# Patient Record
Sex: Male | Born: 1945 | Race: White | Hispanic: No | Marital: Single | Smoking: Never smoker
Health system: Southern US, Community
[De-identification: ages and names within clinical notes are randomized; demographics above are authoritative.]

## PROBLEM LIST (undated history)

## (undated) DIAGNOSIS — D649 Anemia, unspecified: Secondary | ICD-10-CM

## (undated) DIAGNOSIS — I428 Other cardiomyopathies: Secondary | ICD-10-CM

## (undated) DIAGNOSIS — I5022 Chronic systolic (congestive) heart failure: Secondary | ICD-10-CM

## (undated) DIAGNOSIS — K5730 Diverticulosis of large intestine without perforation or abscess without bleeding: Secondary | ICD-10-CM

## (undated) DIAGNOSIS — K529 Noninfective gastroenteritis and colitis, unspecified: Secondary | ICD-10-CM

## (undated) DIAGNOSIS — I5042 Chronic combined systolic (congestive) and diastolic (congestive) heart failure: Secondary | ICD-10-CM

## (undated) DIAGNOSIS — I2510 Atherosclerotic heart disease of native coronary artery without angina pectoris: Secondary | ICD-10-CM

## (undated) DIAGNOSIS — F8081 Childhood onset fluency disorder: Secondary | ICD-10-CM

## (undated) DIAGNOSIS — K631 Perforation of intestine (nontraumatic): Secondary | ICD-10-CM

## (undated) HISTORY — PX: APPENDECTOMY: SHX54

## (undated) HISTORY — DX: Anemia, unspecified: D64.9

## (undated) HISTORY — DX: Chronic systolic (congestive) heart failure: I50.22

## (undated) HISTORY — DX: Atherosclerotic heart disease of native coronary artery without angina pectoris: I25.10

## (undated) HISTORY — DX: Other cardiomyopathies: I42.8

## (undated) HISTORY — DX: Perforation of intestine (nontraumatic): K63.1

## (undated) HISTORY — DX: Noninfective gastroenteritis and colitis, unspecified: K52.9

## (undated) HISTORY — PX: EYE SURGERY: SHX253

## (undated) HISTORY — DX: Diverticulosis of large intestine without perforation or abscess without bleeding: K57.30

## (undated) HISTORY — DX: Chronic combined systolic (congestive) and diastolic (congestive) heart failure: I50.42

---

## 2008-06-15 ENCOUNTER — Emergency Department: Payer: Self-pay | Admitting: *Deleted

## 2008-09-08 ENCOUNTER — Ambulatory Visit: Payer: Self-pay | Admitting: Ophthalmology

## 2009-09-28 ENCOUNTER — Ambulatory Visit: Payer: Self-pay | Admitting: Ophthalmology

## 2016-03-09 ENCOUNTER — Inpatient Hospital Stay
Admission: EM | Admit: 2016-03-09 | Discharge: 2016-03-11 | DRG: 392 | Disposition: A | Discharge status: Home / Self Care | Payer: Medicare Other | Attending: Internal Medicine | Admitting: Internal Medicine

## 2016-03-09 DIAGNOSIS — R109 Unspecified abdominal pain: Secondary | ICD-10-CM | POA: Diagnosis present

## 2016-03-09 DIAGNOSIS — K5720 Diverticulitis of large intestine with perforation and abscess without bleeding: Secondary | ICD-10-CM

## 2016-03-09 DIAGNOSIS — R319 Hematuria, unspecified: Secondary | ICD-10-CM | POA: Diagnosis present

## 2016-03-09 DIAGNOSIS — K529 Noninfective gastroenteritis and colitis, unspecified: Secondary | ICD-10-CM | POA: Diagnosis not present

## 2016-03-09 DIAGNOSIS — E860 Dehydration: Secondary | ICD-10-CM | POA: Diagnosis present

## 2016-03-09 DIAGNOSIS — Z23 Encounter for immunization: Secondary | ICD-10-CM

## 2016-03-09 DIAGNOSIS — N390 Urinary tract infection, site not specified: Secondary | ICD-10-CM | POA: Diagnosis present

## 2016-03-09 DIAGNOSIS — Z809 Family history of malignant neoplasm, unspecified: Secondary | ICD-10-CM

## 2016-03-09 LAB — CBC
HCT: 46.8 % (ref 40.0–52.0)
Hemoglobin: 15.5 g/dL (ref 13.0–18.0)
MCH: 28.3 pg (ref 26.0–34.0)
MCHC: 33 g/dL (ref 32.0–36.0)
MCV: 85.9 fL (ref 80.0–100.0)
PLATELETS: 317 10*3/uL (ref 150–440)
RBC: 5.45 MIL/uL (ref 4.40–5.90)
RDW: 13.4 % (ref 11.5–14.5)
WBC: 14.1 10*3/uL — AB (ref 3.8–10.6)

## 2016-03-09 LAB — COMPREHENSIVE METABOLIC PANEL
ALK PHOS: 70 U/L (ref 38–126)
ALT: 54 U/L (ref 17–63)
AST: 48 U/L — AB (ref 15–41)
Albumin: 3.9 g/dL (ref 3.5–5.0)
Anion gap: 10 (ref 5–15)
BILIRUBIN TOTAL: 1.4 mg/dL — AB (ref 0.3–1.2)
BUN: 44 mg/dL — AB (ref 6–20)
CHLORIDE: 105 mmol/L (ref 101–111)
CO2: 24 mmol/L (ref 22–32)
CREATININE: 1.46 mg/dL — AB (ref 0.61–1.24)
Calcium: 9 mg/dL (ref 8.9–10.3)
GFR calc Af Amer: 55 mL/min — ABNORMAL LOW (ref >60)
GFR, EST NON AFRICAN AMERICAN: 47 mL/min — AB (ref >60)
Glucose, Bld: 148 mg/dL — ABNORMAL HIGH (ref 65–99)
Potassium: 4.4 mmol/L (ref 3.5–5.1)
Sodium: 139 mmol/L (ref 135–145)
TOTAL PROTEIN: 7.8 g/dL (ref 6.5–8.1)

## 2016-03-09 LAB — LIPASE, BLOOD: Lipase: 17 U/L (ref 11–51)

## 2016-03-09 NOTE — ED Notes (Signed)
Reports several episodes over the past several weeks with abdominal pain, vomiting and "belching".  Reports this episode has been going on for several days.  Patient has been exposed to the flu.

## 2016-03-09 NOTE — ED Notes (Signed)
Patient called for triage by this RN. No answer. Will reattempt.

## 2016-03-09 NOTE — ED Notes (Signed)
Patient called the second time for triage. No response. RN will reattempt to call patient one final time prior to eloping from the status board.

## 2016-03-10 ENCOUNTER — Emergency Department: Payer: Medicare Other

## 2016-03-10 DIAGNOSIS — E860 Dehydration: Secondary | ICD-10-CM | POA: Diagnosis present

## 2016-03-10 DIAGNOSIS — Z23 Encounter for immunization: Secondary | ICD-10-CM | POA: Diagnosis not present

## 2016-03-10 DIAGNOSIS — R319 Hematuria, unspecified: Secondary | ICD-10-CM | POA: Diagnosis present

## 2016-03-10 DIAGNOSIS — K529 Noninfective gastroenteritis and colitis, unspecified: Secondary | ICD-10-CM | POA: Diagnosis present

## 2016-03-10 DIAGNOSIS — R109 Unspecified abdominal pain: Secondary | ICD-10-CM | POA: Diagnosis present

## 2016-03-10 DIAGNOSIS — Z809 Family history of malignant neoplasm, unspecified: Secondary | ICD-10-CM | POA: Diagnosis not present

## 2016-03-10 DIAGNOSIS — N390 Urinary tract infection, site not specified: Secondary | ICD-10-CM | POA: Diagnosis present

## 2016-03-10 LAB — URINALYSIS COMPLETE WITH MICROSCOPIC (ARMC ONLY)
BILIRUBIN URINE: NEGATIVE
GLUCOSE, UA: NEGATIVE mg/dL
Ketones, ur: NEGATIVE mg/dL
Nitrite: POSITIVE — AB
PH: 6 (ref 5.0–8.0)
Protein, ur: 30 mg/dL — AB
Specific Gravity, Urine: 1.051 — ABNORMAL HIGH (ref 1.005–1.030)

## 2016-03-10 LAB — RAPID INFLUENZA A&B ANTIGENS (ARMC ONLY)
INFLUENZA A (ARMC): NEGATIVE
INFLUENZA B (ARMC): NEGATIVE

## 2016-03-10 MED ORDER — PIPERACILLIN-TAZOBACTAM 3.375 G IVPB
3.3750 g | Freq: Once | INTRAVENOUS | Status: AC
  Administered 2016-03-10: 3.375 g via INTRAVENOUS
  Filled 2016-03-10: qty 50

## 2016-03-10 MED ORDER — METOCLOPRAMIDE HCL 5 MG/ML IJ SOLN
10.0000 mg | Freq: Once | INTRAMUSCULAR | Status: AC
  Administered 2016-03-10: 10 mg via INTRAVENOUS
  Filled 2016-03-10: qty 2

## 2016-03-10 MED ORDER — SODIUM CHLORIDE 0.9% FLUSH
3.0000 mL | Freq: Two times a day (BID) | INTRAVENOUS | Status: DC
  Administered 2016-03-11: 3 mL via INTRAVENOUS

## 2016-03-10 MED ORDER — CIPROFLOXACIN IN D5W 400 MG/200ML IV SOLN
400.0000 mg | Freq: Two times a day (BID) | INTRAVENOUS | Status: DC
  Administered 2016-03-10 – 2016-03-11 (×3): 400 mg via INTRAVENOUS
  Filled 2016-03-10 (×4): qty 200

## 2016-03-10 MED ORDER — MORPHINE SULFATE (PF) 4 MG/ML IV SOLN
4.0000 mg | Freq: Once | INTRAVENOUS | Status: AC
  Administered 2016-03-10: 4 mg via INTRAVENOUS
  Filled 2016-03-10: qty 1

## 2016-03-10 MED ORDER — PNEUMOCOCCAL VAC POLYVALENT 25 MCG/0.5ML IJ INJ
0.5000 mL | INJECTION | INTRAMUSCULAR | Status: AC
  Administered 2016-03-11: 0.5 mL via INTRAMUSCULAR
  Filled 2016-03-10: qty 0.5

## 2016-03-10 MED ORDER — ACETAMINOPHEN 650 MG RE SUPP: 650.0000 mg | Freq: Four times a day (QID) | RECTAL | Status: DC | PRN

## 2016-03-10 MED ORDER — ACETAMINOPHEN 325 MG PO TABS: 650.0000 mg | ORAL_TABLET | Freq: Four times a day (QID) | ORAL | Status: DC | PRN

## 2016-03-10 MED ORDER — ONDANSETRON HCL 4 MG/2ML IJ SOLN
4.0000 mg | Freq: Four times a day (QID) | INTRAMUSCULAR | Status: DC | PRN
  Administered 2016-03-10: 4 mg via INTRAVENOUS
  Filled 2016-03-10: qty 2

## 2016-03-10 MED ORDER — SODIUM CHLORIDE 0.9 % IV SOLN
Freq: Once | INTRAVENOUS | Status: AC
  Administered 2016-03-10: via INTRAVENOUS

## 2016-03-10 MED ORDER — ONDANSETRON HCL 4 MG PO TABS: 4.0000 mg | ORAL_TABLET | Freq: Four times a day (QID) | ORAL | Status: DC | PRN

## 2016-03-10 MED ORDER — METRONIDAZOLE IN NACL 5-0.79 MG/ML-% IV SOLN
500.0000 mg | Freq: Three times a day (TID) | INTRAVENOUS | Status: DC
  Administered 2016-03-10 – 2016-03-11 (×4): 500 mg via INTRAVENOUS
  Filled 2016-03-10 (×7): qty 100

## 2016-03-10 MED ORDER — INFLUENZA VAC SPLIT QUAD 0.5 ML IM SUSY
0.5000 mL | PREFILLED_SYRINGE | INTRAMUSCULAR | Status: AC
  Administered 2016-03-11: 0.5 mL via INTRAMUSCULAR
  Filled 2016-03-10: qty 0.5

## 2016-03-10 MED ORDER — IOHEXOL 240 MG/ML SOLN
25.0000 mL | Freq: Once | INTRAMUSCULAR | Status: AC | PRN
  Administered 2016-03-10: 25 mL via ORAL

## 2016-03-10 MED ORDER — SODIUM CHLORIDE 0.9 % IV SOLN
INTRAVENOUS | Status: DC
  Administered 2016-03-10 – 2016-03-11 (×3): via INTRAVENOUS

## 2016-03-10 MED ORDER — IOHEXOL 300 MG/ML  SOLN
80.0000 mL | Freq: Once | INTRAMUSCULAR | Status: AC | PRN
  Administered 2016-03-10: 80 mL via INTRAVENOUS

## 2016-03-10 MED ORDER — ONDANSETRON HCL 4 MG/2ML IJ SOLN
4.0000 mg | Freq: Once | INTRAMUSCULAR | Status: AC
  Administered 2016-03-10: 4 mg via INTRAVENOUS
  Filled 2016-03-10: qty 2

## 2016-03-10 MED ORDER — MORPHINE SULFATE (PF) 2 MG/ML IV SOLN: 2.0000 mg | INTRAVENOUS | Status: DC | PRN

## 2016-03-10 NOTE — ED Notes (Signed)
Patient transported to CT 

## 2016-03-10 NOTE — ED Notes (Signed)
CT techs notified that pt finished drinking contrast.

## 2016-03-10 NOTE — ED Provider Notes (Signed)
[facility] Emergency Department Provider Note  ____________________________________________  Time seen: Approximately 12:11 AM  I have reviewed the triage vital signs and the nursing notes.   HISTORY  Chief Complaint Abdominal Pain and Emesis    HPI William Holland is a 70 y.o. male who complains of abdominal pain off and on for about a week vomiting occasional cough and diarrhea chills some blood in the urine as well. Patient has been unable to keep anything down for at least 24 hours. Brother provides most of the history since says the patient last Food down on Monday. His last stool was yesterday. Pain is supposed to be 10 out of 10 left-sided abdominal pain and middle abdominal pain   History reviewed. No pertinent past medical history. Patient with no past medical history no hypertension diabetes asthma etc. Patient Active Problem List   Diagnosis Date Noted  . Abdominal pain 03/10/2016  . Colitis 03/10/2016    Past Surgical History  Procedure Laterality Date  . Appendectomy    . Eye surgery      Current Outpatient Rx  Name  Route  Sig  Dispense  Refill  . acetaminophen (TYLENOL) 325 MG tablet   Oral   Take 650 mg by mouth every 6 (six) hours as needed.         . ciprofloxacin (CIPRO) 500 MG tablet   Oral   Take 1 tablet (500 mg total) by mouth 2 (two) times daily.   16 tablet   0   . metroNIDAZOLE (FLAGYL) 500 MG tablet   Oral   Take 1 tablet (500 mg total) by mouth every 8 (eight) hours.   24 tablet   0     Allergies Review of patient's allergies indicates no known allergies.  Family History  Problem Relation Age of Onset  . Cancer Brother   . Cancer Father    Family history is significant for renal stones. Social History Social History  Substance Use Topics  . Smoking status: Never Smoker   . Smokeless tobacco: None  . Alcohol Use: No   patient is currently living with the brother's son. He occasionally does live with  the brother. Brother is here with him now.  Review of Systems Constitutional:fever/chills Eyes: No visual changes. ENT: No sore throat. Cardiovascular: Denies chest pain. Respiratory: Denies shortness of breath. Gastrointestinal: See history of present illness Genitourinary: See history of present illness Musculoskeletal: Negative for back pain. Skin: Negative for rash. Neurological: Negative for headaches, focal weakness or numbness.  10-point ROS otherwise negative.  ____________________________________________   PHYSICAL EXAM:  VITAL SIGNS: ED Triage Vitals  Enc Vitals Group     BP 03/09/16 1915 118/81 mmHg     Pulse Rate 03/09/16 1915 117     Resp 03/09/16 1915 24     Temp 03/09/16 1915 97.6 F (36.4 C)     Temp Source 03/09/16 1915 Oral     SpO2 03/09/16 1920 94 %     Weight 03/09/16 1915 210 lb (95.255 kg)     Height 03/09/16 1915 5' 6"  (1.676 m)     Head Cir --      Peak Flow --      Pain Score 03/09/16 1917 10     Pain Loc --      Pain Edu? --      Excl. in [place]? --     Constitutional: Alert and oriented. Well appearing and in no acute distress. Eyes: Conjunctivae are normal. PERRL. EOMI.  Head: Atraumatic. Nose: No congestion/rhinnorhea. Mouth/Throat: Mucous membranes are dry.  Oropharynx non-erythematous. Neck: No stridor.  Cardiovascular: Normal rate, regular rhythm. Grossly normal heart sounds.  Good peripheral circulation. Respiratory: Normal respiratory effort.  No retractions. Lungs CTAB. Gastrointestinal: Soft bowel sounds somewhat decreased is tenderness to palpation percussion in the middle and left abdomen. No distention. No abdominal bruits. No CVA tenderness. Musculoskeletal: No lower extremity tenderness nor edema.  No joint effusions. Neurologic:  Normal speech and language. No gross focal neurologic deficits are appreciated. No gait instability. Skin:  Skin is warm, dry and intact. No rash  noted.   ____________________________________________   LABS (all labs ordered are listed, but only abnormal results are displayed)  Labs Reviewed  COMPREHENSIVE METABOLIC PANEL - Abnormal; Notable for the following:    Glucose, Bld 148 (*)    BUN 44 (*)    Creatinine, Ser 1.46 (*)    AST 48 (*)    Total Bilirubin 1.4 (*)    GFR calc non Af Amer 47 (*)    GFR calc Af Amer 55 (*)    All other components within normal limits  CBC - Abnormal; Notable for the following:    WBC 14.1 (*)    All other components within normal limits  URINALYSIS COMPLETEWITH MICROSCOPIC ([facility] ONLY) - Abnormal; Notable for the following:    Color, Urine AMBER (*)    APPearance TURBID (*)    Specific Gravity, Urine 1.051 (*)    Hgb urine dipstick 1+ (*)    Protein, ur 30 (*)    Nitrite POSITIVE (*)    Leukocytes, UA 3+ (*)    Bacteria, UA MANY (*)    Squamous Epithelial / LPF 0-5 (*)    All other components within normal limits  BASIC METABOLIC PANEL - Abnormal; Notable for the following:    Glucose, Bld 113 (*)    BUN 37 (*)    Calcium 7.8 (*)    GFR calc non Af Amer 58 (*)    Anion gap 2 (*)    All other components within normal limits  CBC - Abnormal; Notable for the following:    RBC 4.20 (*)    Hemoglobin 12.4 (*)    HCT 36.4 (*)    All other components within normal limits  RAPID INFLUENZA A&B ANTIGENS ([facility] ONLY)  URINE CULTURE  LIPASE, BLOOD   ____________________________________________  EKG   ____________________________________________  RADIOLOGY  IMPRESSION: 1. Apparent mild more focal wall thickening along the proximal to mid sigmoid colon, with vague adjacent nodular density in the overlying fat, measuring approximately 3.2 cm. An underlying mass cannot be excluded. Colonoscopy would be helpful for further evaluation. 2. Suggestion of wall thickening along the descending and proximal sigmoid colon, which may reflect an infectious or inflammatory colitis. Scattered  diverticulosis along the descending and sigmoid colon. 3. Small bilateral renal cysts noted. 4. Mildly enlarged prostate. 5. Small amount of air within the bladder may reflect prior Foley catheter placement. Several small stones noted within the bladder. ____________________________________________   PROCEDURES    ____________________________________________   INITIAL IMPRESSION / ASSESSMENT AND PLAN / ED COURSE  Pertinent labs & imaging results that were available during my care of the patient were reviewed by me and considered in my medical decision making (see chart for details).   ____________________________________________   FINAL CLINICAL IMPRESSION(S) / ED DIAGNOSES  Final diagnoses:  Urinary tract infection with hematuria, site unspecified  Diverticulitis of large intestine with abscess without bleeding  Dehydration  Nena Polio, MD 03/13/16 [phone]

## 2016-03-10 NOTE — Plan of Care (Signed)
Problem: Safety: Goal: Ability to remain free from injury will improve Outcome: Progressing Bed alarm on. Pt developmentally delayed. repetitive education on using call bell  Problem: Pain Managment: Goal: General experience of comfort will improve Outcome: Progressing Pt states he is feeling better today  Problem: Fluid Volume: Goal: Ability to maintain a balanced intake and output will improve Outcome: Progressing IVF infusing  Problem: Nutrition: Goal: Adequate nutrition will be maintained Outcome: Not Progressing PT NPO

## 2016-03-10 NOTE — H&P (Signed)
[facility] [place] NAME: William Holland    MR#:  [id number]  DATE OF BIRTH:  1946-07-10  DATE OF ADMISSION:  03/09/2016  PRIMARY CARE PHYSICIAN: No primary care provider on file.   REQUESTING/REFERRING PHYSICIAN:   CHIEF COMPLAINT:   Chief Complaint  Patient presents with  . Abdominal Pain  . Emesis    HISTORY OF PRESENT ILLNESS: Kennet Mccort  is a 70 y.o. male with no significant past medical history presented to the emergency room with abdominal pain and vomiting since last 4 days. Patient has lower abdominal pain which is aching in nature 7 out of 10 on a scale of 1-10. Patient also has nausea and vomiting and diarrhea for the last couple of days. Vomitus contains food particles and water. Diarrhea is watery stool no blood in the stool. Patient also complains of some blood in the urine. No history of any kidney stones. No history of fever or chills. No history of any chest pain or shortness of breath. Patient was evaluated in the emergency room with CT abdomen which showed sigmoid colitis. No history of recent travel or sick contacts at home.  PAST MEDICAL HISTORY:  History reviewed. No pertinent past medical history.  PAST SURGICAL HISTORY: Past Surgical History  Procedure Laterality Date  . Appendectomy    . Eye surgery      SOCIAL HISTORY:  Social History  Substance Use Topics  . Smoking status: Never Smoker   . Smokeless tobacco: Not on file  . Alcohol Use: No    FAMILY HISTORY:  Family History  Problem Relation Age of Onset  . Cancer Brother   . Cancer Father     DRUG ALLERGIES: No Known Allergies  REVIEW OF SYSTEMS:   CONSTITUTIONAL: No fever, has weakness.  EYES: No blurred or double vision.  EARS, NOSE, AND THROAT: No tinnitus or ear pain.  RESPIRATORY: No cough, shortness of breath, wheezing or hemoptysis.  CARDIOVASCULAR: No chest pain, orthopnea, edema.  GASTROINTESTINAL: Has nausea, vomiting, diarrhea and  abdominal pain.  GENITOURINARY: No dysuria, has hematuria.  ENDOCRINE: No polyuria, nocturia,  HEMATOLOGY: No anemia, easy bruising or bleeding SKIN: No rash or lesion. MUSCULOSKELETAL: No joint pain or arthritis.   NEUROLOGIC: No tingling, numbness, weakness.  PSYCHIATRY: No anxiety or depression.   MEDICATIONS AT HOME:  Prior to Admission medications   Medication Sig Start Date End Date Taking? Authorizing Provider  acetaminophen (TYLENOL) 325 MG tablet Take 650 mg by mouth every 6 (six) hours as needed.   Yes Historical Provider, MD      PHYSICAL EXAMINATION:   VITAL SIGNS: Blood pressure 143/63, pulse 100, temperature 98.5 F (36.9 C), temperature source Oral, resp. rate 13, height 5' 6"  (1.676 m), weight 95.255 kg (210 lb), SpO2 94 %.  GENERAL:  70 y.o.-year-old patient lying in the bed with no acute distress.  EYES: Pupils equal, round, reactive to light and accommodation. No scleral icterus. Extraocular muscles intact.  HEENT: Head atraumatic, normocephalic. Oropharynx dry and nasopharynx clear.  NECK:  Supple, no jugular venous distention. No thyroid enlargement, no tenderness.  LUNGS: Normal breath sounds bilaterally, no wheezing, rales,rhonchi or crepitation. No use of accessory muscles of respiration.  CARDIOVASCULAR: S1, S2 normal. No murmurs, rubs, or gallops.  ABDOMEN: Soft, tenderness in the lower abdomen, nondistended. Bowel sounds present. No organomegaly or mass.  EXTREMITIES: No pedal edema, cyanosis, or clubbing.  NEUROLOGIC: Cranial nerves II through XII are intact. Muscle strength 5/5  in all extremities. Sensation intact. Gait not checked. No cerebellar signs noted. PSYCHIATRIC: The patient is alert and oriented x 3.  SKIN: No obvious rash, lesion, or ulcer.   LABORATORY PANEL:   CBC  Recent Labs Lab 03/09/16 1920  WBC 14.1*  HGB 15.5  HCT 46.8  PLT 317  MCV 85.9  MCH 28.3  MCHC 33.0  RDW 13.4    ------------------------------------------------------------------------------------------------------------------  Chemistries   Recent Labs Lab 03/09/16 1920  NA 139  K 4.4  CL 105  CO2 24  GLUCOSE 148*  BUN 44*  CREATININE 1.46*  CALCIUM 9.0  AST 48*  ALT 54  ALKPHOS 70  BILITOT 1.4*   ------------------------------------------------------------------------------------------------------------------ estimated creatinine clearance is 51.6 mL/min (by C-G formula based on Cr of 1.46). ------------------------------------------------------------------------------------------------------------------ No results for input(s): TSH, T4TOTAL, T3FREE, THYROIDAB in the last 72 hours.  Invalid input(s): FREET3   Coagulation profile No results for input(s): INR, PROTIME in the last 168 hours. ------------------------------------------------------------------------------------------------------------------- No results for input(s): DDIMER in the last 72 hours. -------------------------------------------------------------------------------------------------------------------  Cardiac Enzymes No results for input(s): CKMB, TROPONINI, MYOGLOBIN in the last 168 hours.  Invalid input(s): CK ------------------------------------------------------------------------------------------------------------------ Invalid input(s): POCBNP  ---------------------------------------------------------------------------------------------------------------  Urinalysis    Component Value Date/Time   COLORURINE AMBER* 03/10/2016 0236   APPEARANCEUR TURBID* 03/10/2016 0236   LABSPEC 1.051* 03/10/2016 0236   PHURINE 6.0 03/10/2016 0236   GLUCOSEU NEGATIVE 03/10/2016 0236   HGBUR 1+* 03/10/2016 0236   BILIRUBINUR NEGATIVE 03/10/2016 0236   KETONESUR NEGATIVE 03/10/2016 0236   PROTEINUR 30* 03/10/2016 0236   NITRITE POSITIVE* 03/10/2016 0236   LEUKOCYTESUR 3+* 03/10/2016 0236     RADIOLOGY: Ct  Abdomen Pelvis W Contrast  03/10/2016  CLINICAL DATA:  Acute onset of generalized abdominal pain, vomiting and belching. Leukocytosis. Initial encounter. EXAM: CT ABDOMEN AND PELVIS WITH CONTRAST TECHNIQUE: Multidetector CT imaging of the abdomen and pelvis was performed using the standard protocol following bolus administration of intravenous contrast. CONTRAST:  86m OMNIPAQUE IOHEXOL 300 MG/ML  SOLN COMPARISON:  None. FINDINGS: The visualized lung bases are clear. The liver and spleen are unremarkable in appearance. The gallbladder is within normal limits. The pancreas and adrenal glands are unremarkable. A 1.3 cm cyst is noted at the interpole region of the left kidney, with a few small left renal peripelvic cysts. A few small right renal cysts are also seen. There is no evidence of hydronephrosis. No renal or ureteral stones are seen. No perinephric stranding is appreciated. No free fluid is identified. The small bowel is unremarkable in appearance. The stomach is within normal limits. No acute vascular abnormalities are seen. The appendix is not definitely characterized; there is no evidence of appendicitis. The rectum and colon are partially filled with air and stool. There is suggestion of wall thickening along the descending and proximal sigmoid colon, which may reflect an infectious or inflammatory colitis. Scattered diverticulosis is noted along the descending and sigmoid colon. Vague nodular density is noted adjacent to the proximal to mid sigmoid colon, measuring approximately 3.2 cm, with associated soft tissue inflammation and question of mild more focal wall thickening. An underlying mass cannot be excluded. The bladder is mildly distended and contains a small amount of air, possibly reflecting prior Foley catheter placement. A few stones are seen dependently within the bladder. The prostate is mildly enlarged, measuring 5.2 cm in transverse dimension. No inguinal lymphadenopathy is seen. No acute  osseous abnormalities are identified. IMPRESSION: 1. Apparent mild more focal wall thickening along the proximal to mid sigmoid  colon, with vague adjacent nodular density in the overlying fat, measuring approximately 3.2 cm. An underlying mass cannot be excluded. Colonoscopy would be helpful for further evaluation. 2. Suggestion of wall thickening along the descending and proximal sigmoid colon, which may reflect an infectious or inflammatory colitis. Scattered diverticulosis along the descending and sigmoid colon. 3. Small bilateral renal cysts noted. 4. Mildly enlarged prostate. 5. Small amount of air within the bladder may reflect prior Foley catheter placement. Several small stones noted within the bladder. Electronically Signed   By: Garald Balding M.D.   On: 03/10/2016 02:19    EKG: No orders found for this or any previous visit.  IMPRESSION AND PLAN: 70 year old male patient with no significant past medical history presented to the emergency room with abdominal pain, nausea, vomiting and diarrhea. Patient also has some hematuria. Admitting diagnosis 1. Abdominal pain 2. Acute colitis 3. Intractable nausea vomiting 4. Dehydration 5. Leukocytosis 6. Hematuria Treatment plan Admit patient to medical floor Start patient on IV Flagyl and IV ciprofloxacin antibiotic IV fluid hydration Keep patient nothing by mouth for now DVT prophylaxis sequential compression device to both lower extremities Avoid blood thinner medication Antiemetics Pain management Supportive care.  All the records are reviewed and case discussed with ED provider. Management plans discussed with the patient, family and they are in agreement.  CODE STATUS:FULL Code Status History    This patient does not have a recorded code status. Please follow your organizational policy for patients in this situation.       TOTAL TIME TAKING CARE OF THIS PATIENT: 55mnutes.    PSaundra ShellingM.D on 03/10/2016 at 5:15  AM  Between 7am to 6pm - Pager - [phone]  After 6pm go to [email or web address] - password [email or web address] [facility]ospital[facility]  Office  [phone] CC: Primary care physician; No primary care provider on file.

## 2016-03-10 NOTE — Progress Notes (Addendum)
Patient ID: William Holland, male   DOB: September 13, 1946, 70 y.o.   MRN: [id number] [facility] NAME: William Holland    MR#:  [id number]  DATE OF BIRTH:  [birth year]  SUBJECTIVE:   Came in with vomiting and abdominal pain REVIEW OF SYSTEMS:   Review of Systems  Constitutional: Negative for fever, chills and weight loss.  HENT: Negative for ear discharge, ear pain and nosebleeds.   Eyes: Negative for blurred vision, pain and discharge.  Respiratory: Negative for sputum production, shortness of breath, wheezing and stridor.   Cardiovascular: Negative for chest pain, palpitations, orthopnea and PND.  Gastrointestinal: Positive for nausea and abdominal pain. Negative for vomiting and diarrhea.  Genitourinary: Negative for urgency and frequency.  Musculoskeletal: Negative for back pain and joint pain.  Neurological: Negative for sensory change, speech change, focal weakness and weakness.  Psychiatric/Behavioral: Negative for depression and hallucinations. The patient is not nervous/anxious.    Tolerating Diet:cld Tolerating PT: not needed  DRUG ALLERGIES:  No Known Allergies  VITALS:  Blood pressure 126/61, pulse 99, temperature 97.9 F (36.6 C), temperature source Oral, resp. rate 18, height 5' 6"  (1.676 m), weight 95.255 kg (210 lb), SpO2 97 %.  PHYSICAL EXAMINATION:   Physical Exam  GENERAL:  70 y.o.-year-old patient lying in the bed with no acute distress.  EYES: Pupils equal, round, reactive to light and accommodation. No scleral icterus. Extraocular muscles intact.  HEENT: Head atraumatic, normocephalic. Oropharynx and nasopharynx clear.  NECK:  Supple, no jugular venous distention. No thyroid enlargement, no tenderness.  LUNGS: Normal breath sounds bilaterally, no wheezing, rales, rhonchi. No use of accessory muscles of respiration.  CARDIOVASCULAR: S1, S2 normal. No murmurs, rubs, or gallops.  ABDOMEN: Soft, nontender,  nondistended. Bowel sounds present. No organomegaly or mass.  EXTREMITIES: No cyanosis, clubbing or edema b/l.    NEUROLOGIC: Cranial nerves II through XII are intact. No focal Motor or sensory deficits b/l.   PSYCHIATRIC:  patient is alert and oriented x 3.  SKIN: No obvious rash, lesion, or ulcer.   LABORATORY PANEL:  CBC  Recent Labs Lab 03/09/16 1920  WBC 14.1*  HGB 15.5  HCT 46.8  PLT 317    Chemistries   Recent Labs Lab 03/09/16 1920  NA 139  K 4.4  CL 105  CO2 24  GLUCOSE 148*  BUN 44*  CREATININE 1.46*  CALCIUM 9.0  AST 48*  ALT 54  ALKPHOS 70  BILITOT 1.4*   Cardiac Enzymes No results for input(s): TROPONINI in the last 168 hours. RADIOLOGY:  Ct Abdomen Pelvis W Contrast  03/10/2016  CLINICAL DATA:  Acute onset of generalized abdominal pain, vomiting and belching. Leukocytosis. Initial encounter. EXAM: CT ABDOMEN AND PELVIS WITH CONTRAST TECHNIQUE: Multidetector CT imaging of the abdomen and pelvis was performed using the standard protocol following bolus administration of intravenous contrast. CONTRAST:  83m OMNIPAQUE IOHEXOL 300 MG/ML  SOLN COMPARISON:  None. FINDINGS: The visualized lung bases are clear. The liver and spleen are unremarkable in appearance. The gallbladder is within normal limits. The pancreas and adrenal glands are unremarkable. A 1.3 cm cyst is noted at the interpole region of the left kidney, with a few small left renal peripelvic cysts. A few small right renal cysts are also seen. There is no evidence of hydronephrosis. No renal or ureteral stones are seen. No perinephric stranding is appreciated. No free fluid is identified. The small bowel is unremarkable in appearance. The stomach is  within normal limits. No acute vascular abnormalities are seen. The appendix is not definitely characterized; there is no evidence of appendicitis. The rectum and colon are partially filled with air and stool. There is suggestion of wall thickening along the  descending and proximal sigmoid colon, which may reflect an infectious or inflammatory colitis. Scattered diverticulosis is noted along the descending and sigmoid colon. Vague nodular density is noted adjacent to the proximal to mid sigmoid colon, measuring approximately 3.2 cm, with associated soft tissue inflammation and question of mild more focal wall thickening. An underlying mass cannot be excluded. The bladder is mildly distended and contains a small amount of air, possibly reflecting prior Foley catheter placement. A few stones are seen dependently within the bladder. The prostate is mildly enlarged, measuring 5.2 cm in transverse dimension. No inguinal lymphadenopathy is seen. No acute osseous abnormalities are identified. IMPRESSION: 1. Apparent mild more focal wall thickening along the proximal to mid sigmoid colon, with vague adjacent nodular density in the overlying fat, measuring approximately 3.2 cm. An underlying mass cannot be excluded. Colonoscopy would be helpful for further evaluation. 2. Suggestion of wall thickening along the descending and proximal sigmoid colon, which may reflect an infectious or inflammatory colitis. Scattered diverticulosis along the descending and sigmoid colon. 3. Small bilateral renal cysts noted. 4. Mildly enlarged prostate. 5. Small amount of air within the bladder may reflect prior Foley catheter placement. Several small stones noted within the bladder. Electronically Signed   By: Garald Balding M.D.   On: 03/10/2016 02:19   ASSESSMENT AND PLAN:  70 year old male patient with no significant past medical history presented to the emergency room with abdominal pain, nausea, vomiting and diarrhea. Patient also has some hematuria.  1. Acute colitis of descending and Sigmoid colon -IV cipro and flagyl -liquid diet  2. Intractable nausea vomiting -IVF and prn antiemetics  3. Dehydration improving -cont po fluids  4. Leukocytosis due to UTI and colitis  5.  UTI cipro should suffice  Case discussed with Care Management/Social Worker. Management plans discussed with the patient, family and they are in agreement.  CODE STATUS: full  DVT Prophylaxis: lovenox  TOTAL TIME TAKING CARE OF THIS PATIENT: 30 minutes.  >50% time spent on counselling and coordination of care  POSSIBLE D/C IN 2-3 DAYS, DEPENDING ON CLINICAL CONDITION.  Note: This dictation was prepared with Dragon dictation along with smaller phrase technology. Any transcriptional errors that result from this process are unintentional.  Yenni Carra M.D on 03/10/2016 at 3:07 PM  Between 7am to 6pm - Pager - [phone]  After 6pm go to [email or web address] - password [email or web address] [facility]  [facility]  Office  [phone]  CC: Primary care physician; No primary care provider on file.

## 2016-03-11 DIAGNOSIS — K529 Noninfective gastroenteritis and colitis, unspecified: Secondary | ICD-10-CM | POA: Diagnosis not present

## 2016-03-11 LAB — BASIC METABOLIC PANEL
ANION GAP: 2 — AB (ref 5–15)
BUN: 37 mg/dL — ABNORMAL HIGH (ref 6–20)
CHLORIDE: 111 mmol/L (ref 101–111)
CO2: 25 mmol/L (ref 22–32)
CREATININE: 1.23 mg/dL (ref 0.61–1.24)
Calcium: 7.8 mg/dL — ABNORMAL LOW (ref 8.9–10.3)
GFR calc non Af Amer: 58 mL/min — ABNORMAL LOW (ref >60)
Glucose, Bld: 113 mg/dL — ABNORMAL HIGH (ref 65–99)
POTASSIUM: 4.1 mmol/L (ref 3.5–5.1)
SODIUM: 138 mmol/L (ref 135–145)

## 2016-03-11 LAB — CBC
HCT: 36.4 % — ABNORMAL LOW (ref 40.0–52.0)
HEMOGLOBIN: 12.4 g/dL — AB (ref 13.0–18.0)
MCH: 29.5 pg (ref 26.0–34.0)
MCHC: 34.1 g/dL (ref 32.0–36.0)
MCV: 86.7 fL (ref 80.0–100.0)
PLATELETS: 214 10*3/uL (ref 150–440)
RBC: 4.2 MIL/uL — AB (ref 4.40–5.90)
RDW: 13.5 % (ref 11.5–14.5)
WBC: 6.9 10*3/uL (ref 3.8–10.6)

## 2016-03-11 MED ORDER — CIPROFLOXACIN HCL 500 MG PO TABS
500.0000 mg | ORAL_TABLET | Freq: Two times a day (BID) | ORAL | Status: DC
Start: 2016-03-11 — End: 2018-02-25

## 2016-03-11 MED ORDER — METRONIDAZOLE 500 MG PO TABS: 500.0000 mg | ORAL_TABLET | Freq: Three times a day (TID) | ORAL | Status: DC

## 2016-03-11 MED ORDER — METRONIDAZOLE 500 MG PO TABS
500.0000 mg | ORAL_TABLET | Freq: Three times a day (TID) | ORAL | Status: DC
  Administered 2016-03-11: 500 mg via ORAL
  Filled 2016-03-11: qty 1

## 2016-03-11 MED ORDER — CIPROFLOXACIN HCL 500 MG PO TABS: 500.0000 mg | ORAL_TABLET | Freq: Two times a day (BID) | ORAL | Status: DC

## 2016-03-11 NOTE — Discharge Summary (Signed)
[facility] [place] NAME: William Holland    MR#:  [id number]  DATE OF BIRTH:  12/29/1945  DATE OF ADMISSION:  03/09/2016 ADMITTING PHYSICIAN: Saundra Shelling, MD  DATE OF DISCHARGE: 03/11/16  PRIMARY CARE PHYSICIAN: No primary care provider on file.    ADMISSION DIAGNOSIS:  Dehydration [E86.0] Diverticulitis of large intestine with abscess without bleeding [K57.20] Urinary tract infection with hematuria, site unspecified [N39.0, R31.9]  DISCHARGE DIAGNOSIS:  Acute sigmoid and descending colon diverticulitis UTI  SECONDARY DIAGNOSIS:  History reviewed. No pertinent past medical history.  HOSPITAL COURSE:  70 year old male patient with no significant past medical history presented to the emergency room with abdominal pain, nausea, vomiting and diarrhea. Patient also has some hematuria.  1. Acute colitis of descending and Sigmoid colon -IV cipro and flagyl---change to po -liquid diet  2. Intractable nausea vomiting -IVF and prn antiemetics -resolved tolerating po FLD  3. Dehydration improving -cont po fluids  4. Leukocytosis due to UTI and colitis -wbc normal  5. UTI cipro should suffice  If remains stable d/c later today Spoke with nephew Elisha Headland  CONSULTS OBTAINED:     DRUG ALLERGIES:  No Known Allergies  DISCHARGE MEDICATIONS:   Current Discharge Medication List    START taking these medications   Details  ciprofloxacin (CIPRO) 500 MG tablet Take 1 tablet (500 mg total) by mouth 2 (two) times daily. Qty: 16 tablet, Refills: 0    metroNIDAZOLE (FLAGYL) 500 MG tablet Take 1 tablet (500 mg total) by mouth every 8 (eight) hours. Qty: 24 tablet, Refills: 0      CONTINUE these medications which have NOT CHANGED   Details  acetaminophen (TYLENOL) 325 MG tablet Take 650 mg by mouth every 6 (six) hours as needed.        If you experience worsening of your admission symptoms, develop shortness of breath,  life threatening emergency, suicidal or homicidal thoughts you must seek medical attention immediately by calling 911 or calling your MD immediately  if symptoms less severe.  You Must read complete instructions/literature along with all the possible adverse reactions/side effects for all the Medicines you take and that have been prescribed to you. Take any new Medicines after you have completely understood and accept all the possible adverse reactions/side effects.   Please note  You were cared for by a hospitalist during your hospital stay. If you have any questions about your discharge medications or the care you received while you were in the hospital after you are discharged, you can call the unit and asked to speak with the hospitalist on call if the hospitalist that took care of you is not available. Once you are discharged, your primary care physician will handle any further medical issues. Please note that NO REFILLS for any discharge medications will be authorized once you are discharged, as it is imperative that you return to your primary care physician (or establish a relationship with a primary care physician if you do not have one) for your aftercare needs so that they can reassess your need for medications and monitor your lab values. Today   SUBJECTIVE  Had vomiting last pm Doing well this am No abd pain   VITAL SIGNS:  Blood pressure 125/54, pulse 100, temperature 99.1 F (37.3 C), temperature source Oral, resp. rate 18, height 5' 6"  (1.676 m), weight 95.255 kg (210 lb), SpO2 95 %.  I/O:   Intake/Output Summary (Last 24 hours) at 03/11/16 [phone]  Last data filed at 03/11/16 2585  Gross per 24 hour  Intake   2878 ml  Output    975 ml  Net   1903 ml    PHYSICAL EXAMINATION:  GENERAL:  70 y.o.-year-old patient lying in the bed with no acute distress.  EYES: Pupils equal, round, reactive to light and accommodation. No scleral icterus. Extraocular muscles intact.  HEENT: Head  atraumatic, normocephalic. Oropharynx and nasopharynx clear.  NECK:  Supple, no jugular venous distention. No thyroid enlargement, no tenderness.  LUNGS: Normal breath sounds bilaterally, no wheezing, rales,rhonchi or crepitation. No use of accessory muscles of respiration.  CARDIOVASCULAR: S1, S2 normal. No murmurs, rubs, or gallops.  ABDOMEN: Soft, non-tender, non-distended. Bowel sounds present. No organomegaly or mass.  EXTREMITIES: No pedal edema, cyanosis, or clubbing.  NEUROLOGIC: Cranial nerves II through XII are intact. Muscle strength 5/5 in all extremities. Sensation intact. Gait not checked.  PSYCHIATRIC: The patient is alert and oriented x 3.  SKIN: No obvious rash, lesion, or ulcer.   DATA REVIEW:   CBC   Recent Labs Lab 03/11/16 0620  WBC 6.9  HGB 12.4*  HCT 36.4*  PLT 214    Chemistries   Recent Labs Lab 03/09/16 1920 03/11/16 0620  NA 139 138  K 4.4 4.1  CL 105 111  CO2 24 25  GLUCOSE 148* 113*  BUN 44* 37*  CREATININE 1.46* 1.23  CALCIUM 9.0 7.8*  AST 48*  --   ALT 54  --   ALKPHOS 70  --   BILITOT 1.4*  --     Microbiology Results   Recent Results (from the past 240 hour(s))  Rapid Influenza A&B Antigens ([facility] only)     Status: None   Collection Time: 03/10/16 12:17 AM  Result Value Ref Range Status   Influenza A ([facility]) NEGATIVE NEGATIVE Final   Influenza B [facility]) NEGATIVE NEGATIVE Final    RADIOLOGY:  Ct Abdomen Pelvis W Contrast  03/10/2016  CLINICAL DATA:  Acute onset of generalized abdominal pain, vomiting and belching. Leukocytosis. Initial encounter. EXAM: CT ABDOMEN AND PELVIS WITH CONTRAST TECHNIQUE: Multidetector CT imaging of the abdomen and pelvis was performed using the standard protocol following bolus administration of intravenous contrast. CONTRAST:  74m OMNIPAQUE IOHEXOL 300 MG/ML  SOLN COMPARISON:  None. FINDINGS: The visualized lung bases are clear. The liver and spleen are unremarkable in appearance. The gallbladder is  within normal limits. The pancreas and adrenal glands are unremarkable. A 1.3 cm cyst is noted at the interpole region of the left kidney, with a few small left renal peripelvic cysts. A few small right renal cysts are also seen. There is no evidence of hydronephrosis. No renal or ureteral stones are seen. No perinephric stranding is appreciated. No free fluid is identified. The small bowel is unremarkable in appearance. The stomach is within normal limits. No acute vascular abnormalities are seen. The appendix is not definitely characterized; there is no evidence of appendicitis. The rectum and colon are partially filled with air and stool. There is suggestion of wall thickening along the descending and proximal sigmoid colon, which may reflect an infectious or inflammatory colitis. Scattered diverticulosis is noted along the descending and sigmoid colon. Vague nodular density is noted adjacent to the proximal to mid sigmoid colon, measuring approximately 3.2 cm, with associated soft tissue inflammation and question of mild more focal wall thickening. An underlying mass cannot be excluded. The bladder is mildly distended and contains a small amount of air, possibly reflecting prior  Foley catheter placement. A few stones are seen dependently within the bladder. The prostate is mildly enlarged, measuring 5.2 cm in transverse dimension. No inguinal lymphadenopathy is seen. No acute osseous abnormalities are identified. IMPRESSION: 1. Apparent mild more focal wall thickening along the proximal to mid sigmoid colon, with vague adjacent nodular density in the overlying fat, measuring approximately 3.2 cm. An underlying mass cannot be excluded. Colonoscopy would be helpful for further evaluation. 2. Suggestion of wall thickening along the descending and proximal sigmoid colon, which may reflect an infectious or inflammatory colitis. Scattered diverticulosis along the descending and sigmoid colon. 3. Small bilateral renal  cysts noted. 4. Mildly enlarged prostate. 5. Small amount of air within the bladder may reflect prior Foley catheter placement. Several small stones noted within the bladder. Electronically Signed   By: Garald Balding M.D.   On: 03/10/2016 02:19     Management plans discussed with the patient, family and they are in agreement.  CODE STATUS:     Code Status Orders        Start     Ordered   03/10/16 0644  Full code   Continuous     03/10/16 0643    Code Status History    Date Active Date Inactive Code Status Order ID Comments User Context   This patient has a current code status but no historical code status.      TOTAL TIME TAKING CARE OF THIS PATIENT: *40 minutes.    Shahara Hartsfield M.D on 03/11/2016 at 9:34 AM  Between 7am to 6pm - Pager - [phone] After 6pm go to [email or web address] - password [email or web address]  [facility]  Office  [phone]  CC: Primary care physician; No primary care provider on file.

## 2016-03-14 LAB — URINE CULTURE
Culture: >=100000
Special Requests: NORMAL

## 2018-02-25 ENCOUNTER — Encounter: Payer: Self-pay | Admitting: Gastroenterology

## 2018-02-25 ENCOUNTER — Other Ambulatory Visit
Admission: RE | Admit: 2018-02-25 | Discharge: 2018-02-25 | Disposition: A | Discharge status: Home / Self Care | Payer: Medicare Other | Source: Ambulatory Visit | Attending: Gastroenterology | Admitting: Gastroenterology

## 2018-02-25 ENCOUNTER — Encounter (INDEPENDENT_AMBULATORY_CARE_PROVIDER_SITE_OTHER): Payer: Self-pay

## 2018-02-25 ENCOUNTER — Other Ambulatory Visit: Payer: Self-pay

## 2018-02-25 ENCOUNTER — Ambulatory Visit: Payer: Medicare Other | Admitting: Gastroenterology

## 2018-02-25 VITALS — BP 143/76 | HR 94 | Temp 97.8°F | Ht 66.0 in | Wt 165.4 lb

## 2018-02-25 DIAGNOSIS — R195 Other fecal abnormalities: Secondary | ICD-10-CM | POA: Insufficient documentation

## 2018-02-25 DIAGNOSIS — K529 Noninfective gastroenteritis and colitis, unspecified: Secondary | ICD-10-CM | POA: Diagnosis present

## 2018-02-25 LAB — COMPREHENSIVE METABOLIC PANEL
ALK PHOS: 63 U/L (ref 38–126)
ALT: 15 U/L — AB (ref 17–63)
ANION GAP: 10 (ref 5–15)
AST: 23 U/L (ref 15–41)
Albumin: 3.8 g/dL (ref 3.5–5.0)
BILIRUBIN TOTAL: 1.2 mg/dL (ref 0.3–1.2)
BUN: 18 mg/dL (ref 6–20)
CO2: 27 mmol/L (ref 22–32)
CREATININE: 1.09 mg/dL (ref 0.61–1.24)
Calcium: 8.9 mg/dL (ref 8.9–10.3)
Chloride: 107 mmol/L (ref 101–111)
GFR calc non Af Amer: >60 mL/min (ref >60)
Glucose, Bld: 94 mg/dL (ref 65–99)
Potassium: 3.6 mmol/L (ref 3.5–5.1)
Sodium: 144 mmol/L (ref 135–145)
TOTAL PROTEIN: 7.6 g/dL (ref 6.5–8.1)

## 2018-02-25 LAB — CBC
HCT: 33.1 % — ABNORMAL LOW (ref 40.0–52.0)
Hemoglobin: 10.7 g/dL — ABNORMAL LOW (ref 13.0–18.0)
MCH: 26 pg (ref 26.0–34.0)
MCHC: 32.4 g/dL (ref 32.0–36.0)
MCV: 80.2 fL (ref 80.0–100.0)
PLATELETS: 241 10*3/uL (ref 150–440)
RBC: 4.12 MIL/uL — AB (ref 4.40–5.90)
RDW: 15.7 % — ABNORMAL HIGH (ref 11.5–14.5)
WBC: 5.8 10*3/uL (ref 3.8–10.6)

## 2018-02-25 NOTE — Progress Notes (Signed)
William Darby, MD [address]  [place]  [place],  [address]  Main: [phone]  Fax: [phone]    Gastroenterology Consultation  Referring Provider:     [facility] Primary Care Physician:  Novella Rob, FNP Primary Gastroenterologist:  Dr. Cephas Holland Reason for Consultation:     Hemoccult-positive stool, weight loss        HPI:   William Holland is a 72 y.o. male referred by Dr. Novella Rob, FNP  for consultation & management of heme-positive stool. Patient is accompanied by his brother who is his power of attorney. Patient has been experiencing 3-4 month history of nonbloody diarrhea, unintentional weight loss. He has been having several episodes of loose bowel movements during the day as well as, nocturnal diarrhea. Patient was [facility] 2017 secondary to abdominal pain, vomiting and belching, underwent CT which revealed wall thickening along the descending and proximal sigmoid colon, scattered diverticulosis in the descending and sigmoid colon. There was also a focal lesion measuring about 3.2 cm adjacent to proximal to mid sigmoid colon with soft tissue inflammation and focal wall thickening, underlying mass could not be excluded. Patient was empirically treated for diverticulitis with antibiotics and was discharged home.   According to his brother, patient lost about 45 pounds. He weighed 210 pounds in 02/2016, and today he weighs 165 pounds. Patient denies loss of appetite, fever, chills, nausea, vomiting, rectal bleeding, melena, abdominal pain.  NSAIDs: None  Antiplts/Anticoagulants/Anti thrombotics: None  GI Procedures: Never had a colonoscopy before No EGD in the past Mother with colon cancer at age 70 Patient is single, do not have children  History reviewed. No pertinent past medical history.  Past Surgical History:  Procedure Laterality Date  . APPENDECTOMY    . EYE SURGERY      Current  Outpatient Medications:  .  acetaminophen (TYLENOL) 325 MG tablet, Take 650 mg by mouth every 6 (six) hours as needed., Disp: , Rfl:  .  tamsulosin (FLOMAX) 0.4 MG CAPS capsule, , Disp: , Rfl:    Family History  Problem Relation Age of Onset  . Cancer Brother   . Cancer Father      Social History   Tobacco Use  . Smoking status: Never Smoker  . Smokeless tobacco: Never Used  Substance Use Topics  . Alcohol use: No    Alcohol/week: 0.0 oz  . Drug use: No    Allergies as of 02/25/2018  . (No Known Allergies)    Review of Systems:    All systems reviewed and negative except where noted in HPI.   Physical Exam:  BP (!) 143/76   Pulse 94   Temp 97.8 F (36.6 C) (Oral)   Ht 5' 6"  (1.676 m)   Wt 165 lb 6.4 oz (75 kg)   BMI 26.70 kg/m  No LMP for male patient.  General:   Alert,  Well-developed, well-nourished, pleasant and cooperative in NAD Head:  Normocephalic and atraumatic. Eyes:  Sclera clear, no icterus.   Conjunctiva pink. Ears:  Normal auditory acuity. Nose:  No deformity, discharge, or lesions. Mouth:  No deformity or lesions,oropharynx pink & moist. Neck:  Supple; no masses or thyromegaly. Lungs:  Respirations even and unlabored.  Clear throughout to auscultation.   No wheezes, crackles, or rhonchi. No acute distress. Heart:  Regular rate and rhythm; no murmurs, clicks, rubs, or gallops. Abdomen:  Normal bowel sounds. Soft, non-tender and non-distended without masses,  hepatosplenomegaly or hernias noted.  No guarding or rebound tenderness.   Rectal: Not performed Msk:  Symmetrical without gross deformities. Good, equal movement & strength bilaterally. Pulses:  Normal pulses noted. Extremities:  No clubbing or edema.  No cyanosis. Neurologic:  Alert and oriented x3;  grossly normal neurologically. Skin:  Intact without significant lesions or rashes. No jaundice. Lymph Nodes:  No significant cervical adenopathy. Psych:  Alert and cooperative. Normal mood and  affect.  Imaging Studies: Reviewed  Assessment and Plan:   William Holland is a 71 y.o. male with no significant past medical history, seen in consultation for fecal occult blood positive, about 3 months history of nonbloody diarrhea, unintentional weight loss  - Check CBC, stool for C. difficile and other GI pathogens - Check HIV, acute hepatitis panel, CMP - Colonoscopy if infectious workup negative  I have discussed alternative options, risks & benefits,  which include, but are not limited to, bleeding, infection, perforation,respiratory complication & drug reaction.  The patient agrees with this plan & written consent will be obtained.    Follow up in 4 weeks or sooner based on the colonoscopy findings   William Darby, MD

## 2018-02-26 ENCOUNTER — Other Ambulatory Visit
Admission: RE | Admit: 2018-02-26 | Discharge: 2018-02-26 | Disposition: A | Discharge status: Home / Self Care | Payer: Medicare Other | Source: Ambulatory Visit | Attending: Gastroenterology | Admitting: Gastroenterology

## 2018-02-26 ENCOUNTER — Other Ambulatory Visit: Payer: Self-pay

## 2018-02-26 DIAGNOSIS — K529 Noninfective gastroenteritis and colitis, unspecified: Secondary | ICD-10-CM | POA: Diagnosis present

## 2018-02-26 DIAGNOSIS — R195 Other fecal abnormalities: Secondary | ICD-10-CM | POA: Insufficient documentation

## 2018-02-26 DIAGNOSIS — R634 Abnormal weight loss: Secondary | ICD-10-CM

## 2018-02-26 DIAGNOSIS — K921 Melena: Secondary | ICD-10-CM

## 2018-02-26 LAB — GASTROINTESTINAL PANEL BY PCR, STOOL (REPLACES STOOL CULTURE)
Adenovirus F40/41: NOT DETECTED
Astrovirus: NOT DETECTED
CAMPYLOBACTER SPECIES: NOT DETECTED
CRYPTOSPORIDIUM: NOT DETECTED
CYCLOSPORA CAYETANENSIS: NOT DETECTED
Entamoeba histolytica: NOT DETECTED
Enteroaggregative E coli (EAEC): NOT DETECTED
Enteropathogenic E coli (EPEC): NOT DETECTED
Enterotoxigenic E coli (ETEC): NOT DETECTED
Giardia lamblia: NOT DETECTED
Norovirus GI/GII: NOT DETECTED
PLESIMONAS SHIGELLOIDES: NOT DETECTED
ROTAVIRUS A: NOT DETECTED
SALMONELLA SPECIES: NOT DETECTED
SAPOVIRUS (I, II, IV, AND V): NOT DETECTED
SHIGELLA/ENTEROINVASIVE E COLI (EIEC): NOT DETECTED
Shiga like toxin producing E coli (STEC): NOT DETECTED
Vibrio cholerae: NOT DETECTED
Vibrio species: NOT DETECTED
YERSINIA ENTEROCOLITICA: NOT DETECTED

## 2018-02-26 LAB — HEPATITIS PANEL, ACUTE
HEP B S AG: NEGATIVE
Hep A IgM: NEGATIVE
Hep B C IgM: NEGATIVE

## 2018-02-26 LAB — C DIFFICILE QUICK SCREEN W PCR REFLEX
C DIFFICILE (CDIFF) INTERP: NOT DETECTED
C Diff antigen: NEGATIVE
C Diff toxin: NEGATIVE

## 2018-02-26 LAB — HIV ANTIBODY (ROUTINE TESTING W REFLEX): HIV Screen 4th Generation wRfx: NONREACTIVE

## 2018-02-27 ENCOUNTER — Encounter
Admission: RE | Disposition: A | Discharge status: Home / Self Care | Payer: Self-pay | Source: Ambulatory Visit | Attending: Gastroenterology

## 2018-02-27 ENCOUNTER — Ambulatory Visit: Payer: Medicare Other | Admitting: Anesthesiology

## 2018-02-27 ENCOUNTER — Ambulatory Visit
Admission: RE | Admit: 2018-02-27 | Discharge: 2018-02-27 | Disposition: A | Discharge status: Home / Self Care | Payer: Medicare Other | Source: Ambulatory Visit | Attending: Gastroenterology | Admitting: Gastroenterology

## 2018-02-27 DIAGNOSIS — K621 Rectal polyp: Secondary | ICD-10-CM | POA: Insufficient documentation

## 2018-02-27 DIAGNOSIS — Z6825 Body mass index (BMI) 25.0-25.9, adult: Secondary | ICD-10-CM | POA: Insufficient documentation

## 2018-02-27 DIAGNOSIS — K5730 Diverticulosis of large intestine without perforation or abscess without bleeding: Secondary | ICD-10-CM

## 2018-02-27 DIAGNOSIS — K529 Noninfective gastroenteritis and colitis, unspecified: Secondary | ICD-10-CM | POA: Diagnosis not present

## 2018-02-27 DIAGNOSIS — R634 Abnormal weight loss: Secondary | ICD-10-CM | POA: Diagnosis not present

## 2018-02-27 DIAGNOSIS — Z809 Family history of malignant neoplasm, unspecified: Secondary | ICD-10-CM | POA: Insufficient documentation

## 2018-02-27 DIAGNOSIS — R197 Diarrhea, unspecified: Secondary | ICD-10-CM | POA: Diagnosis present

## 2018-02-27 HISTORY — DX: Diverticulosis of large intestine without perforation or abscess without bleeding: K57.30

## 2018-02-27 HISTORY — PX: COLONOSCOPY WITH PROPOFOL: SHX5780

## 2018-02-27 SURGERY — COLONOSCOPY WITH PROPOFOL
Anesthesia: General

## 2018-02-27 MED ORDER — PROPOFOL 500 MG/50ML IV EMUL
INTRAVENOUS | Status: AC
  Filled 2018-02-27: qty 100

## 2018-02-27 MED ORDER — EPHEDRINE SULFATE 50 MG/ML IJ SOLN
INTRAMUSCULAR | Status: DC | PRN
  Administered 2018-02-27 (×2): 10 mg via INTRAVENOUS

## 2018-02-27 MED ORDER — GLYCOPYRROLATE 0.2 MG/ML IJ SOLN
INTRAMUSCULAR | Status: AC
  Filled 2018-02-27: qty 1

## 2018-02-27 MED ORDER — METRONIDAZOLE 500 MG PO TABS: 500.0000 mg | ORAL_TABLET | Freq: Two times a day (BID) | ORAL | 0 refills | Status: DC

## 2018-02-27 MED ORDER — SODIUM CHLORIDE 0.9 % IV SOLN
INTRAVENOUS | Status: DC
  Administered 2018-02-27: 1000 mL via INTRAVENOUS

## 2018-02-27 MED ORDER — CIPROFLOXACIN HCL 500 MG PO TABS: 500.0000 mg | ORAL_TABLET | Freq: Two times a day (BID) | ORAL | 0 refills | Status: DC

## 2018-02-27 MED ORDER — LIDOCAINE HCL (CARDIAC) 20 MG/ML IV SOLN
INTRAVENOUS | Status: DC | PRN
  Administered 2018-02-27: 40 mg via INTRAVENOUS

## 2018-02-27 MED ORDER — PHENYLEPHRINE HCL 10 MG/ML IJ SOLN
INTRAMUSCULAR | Status: DC | PRN
  Administered 2018-02-27: 100 ug via INTRAVENOUS

## 2018-02-27 MED ORDER — PROPOFOL 500 MG/50ML IV EMUL
INTRAVENOUS | Status: DC | PRN
  Administered 2018-02-27: 140 ug/kg/min via INTRAVENOUS

## 2018-02-27 MED ORDER — PROPOFOL 10 MG/ML IV BOLUS
INTRAVENOUS | Status: DC | PRN
  Administered 2018-02-27: 60 mg via INTRAVENOUS

## 2018-02-27 MED ORDER — LIDOCAINE HCL (PF) 2 % IJ SOLN
INTRAMUSCULAR | Status: AC
  Filled 2018-02-27: qty 10

## 2018-02-27 NOTE — Op Note (Signed)
[facility] Gastroenterology Patient Name: William Holland Procedure Date: 02/27/2018 10:27 AM MRN: [id number] Account #: [id number] Date of Birth: 23-Feb-1946 Admit Type: Outpatient Age: 72 Room: [facility] ENDO ROOM 2 Gender: Male Note Status: Finalized Procedure:            Colonoscopy Indications:          Chronic diarrhea, Clinically significant diarrhea of                        unexplained origin, Weight loss Providers:            Lin Landsman MD, MD Referring MD:         No Local Md, MD (Referring MD) Medicines:            Monitored Anesthesia Care Complications:        No immediate complications. Estimated blood loss:                        Minimal. Procedure:            Pre-Anesthesia Assessment:                       - Prior to the procedure, a History and Physical was                        performed, and patient medications and allergies were                        reviewed. The patient is competent. The risks and                        benefits of the procedure and the sedation options and                        risks were discussed with the patient. All questions                        were answered and informed consent was obtained.                        Patient identification and proposed procedure were                        verified by the physician, the nurse, the                        anesthesiologist, the anesthetist and the technician in                        the pre-procedure area in the procedure room in the                        endoscopy suite. Mental Status Examination: alert and                        oriented. Airway Examination: normal oropharyngeal                        airway and neck mobility. Respiratory Examination:  clear to auscultation. CV Examination: normal.                        Prophylactic Antibiotics: The patient does not require                        prophylactic antibiotics. Prior Anticoagulants:  The                        patient has taken no previous anticoagulant or                        antiplatelet agents. ASA Grade Assessment: II - A                        patient with mild systemic disease. After reviewing the                        risks and benefits, the patient was deemed in                        satisfactory condition to undergo the procedure. The                        anesthesia plan was to use monitored anesthesia care                        (MAC). Immediately prior to administration of                        medications, the patient was re-assessed for adequacy                        to receive sedatives. The heart rate, respiratory rate,                        oxygen saturations, blood pressure, adequacy of                        pulmonary ventilation, and response to care were                        monitored throughout the procedure. The physical status                        of the patient was re-assessed after the procedure.                       After obtaining informed consent, the colonoscope was                        passed under direct vision. Throughout the procedure,                        the patient's blood pressure, pulse, and oxygen                        saturations were monitored continuously. The                        Colonoscope  was introduced through the anus and                        advanced to the the terminal ileum. The colonoscopy was                        performed with moderate difficulty due to poor bowel                        prep with stool present and significant looping.                        Successful completion of the procedure was aided by                        applying abdominal pressure, lavage and performing the                        maneuvers documented (below) in this report. The                        patient tolerated the procedure well. The quality of                        the bowel preparation was evaluated  using the BBPS                        [facility] Bowel Preparation Scale) with scores of: Right                        Colon = 2 (minor amount of residual staining, small                        fragments of stool and/or opaque liquid, but mucosa                        seen well), Transverse Colon = 2 (minor amount of                        residual staining, small fragments of stool and/or                        opaque liquid, but mucosa seen well) and Left Colon = 2                        (minor amount of residual staining, small fragments of                        stool and/or opaque liquid, but mucosa seen well). The                        total BBPS score equals 6. The quality of the bowel                        preparation was fair. Findings:      The perianal and digital rectal examinations were normal. Pertinent       negatives include normal sphincter tone and no palpable rectal lesions.  The terminal ileum appeared normal. Biopsies were taken with a cold       forceps for histology.      A large amount of semi-liquid stool was found in the dilated colon       proximal to sigmoid in descending colon, in the transverse colon, in the       ascending colon and in the cecum, interfering with visualization. Lavage       of the area was performed using greater than 500 mL of sterile water,       resulting in clearance with fair visualization.      A 6 mm polypoid lesion was found appendiceal orifice. The lesion was       sessile. No bleeding was present. This was biopsied with a cold forceps       for histology.      A benign-appearing, intrinsic moderate stenosis was found in the sigmoid       colon secondary to severe sigmoid diverticulosis at 20cm with upstream       dilatation of the proximal colon and was traversed with mild resistance.       Biopsies were taken with a cold forceps for histology.      Normal mucosa was found in the rectum, in the descending colon, in the        transverse colon, in the ascending colon and in the cecum. Biopsies for       histology were taken with a cold forceps from the entire colon for       evaluation of microscopic colitis.      Multiple diverticula were found in the sigmoid colon, descending colon,       transverse colon and ascending colon. There was moderate narrowing of       the colon in association with severe sigmoid diverticulosis.       Peri-diverticular erythema was seen. Biopsies were taken with a cold       forceps for histology.      A 6 mm polyp was found in the rectum. The polyp was sessile. The polyp       was removed with a hot snare. Resection and retrieval were complete.      Unable to perform retroflexion in rectum Impression:           - Preparation of the colon was fair. No evidence of                        malignancy                       - The examined portion of the ileum was normal.                        Biopsied.                       - Stool in the descending colon, in the transverse                        colon, in the ascending colon and in the cecum.                       - Likely benign polypoid lesion at the appendiceal  orifice. Biopsied.                       - Stricture in the sigmoid colon. Biopsied.                       - Normal mucosa in the rectum, in the descending colon,                        in the transverse colon, in the ascending colon and in                        the cecum. Biopsied.                       - Severe diverticulosis in the sigmoid colon, in the                        descending colon, in the transverse colon and in the                        ascending colon. There was narrowing of the sigmoid                        colon in association with the diverticulosis.                        Peri-diverticular erythema was seen. Biopsied.                       - One 6 mm polyp in the rectum, removed with a hot                        snare. Resected  and retrieved. Recommendation:       - Await pathology results.                       - Discharge patient to home.                       - Resume previous diet today.                       - Continue present medications.                       - Repeat colonoscopy in 3 - 5 years for surveillance                        based on pathology results.                       - Recommend 2 weeks course of cipro and flagyl                       - Return to my office as previously scheduled. Procedure Code(s):    --- Professional ---                       [phone], Colonoscopy, flexible; with removal of tumor(s),  polyp(s), or other lesion(s) by snare technique                       [id number], 59, Colonoscopy, flexible; with biopsy, single                        or multiple Diagnosis Code(s):    --- Professional ---                       D49.0, Neoplasm of unspecified behavior of digestive                        system                       K56.69, Other intestinal obstruction                       K62.1, Rectal polyp                       K52.9, Noninfective gastroenteritis and colitis,                        unspecified                       R19.7, Diarrhea, unspecified                       R63.4, Abnormal weight loss                       K57.30, Diverticulosis of large intestine without                        perforation or abscess without bleeding CPT copyright 2016 American Medical Association. All rights reserved. The codes documented in this report are preliminary and upon coder review may  be revised to meet current compliance requirements. Dr. Ulyess Mort Lin Landsman MD, MD 02/27/2018 11:35:33 AM This report has been signed electronically. Number of Addenda: 0 Note Initiated On: 02/27/2018 10:27 AM Scope Withdrawal Time: 0 hours 30 minutes 15 seconds  Total Procedure Duration: 0 hours 48 minutes 15 seconds       [facility]

## 2018-02-27 NOTE — Anesthesia Postprocedure Evaluation (Signed)
Anesthesia Post Note  Patient: William Holland  Procedure(s) Performed: COLONOSCOPY WITH PROPOFOL (N/A )  Patient location during evaluation: Endoscopy Anesthesia Type: General Level of consciousness: awake and alert and oriented Pain management: pain level controlled Vital Signs Assessment: post-procedure vital signs reviewed and stable Respiratory status: spontaneous breathing, nonlabored ventilation and respiratory function stable Cardiovascular status: blood pressure returned to baseline and stable Postop Assessment: no signs of nausea or vomiting Anesthetic complications: no     Last Vitals:  Vitals:   02/27/18 1127 02/27/18 1135  BP: (!) 105/56   Pulse: 83 78  Resp: 12 18  Temp: (!) 36.1 C   SpO2: 100% 100%    Last Pain:  Vitals:   02/27/18 1125  TempSrc: Tympanic                 [name]

## 2018-02-27 NOTE — Anesthesia Preprocedure Evaluation (Signed)
Anesthesia Evaluation  Patient identified by MRN, date of birth, ID band Patient awake    Reviewed: Allergy & Precautions, NPO status , Patient's Chart, lab work & pertinent test results  History of Anesthesia Complications Negative for: history of anesthetic complications  Airway Mallampati: III  TM Distance: >3 FB Neck ROM: Full    Dental  (+) Edentulous Upper, Edentulous Lower   Pulmonary neg pulmonary ROS, neg sleep apnea, neg COPD,    breath sounds clear to auscultation- rhonchi (-) wheezing      Cardiovascular (-) hypertension(-) CAD, (-) Past MI, (-) Cardiac Stents and (-) CABG  Rhythm:Regular Rate:Normal - Systolic murmurs and - Diastolic murmurs    Neuro/Psych negative neurological ROS  negative psych ROS   GI/Hepatic Neg liver ROS, Weight loss    Endo/Other  negative endocrine ROSneg diabetes  Renal/GU negative Renal ROS     Musculoskeletal negative musculoskeletal ROS (+)   Abdominal (+) - obese,   Peds  Hematology negative hematology ROS (+)   Anesthesia Other Findings BPH  Reproductive/Obstetrics                             Anesthesia Physical Anesthesia Plan  ASA: II  Anesthesia Plan: General   Post-op Pain Management:    Induction: Intravenous  PONV Risk Score and Plan: 1 and Propofol infusion  Airway Management Planned: Natural Airway  Additional Equipment:   Intra-op Plan:   Post-operative Plan:   Informed Consent: I have reviewed the patients History and Physical, chart, labs and discussed the procedure including the risks, benefits and alternatives for the proposed anesthesia with the patient or authorized representative who has indicated his/her understanding and acceptance.   Dental advisory given  Plan Discussed with: CRNA and Anesthesiologist  Anesthesia Plan Comments:         Anesthesia Quick Evaluation

## 2018-02-27 NOTE — Anesthesia Post-op Follow-up Note (Signed)
Anesthesia QCDR form completed.        

## 2018-02-27 NOTE — H&P (Signed)
  Cephas Darby, MD [address]  [place]  [place], [place] [address]  Main: [phone]  Fax: [phone] Pager: [phone]  Primary Care Physician:  Novella Rob, FNP Primary Gastroenterologist:  Dr. Cephas Darby  Pre-Procedure History & Physical: HPI:  William Holland is a 72 y.o. male is here for an colonoscopy.   No past medical history on file.  Past Surgical History:  Procedure Laterality Date  . APPENDECTOMY    . EYE SURGERY      Prior to Admission medications   Medication Sig Start Date End Date Taking? Authorizing Provider  acetaminophen (TYLENOL) 325 MG tablet Take 650 mg by mouth every 6 (six) hours as needed.   Yes [provider]  tamsulosin (FLOMAX) 0.4 MG CAPS capsule  01/30/18  Yes [provider]    Allergies as of 02/26/2018  . (No Known Allergies)    Family History  Problem Relation Age of Onset  . Cancer Brother   . Cancer Father     Social History   Socioeconomic History  . Marital status: Single    Spouse name: Not on file  . Number of children: Not on file  . Years of education: Not on file  . Highest education level: Not on file  Social Needs  . Financial resource strain: Not on file  . Food insecurity - worry: Not on file  . Food insecurity - inability: Not on file  . Transportation needs - medical: Not on file  . Transportation needs - non-medical: Not on file  Occupational History  . Occupation: retired  Tobacco Use  . Smoking status: Never Smoker  . Smokeless tobacco: Never Used  Substance and Sexual Activity  . Alcohol use: No    Alcohol/week: 0.0 oz  . Drug use: No  . Sexual activity: Not on file    Comment: Not Asked  Other Topics Concern  . Not on file  Social History Narrative  . Not on file    Review of Systems: See HPI, otherwise negative ROS  Physical Exam: BP 133/65   Pulse 88   Temp (!) 97 F (36.1 C) (Tympanic)   Resp 16   Ht 5' 6"  (1.676 m)   Wt 155 lb (70.3 kg)   SpO2  100%   BMI 25.02 kg/m  General:   Alert,  pleasant and cooperative in NAD Head:  Normocephalic and atraumatic. Neck:  Supple; no masses or thyromegaly. Lungs:  Clear throughout to auscultation.    Heart:  Regular rate and rhythm. Abdomen:  Soft, nontender and nondistended. Normal bowel sounds, without guarding, and without rebound.   Neurologic:  Alert and  oriented x4;  grossly normal neurologically.  Impression/Plan: William Holland is here for an colonoscopy to be performed for diarrhea and weight loss  Risks, benefits, limitations, and alternatives regarding  colonoscopy have been reviewed with the patient.  Questions have been answered.  All parties agreeable.   Sherri Sear, MD  02/27/2018, 10:15 AM

## 2018-02-27 NOTE — Transfer of Care (Signed)
Immediate Anesthesia Transfer of Care Note  Patient: William Holland  Procedure(s) Performed: COLONOSCOPY WITH PROPOFOL (N/A )  Patient Location: PACU  Anesthesia Type:General  Level of Consciousness: sedated  Airway & Oxygen Therapy: Patient Spontanous Breathing and Patient connected to nasal cannula oxygen  Post-op Assessment: Report given to RN and Post -op Vital signs reviewed and stable  Post vital signs: Reviewed and stable  Last Vitals:  Vitals:   02/27/18 1125 02/27/18 1127  BP: (!) 105/56 (!) 105/56  Pulse: 83 83  Resp: 17 12  Temp: (!) 36.1 C (!) 36.1 C  SpO2: 100% 100%    Last Pain:  Vitals:   02/27/18 1125  TempSrc: Tympanic         Complications: No apparent anesthesia complications

## 2018-02-28 ENCOUNTER — Encounter: Payer: Self-pay | Admitting: Gastroenterology

## 2018-02-28 LAB — SURGICAL PATHOLOGY

## 2018-03-03 ENCOUNTER — Telehealth: Payer: Self-pay

## 2018-03-03 NOTE — Telephone Encounter (Signed)
Patients brother has been informed colonoscopy did not reveal any signs of cancer, however it shows his brother has diverticulosis and to make sure his brother remains on his antibiotics for 2 weeks as prescribed.  Reminded of follow up appt.

## 2018-03-03 NOTE — Telephone Encounter (Signed)
-----   Message from Lin Landsman, MD sent at 03/03/2018 11:03 AM EDT ----- Please inform patient that his biopsy results from most recent colonoscopy did not show any cancer. He does have inflammation secondary to diverticulosis in his colon and he should take antibiotics for 2 weeks as prescribed. I will see him on 03/14/18  -RV

## 2018-03-13 ENCOUNTER — Other Ambulatory Visit: Payer: Self-pay

## 2018-03-14 ENCOUNTER — Ambulatory Visit: Payer: Medicare Other | Admitting: Gastroenterology

## 2018-03-14 ENCOUNTER — Other Ambulatory Visit: Payer: Self-pay

## 2018-03-14 ENCOUNTER — Encounter: Payer: Self-pay | Admitting: Gastroenterology

## 2018-03-14 VITALS — BP 142/74 | HR 105 | Temp 97.8°F | Ht 66.0 in | Wt 170.6 lb

## 2018-03-14 DIAGNOSIS — K50112 Crohn's disease of large intestine with intestinal obstruction: Secondary | ICD-10-CM

## 2018-03-14 DIAGNOSIS — K5730 Diverticulosis of large intestine without perforation or abscess without bleeding: Secondary | ICD-10-CM | POA: Diagnosis not present

## 2018-03-14 MED ORDER — METRONIDAZOLE 500 MG PO TABS: 500.0000 mg | ORAL_TABLET | Freq: Two times a day (BID) | ORAL | 0 refills | Status: AC

## 2018-03-14 MED ORDER — CIPROFLOXACIN HCL 500 MG PO TABS: 500.0000 mg | ORAL_TABLET | Freq: Two times a day (BID) | ORAL | 0 refills | Status: AC

## 2018-03-14 NOTE — Progress Notes (Signed)
William Darby, MD [address]  [place]  [place], [place] [address]  Main: [phone]  Fax: [phone]    Gastroenterology Consultation  Referring Provider:     Novella Rob, FNP Primary Care Physician:  Novella Rob, FNP Primary Gastroenterologist:  Dr. Cephas Holland Reason for Consultation:     Hemoccult-positive stool, weight loss        HPI:   Farzad Tibbetts is a 72 y.o. male referred by Dr. Novella Rob, FNP  for consultation & management of heme-positive stool. Patient is accompanied by his brother who is his power of attorney. Patient has been experiencing 3-4 month history of nonbloody diarrhea, unintentional weight loss. He has been having several episodes of loose bowel movements during the day as well as, nocturnal diarrhea. Patient was admitted to [facility] in 2017 secondary to abdominal pain, vomiting and belching, underwent CT which revealed wall thickening along the descending and proximal sigmoid colon, scattered diverticulosis in the descending and sigmoid colon. There was also a focal lesion measuring about 3.2 cm adjacent to proximal to mid sigmoid colon with soft tissue inflammation and focal wall thickening, underlying mass could not be excluded. Patient was empirically treated for diverticulitis with antibiotics and was discharged home.   According to his brother, patient lost about 45 pounds. He weighed 210 pounds in 02/2016, and today he weighs 165 pounds. Patient denies loss of appetite, fever, chills, nausea, vomiting, rectal bleeding, melena, abdominal pain.  Follow-up visit 03/14/2018 Patient underwent stool studies for chronic diarrhea, there is no evidence of infection. He had normocytic anemia. He underwent colonoscopy which revealed severe sigmoid diverticulosis that resulted in moderate narrowing of the sigmoid colon with upstream dilation of the proximal colon This area was traversed with mild resistance. The  biopsies confirmed segmental colitis associated with diverticulosis. There was no evidence of colorectal malignancy. I treated this with 2 weeks course of Cipro and Flagyl. Patient is here for follow-up. He no longer experiences diarrhea, bloating, abdominal pain, nausea, vomiting. He gained 5 pounds since last visit. He reports good appetite. He is accompanied by his brother today who is his caregiver.  NSAIDs: None  Antiplts/Anticoagulants/Anti thrombotics: None  GI Procedures:  Colonoscopy 02/27/2018 - Preparation of the colon was fair. No evidence of malignancy - The examined portion of the ileum was normal. Biopsied. - Stool in the descending colon, in the transverse colon, in the ascending colon and in the cecum. - Likely benign polypoid lesion at the appendiceal orifice. Biopsied. - Stricture in the sigmoid colon. Biopsied. - Normal mucosa in the rectum, in the descending colon, in the transverse colon, in the ascending colon and in the cecum. Biopsied. - Severe diverticulosis in the sigmoid colon, in the descending colon, in the transverse colon and in the ascending colon. There was moderate narrowing of the sigmoid colon in association with the diverticulosis. Peri-diverticular erythema was seen. Biopsied. - One 6 mm polyp in the rectum, removed with a hot snare. Resected and retrieved. DIAGNOSIS:  A. TERMINAL ILEUM; COLD BIOPSY:  - UNREMARKABLE SMALL INTESTINAL MUCOSA.   B. COLON POLYPOID LESION, APPENDICEAL ORIFICE; COLD BIOPSY:  - POLYPOID FRAGMENT OF MILDLY INFLAMED COLONIC MUCOSA WITH PROMINENT  LYMPHOID AGGREGATES.  - NEGATIVE FOR CHRONICITY, DYSPLASIA, AND MALIGNANCY.   C. COLON, RANDOM; COLD BIOPSY:  - COLONIC MUCOSA WITH REACTIVE LYMPHOID AGGREGATES.  - NEGATIVE FOR MICROSCOPIC COLITIS, DYSPLASIA, AND MALIGNANCY.   D. COLON, SIGMOID THICKENING; COLD BIOPSY:  - COLONIC MUCOSA  WITH FOCAL PROMINENT LYMPHOID AGGREGATE.  - NEGATIVE FOR MICROSCOPIC COLITIS, DYSPLASIA,  AND MALIGNANCY.   E. COLON POLYPOID LESION, SIGMOID 20 CM; COLD BIOPSY:  - POLYPOID FRAGMENT OF COLONIC MUCOSA WITH FOCAL MODERATE ACTIVE COLITIS  AND PROMINENT LYMPHOID AGGREGATES, SEE COMMENT.  - NEGATIVE FOR VIRAL CYTOPATHIC EFFECT, DYSPLASIA, AND MALIGNANCY.   F. RECTUM POLYP; HOT SNARE:  - PROMINENT THERMAL ARTIFACT, FAVOR HYPERPLASTIC POLYP.  - DUE TO THERMAL ARTIFACT DEFINITE DYSPLASIA CANNOT BE EXCLUDED.   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   No EGD in the past Mother with colon cancer at age 43 Patient is single, do not have children  History reviewed. No pertinent past medical history.  Past Surgical History:  Procedure Laterality Date  . APPENDECTOMY    . COLONOSCOPY WITH PROPOFOL N/A 02/27/2018   Procedure: COLONOSCOPY WITH PROPOFOL;  Surgeon: Lin Landsman, MD;  Location: [facility] ENDOSCOPY;  Service: Gastroenterology;  Laterality: N/A;  . EYE SURGERY      Current Outpatient Medications:  .  tamsulosin (FLOMAX) 0.4 MG CAPS capsule, , Disp: , Rfl:    Family History  Problem Relation Age of Onset  . Cancer Brother   . Cancer Father      Social History   Tobacco Use  . Smoking status: Never Smoker  . Smokeless tobacco: Never Used  Substance Use Topics  . Alcohol use: No    Alcohol/week: 0.0 oz  . Drug use: No    Allergies as of 03/14/2018  . (No Known Allergies)    Review of Systems:    All systems reviewed and negative except where noted in HPI.   Physical Exam:  BP (!) 142/74   Pulse (!) 105   Temp 97.8 F (36.6 C) (Oral)   Ht 5' 6"  (1.676 m)   Wt 170 lb 9.6 oz (77.4 kg)   BMI 27.54 kg/m  No LMP for male patient.  General:   Alert,  Well-developed, well-nourished, pleasant and cooperative in NAD Head:  Normocephalic and atraumatic. Eyes:  Sclera clear, no icterus.   Conjunctiva pink. Ears:  Normal auditory acuity. Nose:  No  deformity, discharge, or lesions. Mouth:  No deformity or lesions,oropharynx pink & moist. Neck:  Supple; no masses or thyromegaly. Lungs:  Respirations even and unlabored.  Clear throughout to auscultation.   No wheezes, crackles, or rhonchi. No acute distress. Heart:  Regular rate and rhythm; no murmurs, clicks, rubs, or gallops. Abdomen:  Normal bowel sounds. Soft, non-tender and non-distended without masses, hepatosplenomegaly or hernias noted.  No guarding or rebound tenderness.   Rectal: Not performed Msk:  Symmetrical without gross deformities. Good, equal movement & strength bilaterally. Pulses:  Normal pulses noted. Extremities:  No clubbing or edema.  No cyanosis. Neurologic:  Alert and oriented x3;  grossly normal neurologically. Skin:  Intact without significant lesions or rashes. No jaundice. Lymph Nodes:  No significant cervical adenopathy. Psych:  Alert and cooperative. Normal mood and affect.  Imaging Studies: Reviewed  Assessment and Plan:   Florence Antonelli is a 72 y.o. male with no significant past medical history, seen in consultation for fecal occult blood positive, about 3 months history of nonbloody diarrhea, unintentional weight loss. He is here for follow-up. Colonoscopy revealed benign narrowing of the sigmoid colon secondary to severe diverticulosis. 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 His symptoms have currently resolved with 2 weeks course of Cipro and  Flagyl.  - Recommended him to take another course of Cipro and Flagyl if his symptoms recur. Refill provided   Follow up in 3 months   William Darby, MD

## 2018-07-15 ENCOUNTER — Ambulatory Visit: Payer: Medicare Other | Admitting: Gastroenterology

## 2018-07-18 ENCOUNTER — Other Ambulatory Visit
Admission: RE | Admit: 2018-07-18 | Discharge: 2018-07-18 | Disposition: A | Discharge status: Home / Self Care | Payer: Medicare Other | Source: Ambulatory Visit | Attending: Gastroenterology | Admitting: Gastroenterology

## 2018-07-18 ENCOUNTER — Encounter: Payer: Self-pay | Admitting: Gastroenterology

## 2018-07-18 ENCOUNTER — Ambulatory Visit
Admission: RE | Admit: 2018-07-18 | Discharge: 2018-07-18 | Disposition: A | Discharge status: Home / Self Care | Payer: Medicare Other | Source: Ambulatory Visit | Attending: Gastroenterology | Admitting: Gastroenterology

## 2018-07-18 ENCOUNTER — Other Ambulatory Visit: Payer: Self-pay

## 2018-07-18 ENCOUNTER — Ambulatory Visit: Payer: Medicare Other | Admitting: Gastroenterology

## 2018-07-18 VITALS — BP 146/80 | HR 86 | Resp 17 | Ht 66.0 in | Wt 175.6 lb

## 2018-07-18 DIAGNOSIS — D649 Anemia, unspecified: Secondary | ICD-10-CM | POA: Diagnosis not present

## 2018-07-18 DIAGNOSIS — K5730 Diverticulosis of large intestine without perforation or abscess without bleeding: Secondary | ICD-10-CM | POA: Insufficient documentation

## 2018-07-18 DIAGNOSIS — R2243 Localized swelling, mass and lump, lower limb, bilateral: Secondary | ICD-10-CM | POA: Diagnosis not present

## 2018-07-18 DIAGNOSIS — K5010 Crohn's disease of large intestine without complications: Secondary | ICD-10-CM | POA: Insufficient documentation

## 2018-07-18 DIAGNOSIS — R140 Abdominal distension (gaseous): Secondary | ICD-10-CM | POA: Diagnosis present

## 2018-07-18 DIAGNOSIS — N210 Calculus in bladder: Secondary | ICD-10-CM | POA: Diagnosis not present

## 2018-07-18 DIAGNOSIS — R1031 Right lower quadrant pain: Secondary | ICD-10-CM | POA: Insufficient documentation

## 2018-07-18 LAB — IRON AND TIBC
Iron: 39 ug/dL — ABNORMAL LOW (ref 45–182)
Saturation Ratios: 9 % — ABNORMAL LOW (ref 17.9–39.5)
TIBC: 461 ug/dL — AB (ref 250–450)
UIBC: 422 ug/dL

## 2018-07-18 LAB — BASIC METABOLIC PANEL
Anion gap: 10 (ref 5–15)
BUN: 24 mg/dL — ABNORMAL HIGH (ref 8–23)
CHLORIDE: 105 mmol/L (ref 98–111)
CO2: 32 mmol/L (ref 22–32)
Calcium: 8.6 mg/dL — ABNORMAL LOW (ref 8.9–10.3)
Creatinine, Ser: 1.33 mg/dL — ABNORMAL HIGH (ref 0.61–1.24)
GFR calc Af Amer: >60 mL/min (ref >60)
GFR calc non Af Amer: 52 mL/min — ABNORMAL LOW (ref >60)
GLUCOSE: 110 mg/dL — AB (ref 70–99)
POTASSIUM: 2.9 mmol/L — AB (ref 3.5–5.1)
Sodium: 147 mmol/L — ABNORMAL HIGH (ref 135–145)

## 2018-07-18 LAB — CBC
HCT: 36.8 % — ABNORMAL LOW (ref 40.0–52.0)
HEMOGLOBIN: 12.2 g/dL — AB (ref 13.0–18.0)
MCH: 26.2 pg (ref 26.0–34.0)
MCHC: 33.2 g/dL (ref 32.0–36.0)
MCV: 78.8 fL — ABNORMAL LOW (ref 80.0–100.0)
Platelets: 218 10*3/uL (ref 150–440)
RBC: 4.67 MIL/uL (ref 4.40–5.90)
RDW: 19.6 % — ABNORMAL HIGH (ref 11.5–14.5)
WBC: 7.2 10*3/uL (ref 3.8–10.6)

## 2018-07-18 LAB — FOLATE: FOLATE: 21 ng/mL (ref >5.9)

## 2018-07-18 LAB — TSH: TSH: 9.817 u[IU]/mL — ABNORMAL HIGH (ref 0.350–4.500)

## 2018-07-18 LAB — FERRITIN: FERRITIN: 15 ng/mL — AB (ref 24–336)

## 2018-07-18 LAB — VITAMIN B12: Vitamin B-12: 329 pg/mL (ref 180–914)

## 2018-07-18 MED ORDER — CIPROFLOXACIN HCL 500 MG PO TABS
500.0000 mg | ORAL_TABLET | Freq: Two times a day (BID) | ORAL | 0 refills | Status: AC
Start: 2018-07-18 — End: 2018-08-01

## 2018-07-18 MED ORDER — METRONIDAZOLE 500 MG PO TABS: 500.0000 mg | ORAL_TABLET | Freq: Two times a day (BID) | ORAL | 0 refills | Status: AC

## 2018-07-18 MED ORDER — FUROSEMIDE 20 MG PO TABS: 20.0000 mg | ORAL_TABLET | Freq: Every day | ORAL | 0 refills | Status: DC

## 2018-07-18 MED ORDER — MAGNESIUM-POTASSIUM 40-40 MG PO CAPS: 40.0000 meq | ORAL_CAPSULE | Freq: Every day | ORAL | 0 refills | Status: AC

## 2018-07-18 NOTE — Progress Notes (Signed)
146 80 

## 2018-07-18 NOTE — Progress Notes (Signed)
William Darby, MD [address]  [place]  [place], [place] [address]  Main: [phone]  Fax: [phone]    Gastroenterology Consultation  Referring Provider:     Novella Rob, FNP Primary Care Physician:  Novella Rob, FNP Primary Gastroenterologist:  Dr. Cephas Holland Reason for Consultation:     SCAD, sigmoid diverticulosis        HPI:   William Holland is a 72 y.o. male referred by Dr. Novella Rob, FNP  for consultation & management of heme-positive stool. Patient is accompanied by his brother who is his power of attorney. Patient has been experiencing 3-4 month history of nonbloody diarrhea, unintentional weight loss. He has been having several episodes of loose bowel movements during the day as well as, nocturnal diarrhea. Patient was admitted to [facility] in 2017 secondary to abdominal pain, vomiting and belching, underwent CT which revealed wall thickening along the descending and proximal sigmoid colon, scattered diverticulosis in the descending and sigmoid colon. There was also a focal lesion measuring about 3.2 cm adjacent to proximal to mid sigmoid colon with soft tissue inflammation and focal wall thickening, underlying mass could not be excluded. Patient was empirically treated for diverticulitis with antibiotics and was discharged home.   According to his brother, patient lost about 45 pounds. He weighed 210 pounds in 02/2016, and today he weighs 165 pounds. Patient denies loss of appetite, fever, chills, nausea, vomiting, rectal bleeding, melena, abdominal pain.  Follow-up visit 03/14/2018 Patient underwent stool studies for chronic diarrhea, there is no evidence of infection. He had normocytic anemia. He underwent colonoscopy which revealed severe sigmoid diverticulosis that resulted in moderate narrowing of the sigmoid colon with upstream dilation of the proximal colon This area was traversed with mild resistance. The biopsies  confirmed segmental colitis associated with diverticulosis. There was no evidence of colorectal malignancy. I treated this with 2 weeks course of Cipro and Flagyl. Patient is here for follow-up. He no longer experiences diarrhea, bloating, abdominal pain, nausea, vomiting. He gained 5 pounds since last visit. He reports good appetite. He is accompanied by his brother today who is his caregiver.  Follow up visit 07/18/18 He reports recurrence of diarrhea about 2 weeks ago a/w abdominal distension and few episodes of emesis last week. Going several times daily, nonbloody. He continues to gain weight and has bilateral swelling of legs. He denies abdominal pain. According to his brother who is his caregiver tells me that pt likes to eat and has been snacking more. Overall, he has slow physical activity  NSAIDs: None  Antiplts/Anticoagulants/Anti thrombotics: None  GI Procedures:  Colonoscopy 02/27/2018 - Preparation of the colon was fair. No evidence of malignancy - The examined portion of the ileum was normal. Biopsied. - Stool in the descending colon, in the transverse colon, in the ascending colon and in the cecum. - Likely benign polypoid lesion at the appendiceal orifice. Biopsied. - Stricture in the sigmoid colon. Biopsied. - Normal mucosa in the rectum, in the descending colon, in the transverse colon, in the ascending colon and in the cecum. Biopsied. - Severe diverticulosis in the sigmoid colon, in the descending colon, in the transverse colon and in the ascending colon. There was moderate narrowing of the sigmoid colon in association with the diverticulosis. Peri-diverticular erythema was seen. Biopsied. - One 6 mm polyp in the rectum, removed with a hot snare. Resected and retrieved. DIAGNOSIS:  A. TERMINAL ILEUM; COLD BIOPSY:  - UNREMARKABLE SMALL INTESTINAL  MUCOSA.   B. COLON POLYPOID LESION, APPENDICEAL ORIFICE; COLD BIOPSY:  - POLYPOID FRAGMENT OF MILDLY INFLAMED COLONIC MUCOSA  WITH PROMINENT  LYMPHOID AGGREGATES.  - NEGATIVE FOR CHRONICITY, DYSPLASIA, AND MALIGNANCY.   C. COLON, RANDOM; COLD BIOPSY:  - COLONIC MUCOSA WITH REACTIVE LYMPHOID AGGREGATES.  - NEGATIVE FOR MICROSCOPIC COLITIS, DYSPLASIA, AND MALIGNANCY.   D. COLON, SIGMOID THICKENING; COLD BIOPSY:  - COLONIC MUCOSA WITH FOCAL PROMINENT LYMPHOID AGGREGATE.  - NEGATIVE FOR MICROSCOPIC COLITIS, DYSPLASIA, AND MALIGNANCY.   E. COLON POLYPOID LESION, SIGMOID 20 CM; COLD BIOPSY:  - POLYPOID FRAGMENT OF COLONIC MUCOSA WITH FOCAL MODERATE ACTIVE COLITIS  AND PROMINENT LYMPHOID AGGREGATES, SEE COMMENT.  - NEGATIVE FOR VIRAL CYTOPATHIC EFFECT, DYSPLASIA, AND MALIGNANCY.   F. RECTUM POLYP; HOT SNARE:  - PROMINENT THERMAL ARTIFACT, FAVOR HYPERPLASTIC POLYP.  - DUE TO THERMAL ARTIFACT DEFINITE DYSPLASIA CANNOT BE EXCLUDED.   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   No EGD in the past Mother with colon cancer at age 65 Patient is single, do not have children  No past medical history on file.  Past Surgical History:  Procedure Laterality Date  . APPENDECTOMY    . COLONOSCOPY WITH PROPOFOL N/A 02/27/2018   Procedure: COLONOSCOPY WITH PROPOFOL;  Surgeon: Lin Landsman, MD;  Location: [facility] ENDOSCOPY;  Service: Gastroenterology;  Laterality: N/A;  . EYE SURGERY      Current Outpatient Medications:  .  tamsulosin (FLOMAX) 0.4 MG CAPS capsule, , Disp: , Rfl:  .  ciprofloxacin (CIPRO) 500 MG tablet, Take 1 tablet (500 mg total) by mouth 2 (two) times daily for 14 days., Disp: 28 tablet, Rfl: 0 .  furosemide (LASIX) 20 MG tablet, Take 1 tablet (20 mg total) by mouth daily for 14 days., Disp: 14 tablet, Rfl: 0 .  metroNIDAZOLE (FLAGYL) 500 MG tablet, Take 1 tablet (500 mg total) by mouth 2 (two) times daily for 14 days., Disp: 28 tablet, Rfl: 0   Family History  Problem Relation Age of Onset  .  Cancer Brother   . Cancer Father      Social History   Tobacco Use  . Smoking status: Never Smoker  . Smokeless tobacco: Never Used  Substance Use Topics  . Alcohol use: No    Alcohol/week: 0.0 oz  . Drug use: No    Allergies as of 07/18/2018  . (No Known Allergies)    Review of Systems:    All systems reviewed and negative except where noted in HPI.   Physical Exam:  BP (!) 146/80 (BP Location: Left Arm, Patient Position: Sitting, Cuff Size: Normal)   Pulse 86   Resp 17   Ht 5' 6"  (1.676 m)   Wt 175 lb 9.6 oz (79.7 kg)   BMI 28.34 kg/m  No LMP for male patient.  General:   Alert,  Well-developed, well-nourished, pleasant and cooperative in NAD Head:  Normocephalic and atraumatic. Eyes:  Sclera clear, no icterus.   Conjunctiva pink. Ears:  Normal auditory acuity. Nose:  No deformity, discharge, or lesions. Mouth:  No deformity or lesions,oropharynx pink & moist. Neck:  Supple; no masses or thyromegaly. Lungs:  Respirations even and unlabored.  Clear throughout to auscultation.   No wheezes, crackles, or rhonchi. No acute distress. Heart:  Regular rate and rhythm; no murmurs, clicks, rubs, or gallops. Abdomen:  Normal bowel sounds. Soft, non-tender and moderately distended, tympanic without masses, hepatosplenomegaly  or hernias noted.  No guarding or rebound tenderness.   Rectal: Not performed Msk:  Symmetrical without gross deformities. Good, equal movement & strength bilaterally. Pulses:  Normal pulses noted. Extremities:  No clubbing, 2+ edema.  No cyanosis. Neurologic:  Alert and oriented x3;  grossly normal neurologically. Skin:  Intact without significant lesions or rashes. No jaundice. Lymph Nodes:  No significant cervical adenopathy. Psych:  Alert and cooperative. Normal mood and affect.  Imaging Studies: Reviewed  Assessment and Plan:   Nino Amano is a 72 y.o. male with no significant past medical history, initially seen in consultation for fecal occult  blood positive, about 3 months history of nonbloody diarrhea, unintentional weight loss. He is here for follow-up. Colonoscopy revealed benign narrowing of the sigmoid colon secondary to severe diverticulosis. 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 His symptoms have resolved temporarily with 2 weeks course of Cipro and Flagyl. He now presents with recurrence of abdominal distention, nonbloody diarrhea. He has weight gain as well as bilateral swelling of legs  SCAD, severe sigmoid diverticulosis - Recommend him to take another course of Cipro and Flagyl - x-ray abdomen KUB - check electrolytes - discussed with him about surgical resection of sigmoid colon given recurrent episodes and significant narrowing from severe diverticulosis. Patient's brother who is his healthcare power of attorney is agreeable and we will refer him to [facility]  Normocytic anemia Check CBC, iron studies, B12 and folate levels, TSH  Bilateral swelling of legs Start low-dose Lasix 20 mg daily for 2 weeks Follow-up with primary care doctor   Follow up in 4 weeks   William Darby, MD

## 2018-07-18 NOTE — Progress Notes (Signed)
potassiu

## 2018-07-22 ENCOUNTER — Other Ambulatory Visit: Payer: Self-pay

## 2018-07-22 DIAGNOSIS — K5730 Diverticulosis of large intestine without perforation or abscess without bleeding: Secondary | ICD-10-CM

## 2018-07-29 ENCOUNTER — Ambulatory Visit (INDEPENDENT_AMBULATORY_CARE_PROVIDER_SITE_OTHER): Payer: Medicare Other | Admitting: Surgery

## 2018-07-29 ENCOUNTER — Encounter: Payer: Self-pay | Admitting: Surgery

## 2018-07-29 VITALS — BP 111/66 | HR 92 | Temp 98.7°F | Wt 160.0 lb

## 2018-07-29 DIAGNOSIS — K56699 Other intestinal obstruction unspecified as to partial versus complete obstruction: Secondary | ICD-10-CM

## 2018-07-29 NOTE — Progress Notes (Signed)
Surgical Clinic History and Physical  Referring provider:  Novella Rob, [place], [place] [address]  HISTORY OF PRESENT ILLNESS (HPI):  72 y.o. male presents for evaluation of "recurrent episodes and significant narrowing from severe diverticulosis" with only a single episode of diverticulitis in 2017 and no subsequent pain. Patient's primary complaint appears to be alternating constipation and diarrhea, the latter for which patient was recently treated with antibiotics with complete resolution. Patient's older brother (who self-identifies as patient's caretaker) says patient eats only white bread, peels all fruits and vegetables, and eats more of what he's been advised not to eat while he doesn't eat what he's been advised. Patient reports occasional constipation with more frequently non-bloody loose BM's + N/V, which he says resolved following a course of antibiotics. Though GI note states unintentional weight loss, patient and his brother deny unintentional weight loss. He otherwise denies any fever/chills, CP, or SOB.  PAST MEDICAL HISTORY (PMH):  Past Medical History:  Diagnosis Date  . Diverticulosis, sigmoid 02/27/2018   Colonoscopy done on 02/27/2018 by Dr. Marius Ditch     PAST SURGICAL HISTORY [facility]):  Past Surgical History:  Procedure Laterality Date  . APPENDECTOMY    . COLONOSCOPY WITH PROPOFOL N/A 02/27/2018   Procedure: COLONOSCOPY WITH PROPOFOL;  Surgeon: Lin Landsman, MD;  Location: [facility] ENDOSCOPY;  Service: Gastroenterology;  Laterality: N/A;  . EYE SURGERY       MEDICATIONS:  Prior to Admission medications   Medication Sig Start Date End Date Taking? Authorizing Provider  ciprofloxacin (CIPRO) 500 MG tablet Take 1 tablet (500 mg total) by mouth 2 (two) times daily for 14 days. 07/18/18 08/01/18 Yes Vanga, Tally Due, MD  furosemide (LASIX) 20 MG tablet Take 1 tablet (20 mg total) by mouth daily for 14 days. 07/18/18 08/01/18 Yes Vanga, Tally Due,  MD  metroNIDAZOLE (FLAGYL) 500 MG tablet Take 1 tablet (500 mg total) by mouth 2 (two) times daily for 14 days. 07/18/18 08/01/18 Yes Vanga, Tally Due, MD  potassium chloride SA (K-DUR,KLOR-CON) 20 MEQ tablet TK 2 TS PO ONCE DAILY FOR 3 DAYS 07/18/18  Yes [provider]  tamsulosin (FLOMAX) 0.4 MG CAPS capsule  01/30/18  Yes [provider]     ALLERGIES:  No Known Allergies   SOCIAL HISTORY:  Social History   Socioeconomic History  . Marital status: Single    Spouse name: Not on file  . Number of children: Not on file  . Years of education: Not on file  . Highest education level: Not on file  Occupational History  . Occupation: retired  Scientific laboratory technician  . Financial resource strain: Not on file  . Food insecurity:    Worry: Not on file    Inability: Not on file  . Transportation needs:    Medical: Not on file    Non-medical: Not on file  Tobacco Use  . Smoking status: Never Smoker  . Smokeless tobacco: Never Used  Substance and Sexual Activity  . Alcohol use: No    Alcohol/week: 0.0 oz  . Drug use: No  . Sexual activity: Not on file    Comment: Not Asked  Lifestyle  . Physical activity:    Days per week: Not on file    Minutes per session: Not on file  . Stress: Not on file  Relationships  . Social connections:    Talks on phone: Not on file    Gets together: Not on file    Attends  religious service: Not on file    Active member of club or organization: Not on file    Attends meetings of clubs or organizations: Not on file    Relationship status: Not on file  . Intimate partner violence:    Fear of current or ex partner: Not on file    Emotionally abused: Not on file    Physically abused: Not on file    Forced sexual activity: Not on file  Other Topics Concern  . Not on file  Social History Narrative  . Not on file    The patient currently resides (home / rehab facility / nursing home): Home The patient normally is (ambulatory / bedbound):  Ambulatory  FAMILY HISTORY:  Family History  Problem Relation Age of Onset  . Cancer Brother   . Cancer Father     Otherwise negative/non-contributory.  REVIEW OF SYSTEMS:  Constitutional: denies any other weight loss, fever, chills, or sweats  Eyes: denies any other vision changes, history of eye injury  ENT: denies sore throat, hearing problems  Respiratory: denies shortness of breath, wheezing  Cardiovascular: denies chest pain, palpitations  Gastrointestinal: abdominal pain, N/V, and bowel function as per HPI Musculoskeletal: denies any other joint pains or cramps  Skin: Denies any other rashes or skin discolorations Neurological: denies any other headache, dizziness, weakness  Psychiatric: Denies any other depression, anxiety   All other review of systems were otherwise negative   VITAL SIGNS:  BP 111/66   Pulse 92   Temp 98.7 F (37.1 C) (Oral)   Wt 160 lb (72.6 kg)   BMI 25.82 kg/m   PHYSICAL EXAM:  Constitutional:  -- Normal body habitus  -- Awake, alert, and oriented x3  Eyes:  -- Pupils equally round and reactive to light  -- No scleral icterus  Ear, nose, throat:  -- No jugular venous distension -- No nasal drainage, bleeding Pulmonary:  -- No crackles  -- Equal breath sounds bilaterally -- Breathing non-labored at rest Cardiovascular:  -- S1, S2 present  -- No pericardial rubs  Gastrointestinal:  -- Abdomen soft, nontender, non-distended, no guarding/rebound  -- No abdominal masses appreciated, pulsatile or otherwise  Musculoskeletal and Integumentary:  -- Wounds or skin discoloration: None appreciated -- Extremities: B/L UE and LE FROM, hands and feet warm Neurologic:  -- Motor function: Intact and symmetric -- Sensation: Intact and symmetric  Labs:  CBC Latest Ref Rng & Units 07/18/2018 02/25/2018 03/11/2016  WBC 3.8 - 10.6 K/uL 7.2 5.8 6.9  Hemoglobin 13.0 - 18.0 g/dL 12.2(L) 10.7(L) 12.4(L)  Hematocrit 40.0 - 52.0 % 36.8(L) 33.1(L) 36.4(L)   Platelets 150 - 440 K/uL 218 241 214   CMP Latest Ref Rng & Units 07/18/2018 02/25/2018 03/11/2016  Glucose 70 - 99 mg/dL 110(H) 94 113(H)  BUN 8 - 23 mg/dL 24(H) 18 37(H)  Creatinine 0.61 - 1.24 mg/dL 1.33(H) 1.09 1.23  Sodium 135 - 145 mmol/L 147(H) 144 138  Potassium 3.5 - 5.1 mmol/L 2.9(L) 3.6 4.1  Chloride 98 - 111 mmol/L 105 107 111  CO2 22 - 32 mmol/L 32 27 25  Calcium 8.9 - 10.3 mg/dL 8.6(L) 8.9 7.8(L)  Total Protein 6.5 - 8.1 g/dL - 7.6 -  Total Bilirubin 0.3 - 1.2 mg/dL - 1.2 -  Alkaline Phos 38 - 126 U/L - 63 -  AST 15 - 41 U/L - 23 -  ALT 17 - 63 U/L - 15(L) -   Imaging studies:  Abdominal X-ray (07/18/2018) 1. Significant colonic distention.  This may reflect a diffuse colonic adynamic ileus or distal partial obstruction. 2. No visualized retained capsule or other radiopaque foreign body. 3. Multiple bladder stones.  Colonoscopy (02/27/2018) - The examined portion of the ileum was normal. Biopsied. - Stool in the descending colon, in the transverse colon, in the ascending colon and in the cecum. - Likely benign polypoid lesion at the appendiceal orifice. Biopsied. - Stricture in the sigmoid colon. Biopsied. - Normal mucosa in the rectum, in the descending colon, in the transverse colon, in the ascending colon and in the cecum. Biopsied. - Severe diverticulosis in the sigmoid colon, in the descending colon, in the transverse colon and in the ascending colon. There was narrowing of the sigmoid colon in association with the diverticulosis. Peri-diverticular erythema was seen. Biopsied. - One 6 mm polyp in the rectum, removed with a hot snare.  Assessment/Plan:  72 y.o. 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   - maintain hydration  - discussed increased fiber for constipation and diarrhea (whole grain breads/cereals, vegetables and fruits with the skins) and metamucil if unable or unwilling to eat more fiber +/- once daily Colace stool softener if constipation  - discussed role of sigmoid colectomy for symptoms attributable to obstruction secondary to sigmoid colonic stricture or recurrent diverticulitis  - return to clinic as needed, instructed to call if any questions or concerns  All of the above recommendations were discussed with the patient and patient's brother, and all of patient's and family's questions were answered to their expressed satisfaction.  Thank you for the opportunity to participate in this patient's care.  -- Marilynne Drivers Rosana Hoes, MD, [facility]: [facility] - Partnering for exceptional care. Office: [phone]

## 2018-07-29 NOTE — Patient Instructions (Addendum)
Please give [place] a call in case you have any questions or concerns.   Please eat more fiber or try Metamucil and drink plenty of fluids (water).   Diverticulosis Diverticulosis is a condition that develops when small pouches (diverticula) form in the wall of the large intestine (colon). The colon is where water is absorbed and stool is formed. The pouches form when the inside layer of the colon pushes through weak spots in the outer layers of the colon. You may have a few pouches or many of them. What are the causes? The cause of this condition is not known. What increases the risk? The following factors may make you more likely to develop this condition:  Being older than age 10. Your risk for this condition increases with age. Diverticulosis is rare among people younger than age 76. By age 93, many people have it.  Eating a low-fiber diet.  Having frequent constipation.  Being overweight.  Not getting enough exercise.  Smoking.  Taking over-the-counter pain medicines, like aspirin and ibuprofen.  Having a family history of diverticulosis.  What are the signs or symptoms? In most people, there are no symptoms of this condition. If you do have symptoms, they may include:  Bloating.  Cramps in the abdomen.  Constipation or diarrhea.  Pain in the lower left side of the abdomen.  How is this diagnosed? This condition is most often diagnosed during an exam for other colon problems. Because diverticulosis usually has no symptoms, it often cannot be diagnosed independently. This condition may be diagnosed by:  Using a flexible scope to examine the colon (colonoscopy).  Taking an X-ray of the colon after dye has been put into the colon (barium enema).  Doing a CT scan.  How is this treated? You may not need treatment for this condition if you have never developed an infection related to diverticulosis. If you have had an infection before, treatment may include:  Eating a  high-fiber diet. This may include eating more fruits, vegetables, and grains.  Taking a fiber supplement.  Taking a live bacteria supplement (probiotic).  Taking medicine to relax your colon.  Taking antibiotic medicines.  Follow these instructions at home:  Drink 6-8 glasses of water or more each day to prevent constipation.  Try not to strain when you have a bowel movement.  If you have had an infection before: ? Eat more fiber as directed by your health care provider or your diet and nutrition specialist (dietitian). ? Take a fiber supplement or probiotic, if your health care provider approves.  Take over-the-counter and prescription medicines only as told by your health care provider.  If you were prescribed an antibiotic, take it as told by your health care provider. Do not stop taking the antibiotic even if you start to feel better.  Keep all follow-up visits as told by your health care provider. This is important. Contact a health care provider if:  You have pain in your abdomen.  You have bloating.  You have cramps.  You have not had a bowel movement in 3 days. Get help right away if:  Your pain gets worse.  Your bloating becomes very bad.  You have a fever or chills, and your symptoms suddenly get worse.  You vomit.  You have bowel movements that are bloody or black.  You have bleeding from your rectum. Summary  Diverticulosis is a condition that develops when small pouches (diverticula) form in the wall of the large intestine (colon).  You may have a few pouches or many of them.  This condition is most often diagnosed during an exam for other colon problems.  If you have had an infection related to diverticulosis, treatment may include increasing the fiber in your diet, taking supplements, or taking medicines. This information is not intended to replace advice given to you by your health care provider. Make sure you discuss any questions you have with  your health care provider. Document Released: 09/06/2004 Document Revised: 10/29/2016 Document Reviewed: 10/29/2016 Elsevier Interactive Patient Education  2017 [employer].

## 2018-08-27 ENCOUNTER — Encounter: Payer: Self-pay | Admitting: Gastroenterology

## 2018-08-27 ENCOUNTER — Ambulatory Visit: Payer: Medicare Other | Admitting: Gastroenterology

## 2018-08-27 DIAGNOSIS — K5730 Diverticulosis of large intestine without perforation or abscess without bleeding: Secondary | ICD-10-CM

## 2019-01-05 ENCOUNTER — Emergency Department: Payer: Medicare Other

## 2019-01-05 ENCOUNTER — Other Ambulatory Visit: Payer: Self-pay

## 2019-01-05 ENCOUNTER — Inpatient Hospital Stay
Admission: EM | Admit: 2019-01-05 | Discharge: 2019-01-13 | DRG: 391 | Disposition: A | Discharge status: Home Health | Payer: Medicare Other | Attending: Internal Medicine | Admitting: Internal Medicine

## 2019-01-05 DIAGNOSIS — N3020 Other chronic cystitis without hematuria: Secondary | ICD-10-CM | POA: Diagnosis present

## 2019-01-05 DIAGNOSIS — Z23 Encounter for immunization: Secondary | ICD-10-CM | POA: Diagnosis not present

## 2019-01-05 DIAGNOSIS — E039 Hypothyroidism, unspecified: Secondary | ICD-10-CM | POA: Diagnosis present

## 2019-01-05 DIAGNOSIS — M7122 Synovial cyst of popliteal space [Baker], left knee: Secondary | ICD-10-CM | POA: Diagnosis present

## 2019-01-05 DIAGNOSIS — R945 Abnormal results of liver function studies: Secondary | ICD-10-CM | POA: Diagnosis not present

## 2019-01-05 DIAGNOSIS — E162 Hypoglycemia, unspecified: Secondary | ICD-10-CM | POA: Diagnosis present

## 2019-01-05 DIAGNOSIS — E876 Hypokalemia: Secondary | ICD-10-CM

## 2019-01-05 DIAGNOSIS — D638 Anemia in other chronic diseases classified elsewhere: Secondary | ICD-10-CM | POA: Diagnosis present

## 2019-01-05 DIAGNOSIS — Z79899 Other long term (current) drug therapy: Secondary | ICD-10-CM

## 2019-01-05 DIAGNOSIS — K529 Noninfective gastroenteritis and colitis, unspecified: Secondary | ICD-10-CM | POA: Diagnosis present

## 2019-01-05 DIAGNOSIS — M6282 Rhabdomyolysis: Secondary | ICD-10-CM | POA: Diagnosis present

## 2019-01-05 DIAGNOSIS — I361 Nonrheumatic tricuspid (valve) insufficiency: Secondary | ICD-10-CM | POA: Diagnosis not present

## 2019-01-05 DIAGNOSIS — L89151 Pressure ulcer of sacral region, stage 1: Secondary | ICD-10-CM | POA: Diagnosis present

## 2019-01-05 DIAGNOSIS — K631 Perforation of intestine (nontraumatic): Secondary | ICD-10-CM | POA: Diagnosis not present

## 2019-01-05 DIAGNOSIS — N179 Acute kidney failure, unspecified: Secondary | ICD-10-CM | POA: Diagnosis present

## 2019-01-05 DIAGNOSIS — R601 Generalized edema: Secondary | ICD-10-CM | POA: Diagnosis not present

## 2019-01-05 DIAGNOSIS — I5023 Acute on chronic systolic (congestive) heart failure: Secondary | ICD-10-CM | POA: Diagnosis present

## 2019-01-05 DIAGNOSIS — I5021 Acute systolic (congestive) heart failure: Secondary | ICD-10-CM

## 2019-01-05 DIAGNOSIS — I509 Heart failure, unspecified: Secondary | ICD-10-CM

## 2019-01-05 DIAGNOSIS — R778 Other specified abnormalities of plasma proteins: Secondary | ICD-10-CM

## 2019-01-05 DIAGNOSIS — E871 Hypo-osmolality and hyponatremia: Secondary | ICD-10-CM | POA: Diagnosis present

## 2019-01-05 DIAGNOSIS — R197 Diarrhea, unspecified: Secondary | ICD-10-CM | POA: Diagnosis not present

## 2019-01-05 DIAGNOSIS — R740 Nonspecific elevation of levels of transaminase and lactic acid dehydrogenase [LDH]: Secondary | ICD-10-CM | POA: Diagnosis present

## 2019-01-05 DIAGNOSIS — I428 Other cardiomyopathies: Secondary | ICD-10-CM | POA: Diagnosis present

## 2019-01-05 DIAGNOSIS — K5720 Diverticulitis of large intestine with perforation and abscess without bleeding: Secondary | ICD-10-CM | POA: Diagnosis present

## 2019-01-05 DIAGNOSIS — E870 Hyperosmolality and hypernatremia: Secondary | ICD-10-CM | POA: Diagnosis present

## 2019-01-05 DIAGNOSIS — R7989 Other specified abnormal findings of blood chemistry: Secondary | ICD-10-CM | POA: Diagnosis present

## 2019-01-05 DIAGNOSIS — L8990 Pressure ulcer of unspecified site, unspecified stage: Secondary | ICD-10-CM

## 2019-01-05 DIAGNOSIS — F1010 Alcohol abuse, uncomplicated: Secondary | ICD-10-CM | POA: Diagnosis not present

## 2019-01-05 DIAGNOSIS — R296 Repeated falls: Secondary | ICD-10-CM | POA: Diagnosis present

## 2019-01-05 DIAGNOSIS — N210 Calculus in bladder: Secondary | ICD-10-CM | POA: Diagnosis present

## 2019-01-05 DIAGNOSIS — R350 Frequency of micturition: Secondary | ICD-10-CM | POA: Diagnosis not present

## 2019-01-05 DIAGNOSIS — I248 Other forms of acute ischemic heart disease: Secondary | ICD-10-CM | POA: Diagnosis not present

## 2019-01-05 DIAGNOSIS — K5669 Other partial intestinal obstruction: Secondary | ICD-10-CM | POA: Diagnosis present

## 2019-01-05 DIAGNOSIS — I370 Nonrheumatic pulmonary valve stenosis: Secondary | ICD-10-CM | POA: Diagnosis not present

## 2019-01-05 DIAGNOSIS — E8809 Other disorders of plasma-protein metabolism, not elsewhere classified: Secondary | ICD-10-CM | POA: Diagnosis present

## 2019-01-05 HISTORY — DX: Childhood onset fluency disorder: F80.81

## 2019-01-05 LAB — URINALYSIS, COMPLETE (UACMP) WITH MICROSCOPIC
Bacteria, UA: NONE SEEN
Bilirubin Urine: NEGATIVE
Glucose, UA: NEGATIVE mg/dL
Ketones, ur: NEGATIVE mg/dL
Nitrite: NEGATIVE
PH: 7 (ref 5.0–8.0)
Protein, ur: NEGATIVE mg/dL
SPECIFIC GRAVITY, URINE: 1.005 (ref 1.005–1.030)
WBC, UA: >50 WBC/hpf — ABNORMAL HIGH (ref 0–5)

## 2019-01-05 LAB — CBC
HEMATOCRIT: 35.1 % — AB (ref 39.0–52.0)
HEMOGLOBIN: 11 g/dL — AB (ref 13.0–17.0)
MCH: 26.4 pg (ref 26.0–34.0)
MCHC: 31.3 g/dL (ref 30.0–36.0)
MCV: 84.2 fL (ref 80.0–100.0)
NRBC: 0 % (ref 0.0–0.2)
Platelets: 201 10*3/uL (ref 150–400)
RBC: 4.17 MIL/uL — AB (ref 4.22–5.81)
RDW: 17.2 % — ABNORMAL HIGH (ref 11.5–15.5)
WBC: 7.8 10*3/uL (ref 4.0–10.5)

## 2019-01-05 LAB — HEPATIC FUNCTION PANEL
ALT: 120 U/L — AB (ref 0–44)
AST: 333 U/L — AB (ref 15–41)
Albumin: 2.9 g/dL — ABNORMAL LOW (ref 3.5–5.0)
Alkaline Phosphatase: 74 U/L (ref 38–126)
BILIRUBIN INDIRECT: 1 mg/dL — AB (ref 0.3–0.9)
Bilirubin, Direct: 0.4 mg/dL — ABNORMAL HIGH (ref 0.0–0.2)
TOTAL PROTEIN: 5.8 g/dL — AB (ref 6.5–8.1)
Total Bilirubin: 1.4 mg/dL — ABNORMAL HIGH (ref 0.3–1.2)

## 2019-01-05 LAB — BASIC METABOLIC PANEL
Anion gap: 9 (ref 5–15)
BUN: 20 mg/dL (ref 8–23)
CO2: 36 mmol/L — ABNORMAL HIGH (ref 22–32)
Calcium: 7.4 mg/dL — ABNORMAL LOW (ref 8.9–10.3)
Chloride: 102 mmol/L (ref 98–111)
Creatinine, Ser: 1.3 mg/dL — ABNORMAL HIGH (ref 0.61–1.24)
GFR calc non Af Amer: 55 mL/min — ABNORMAL LOW (ref >60)
Glucose, Bld: 93 mg/dL (ref 70–99)
SODIUM: 147 mmol/L — AB (ref 135–145)

## 2019-01-05 LAB — GASTROINTESTINAL PANEL BY PCR, STOOL (REPLACES STOOL CULTURE)
Adenovirus F40/41: NOT DETECTED
Astrovirus: NOT DETECTED
Campylobacter species: NOT DETECTED
Cryptosporidium: NOT DETECTED
Cyclospora cayetanensis: NOT DETECTED
ENTEROAGGREGATIVE E COLI (EAEC): NOT DETECTED
ENTEROPATHOGENIC E COLI (EPEC): NOT DETECTED
ENTEROTOXIGENIC E COLI (ETEC): NOT DETECTED
Entamoeba histolytica: NOT DETECTED
GIARDIA LAMBLIA: NOT DETECTED
NOROVIRUS GI/GII: NOT DETECTED
Plesimonas shigelloides: NOT DETECTED
ROTAVIRUS A: NOT DETECTED
SALMONELLA SPECIES: NOT DETECTED
SHIGA LIKE TOXIN PRODUCING E COLI (STEC): NOT DETECTED
SHIGELLA/ENTEROINVASIVE E COLI (EIEC): NOT DETECTED
Sapovirus (I, II, IV, and V): NOT DETECTED
Vibrio cholerae: NOT DETECTED
Vibrio species: NOT DETECTED
Yersinia enterocolitica: NOT DETECTED

## 2019-01-05 LAB — TSH: TSH: 7.692 u[IU]/mL — ABNORMAL HIGH (ref 0.350–4.500)

## 2019-01-05 LAB — POTASSIUM: Potassium: <2 mmol/L — CL (ref 3.5–5.1)

## 2019-01-05 LAB — TROPONIN I
Troponin I: 0.16 ng/mL (ref <0.03)
Troponin I: 0.16 ng/mL (ref <0.03)
Troponin I: 0.16 ng/mL (ref <0.03)
Troponin I: 0.16 ng/mL (ref <0.03)

## 2019-01-05 LAB — C DIFFICILE QUICK SCREEN W PCR REFLEX
C DIFFICILE (CDIFF) INTERP: NOT DETECTED
C DIFFICILE (CDIFF) TOXIN: NEGATIVE
C Diff antigen: NEGATIVE

## 2019-01-05 LAB — BRAIN NATRIURETIC PEPTIDE: B NATRIURETIC PEPTIDE 5: 2927 pg/mL — AB (ref 0.0–100.0)

## 2019-01-05 LAB — LACTIC ACID, PLASMA
Lactic Acid, Venous: 1 mmol/L (ref 0.5–1.9)
Lactic Acid, Venous: 0.9 mmol/L (ref 0.5–1.9)

## 2019-01-05 LAB — PHOSPHORUS: Phosphorus: 2.3 mg/dL — ABNORMAL LOW (ref 2.5–4.6)

## 2019-01-05 LAB — HEMOGLOBIN A1C
Hgb A1c MFr Bld: 5.2 % (ref 4.8–5.6)
Mean Plasma Glucose: 102.54 mg/dL

## 2019-01-05 LAB — MAGNESIUM: Magnesium: 1.8 mg/dL (ref 1.7–2.4)

## 2019-01-05 LAB — CK: Total CK: 7292 U/L — ABNORMAL HIGH (ref 49–397)

## 2019-01-05 MED ORDER — ENOXAPARIN SODIUM 40 MG/0.4ML ~~LOC~~ SOLN
40.0000 mg | SUBCUTANEOUS | Status: DC
  Administered 2019-01-05 – 2019-01-12 (×8): 40 mg via SUBCUTANEOUS
  Filled 2019-01-05 (×8): qty 0.4

## 2019-01-05 MED ORDER — POTASSIUM CHLORIDE 10 MEQ/100ML IV SOLN
10.0000 meq | INTRAVENOUS | Status: AC
  Administered 2019-01-06 (×4): 10 meq via INTRAVENOUS
  Filled 2019-01-05 (×4): qty 100

## 2019-01-05 MED ORDER — PIPERACILLIN-TAZOBACTAM 3.375 G IVPB 30 MIN
3.3750 g | Freq: Once | INTRAVENOUS | Status: AC
  Administered 2019-01-05: 3.375 g via INTRAVENOUS
  Filled 2019-01-05: qty 50

## 2019-01-05 MED ORDER — INFLUENZA VAC SPLIT HIGH-DOSE 0.5 ML IM SUSY
0.5000 mL | PREFILLED_SYRINGE | INTRAMUSCULAR | Status: DC | PRN
  Filled 2019-01-05: qty 0.5

## 2019-01-05 MED ORDER — ACETAMINOPHEN 650 MG RE SUPP: 650.0000 mg | Freq: Four times a day (QID) | RECTAL | Status: DC | PRN

## 2019-01-05 MED ORDER — FUROSEMIDE 10 MG/ML IJ SOLN
40.0000 mg | Freq: Two times a day (BID) | INTRAMUSCULAR | Status: DC
  Administered 2019-01-05: 40 mg via INTRAVENOUS
  Filled 2019-01-05: qty 4

## 2019-01-05 MED ORDER — PNEUMOCOCCAL VAC POLYVALENT 25 MCG/0.5ML IJ INJ
0.5000 mL | INJECTION | INTRAMUSCULAR | Status: AC | PRN
  Administered 2019-01-11: 0.5 mL via INTRAMUSCULAR
  Filled 2019-01-05: qty 0.5

## 2019-01-05 MED ORDER — K PHOS MONO-SOD PHOS DI & MONO 155-852-130 MG PO TABS
500.0000 mg | ORAL_TABLET | ORAL | Status: AC
  Administered 2019-01-05 (×2): 500 mg via ORAL
  Filled 2019-01-05 (×2): qty 2

## 2019-01-05 MED ORDER — IOHEXOL 300 MG/ML  SOLN
100.0000 mL | Freq: Once | INTRAMUSCULAR | Status: AC | PRN
  Administered 2019-01-05: 100 mL via INTRAVENOUS

## 2019-01-05 MED ORDER — POTASSIUM CHLORIDE CRYS ER 20 MEQ PO TBCR
40.0000 meq | EXTENDED_RELEASE_TABLET | Freq: Once | ORAL | Status: AC
  Administered 2019-01-05: 40 meq via ORAL
  Filled 2019-01-05: qty 2

## 2019-01-05 MED ORDER — ACETAMINOPHEN 325 MG PO TABS: 650.0000 mg | ORAL_TABLET | Freq: Four times a day (QID) | ORAL | Status: DC | PRN

## 2019-01-05 MED ORDER — ONDANSETRON HCL 4 MG/2ML IJ SOLN
4.0000 mg | Freq: Four times a day (QID) | INTRAMUSCULAR | Status: DC | PRN
  Administered 2019-01-07: 4 mg via INTRAVENOUS
  Filled 2019-01-05: qty 2

## 2019-01-05 MED ORDER — K PHOS MONO-SOD PHOS DI & MONO 155-852-130 MG PO TABS
500.0000 mg | ORAL_TABLET | ORAL | Status: DC
  Filled 2019-01-05 (×3): qty 2

## 2019-01-05 MED ORDER — ONDANSETRON HCL 4 MG PO TABS: 4.0000 mg | ORAL_TABLET | Freq: Four times a day (QID) | ORAL | Status: DC | PRN

## 2019-01-05 MED ORDER — POTASSIUM CHLORIDE 10 MEQ/100ML IV SOLN
10.0000 meq | INTRAVENOUS | Status: AC
  Administered 2019-01-05 (×4): 10 meq via INTRAVENOUS
  Filled 2019-01-05 (×5): qty 100

## 2019-01-05 MED ORDER — FUROSEMIDE 10 MG/ML IJ SOLN: 40.0000 mg | Freq: Two times a day (BID) | INTRAMUSCULAR | Status: DC

## 2019-01-05 MED ORDER — POTASSIUM CHLORIDE IN NACL 40-0.9 MEQ/L-% IV SOLN
INTRAVENOUS | Status: DC
  Filled 2019-01-05: qty 1000

## 2019-01-05 MED ORDER — MAGNESIUM SULFATE 2 GM/50ML IV SOLN
2.0000 g | Freq: Once | INTRAVENOUS | Status: AC
  Administered 2019-01-05: 2 g via INTRAVENOUS
  Filled 2019-01-05: qty 50

## 2019-01-05 MED ORDER — TAMSULOSIN HCL 0.4 MG PO CAPS
0.4000 mg | ORAL_CAPSULE | Freq: Every day | ORAL | Status: DC
  Administered 2019-01-05 – 2019-01-13 (×9): 0.4 mg via ORAL
  Filled 2019-01-05 (×9): qty 1

## 2019-01-05 MED ORDER — POTASSIUM CHLORIDE CRYS ER 20 MEQ PO TBCR
40.0000 meq | EXTENDED_RELEASE_TABLET | ORAL | Status: AC
  Administered 2019-01-05 – 2019-01-06 (×2): 40 meq via ORAL
  Filled 2019-01-05 (×2): qty 2

## 2019-01-05 MED ORDER — PIPERACILLIN-TAZOBACTAM 3.375 G IVPB
3.3750 g | Freq: Three times a day (TID) | INTRAVENOUS | Status: DC
  Administered 2019-01-05 – 2019-01-11 (×17): 3.375 g via INTRAVENOUS
  Filled 2019-01-05 (×17): qty 50

## 2019-01-05 NOTE — H&P (Signed)
[facility] NAME: William Holland    MR#:  [id number]  DATE OF BIRTH:  04-Oct-1946  DATE OF ADMISSION:  01/05/2019  PRIMARY CARE PHYSICIAN: Novella Rob, FNP   REQUESTING/REFERRING PHYSICIAN: Dr. Eula Listen  CHIEF COMPLAINT:   Chief Complaint  Patient presents with  . Leg Swelling  . Weakness    HISTORY OF PRESENT ILLNESS:  William Holland  is a 73 y.o. 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 Patient says on an average that he has anywhere between 4-10 stools every day.  Sometimes it is liquidy and sometimes it semi-formed.  He has had all work-up done for diarrhea as outpatient with gastroenterologist and even had a colonoscopy that showed diverticulosis in the sigmoid region.  Denies any fevers or chills.  Has been eating and drinking well for the last several days but still feeling very weak.  In the last 4 days he has noticed that his legs were swollen and has been having trouble with balance and has several falls.  Denies any syncope or loss of consciousness.  No chest pain or abdominal pain. He is noted to have elevated BNP and troponin in the ED.  No significant EKG changes.  However CT of the abdomen revealing possible bowel perforation and free air in the abdomen.  Patient is nontoxic-appearing.  PAST MEDICAL HISTORY:   Past Medical History:  Diagnosis Date  . Diverticulosis, sigmoid 02/27/2018   Colonoscopy done on 02/27/2018 by Dr. Marius Ditch  . Stuttering during school years     PAST SURGICAL HISTORY:   Past Surgical History:  Procedure Laterality Date  . APPENDECTOMY    . COLONOSCOPY WITH PROPOFOL N/A 02/27/2018   Procedure: COLONOSCOPY WITH PROPOFOL;  Surgeon: Lin Landsman, MD;  Location: [facility]  ENDOSCOPY;  Service: Gastroenterology;  Laterality: N/A;  . EYE SURGERY      SOCIAL HISTORY:   Social History   Tobacco Use  . Smoking status: Never Smoker  . Smokeless tobacco: Never Used  Substance Use Topics  . Alcohol use: No    Alcohol/week: 0.0 standard drinks    FAMILY HISTORY:   Family History  Problem Relation Age of Onset  . Cancer Brother   . Cancer Father   . Uterine cancer Mother     DRUG ALLERGIES:  No Known Allergies  REVIEW OF SYSTEMS:   Review of Systems  Constitutional: Positive for malaise/fatigue. Negative for chills, fever and weight loss.  HENT: Negative for ear discharge, ear pain, hearing loss and nosebleeds.   Eyes: Negative for blurred vision, double vision and photophobia.  Respiratory: Negative for cough, hemoptysis, shortness of breath and wheezing.   Cardiovascular: Positive for leg swelling. Negative for chest pain, palpitations and orthopnea.  Gastrointestinal: Positive for diarrhea. Negative for abdominal pain, constipation, heartburn, melena, nausea and vomiting.  Genitourinary: Negative for dysuria, frequency, hematuria and urgency.  Musculoskeletal: Positive for falls. Negative for back pain, myalgias and neck pain.  Skin: Negative for rash.  Neurological: Negative for dizziness, tingling, sensory change, speech change, focal weakness and headaches.  Endo/Heme/Allergies: Does not bruise/bleed easily.  Psychiatric/Behavioral: Negative for depression.    MEDICATIONS AT HOME:   Prior to Admission medications   Medication Sig Start Date End Date Taking? Authorizing Provider  furosemide (LASIX) 20 MG  tablet Take 1 tablet (20 mg total) by mouth daily for 14 days. 07/18/18 08/01/18  Lin Landsman, MD  potassium chloride SA (K-DUR,KLOR-CON) 20 MEQ tablet TK 2 TS PO ONCE DAILY FOR 3 DAYS 07/18/18   [provider]  tamsulosin (FLOMAX) 0.4 MG CAPS capsule  01/30/18   [provider]      VITAL SIGNS:  Blood pressure  123/64, pulse 77, temperature 97.9 F (36.6 C), temperature source Oral, resp. rate 20, height 5' 9"  (1.753 m), weight 72.6 kg, SpO2 95 %.  PHYSICAL EXAMINATION:   Physical Exam  GENERAL:  73 y.o.-year-old patient lying in the bed with no acute distress.  EYES: Pupils equal, round, reactive to light and accommodation. No scleral icterus. Extraocular muscles intact.  HEENT: Head atraumatic, normocephalic. Oropharynx and nasopharynx clear.  NECK:  Supple, no jugular venous distention. No thyroid enlargement, no tenderness.  LUNGS: Normal breath sounds bilaterally, no wheezing, rales,rhonchi or crepitation. No use of accessory muscles of respiration.  Decreased bibasilar breath sounds CARDIOVASCULAR: S1, S2 normal. No  rubs, or gallops.  2/6 systolic murmur is present ABDOMEN: Abdomen is distended but nontender, hypoactive bowel sounds present. No organomegaly or mass.  EXTREMITIES: No  cyanosis, or clubbing.  2+ lower extremity edema noted NEUROLOGIC: Cranial nerves II through XII are intact. Muscle strength 5/5 in all extremities. Sensation intact. Gait not checked.  PSYCHIATRIC: The patient is alert and oriented x 3. Slow stuttered speech at times. Slow intellectually per family. SKIN: No obvious rash, lesion, or ulcer.   LABORATORY PANEL:   CBC Recent Labs  Lab 01/05/19 1037  WBC 7.8  HGB 11.0*  HCT 35.1*  PLT 201   ------------------------------------------------------------------------------------------------------------------  Chemistries  Recent Labs  Lab 01/05/19 1037 01/05/19 1320  NA 147*  --   K <2.0*  --   CL 102  --   CO2 36*  --   GLUCOSE 93  --   BUN 20  --   CREATININE 1.30*  --   CALCIUM 7.4*  --   AST  --  333*  ALT  --  120*  ALKPHOS  --  74  BILITOT  --  1.4*   ------------------------------------------------------------------------------------------------------------------  Cardiac Enzymes Recent Labs  Lab 01/05/19 1320  TROPONINI 0.16*    ------------------------------------------------------------------------------------------------------------------  RADIOLOGY:  Dg Chest 2 View  Result Date: 01/05/2019 CLINICAL DATA:  Falling. Increased weakness. Lower extremity swelling. EXAM: CHEST - 2 VIEW COMPARISON:  CT abdomen 03/10/2016. FINDINGS: Cardiomegaly with mild bilateral from interstitial prominence consistent with mild interstitial edema. No pleural effusion or pneumothorax. Prominent colonic distention consistent with colonic ileus or colonic obstruction. Abdominal series suggested for further evaluation. IMPRESSION: 1. Cardiomegaly with bilateral pulmonary interstitial prominence consistent with congestive heart failure and mild bilateral interstitial edema. 2. Prominent colonic distention consistent with colonic ileus or colonic obstruction. Abdominal series suggested for further evaluation. Electronically Signed   By: Marcello Moores  Register   On: 01/05/2019 11:01   Ct Head Wo Contrast  Result Date: 01/05/2019 CLINICAL DATA:  Multiple falls in the past 2 weeks due to increased weakness. Bilateral lower extremity swelling. EXAM: CT HEAD WITHOUT CONTRAST TECHNIQUE: Contiguous axial images were obtained from the base of the skull through the vertex without intravenous contrast. COMPARISON:  None. FINDINGS: Brain: There is no evidence of acute infarct, intracranial hemorrhage, mass, midline shift, or extra-axial fluid collection. Mild cerebral atrophy is within normal limits for age. Vascular: Calcified atherosclerosis at the skull base. No hyperdense vessel. Skull: No fracture or focal  osseous lesion. Sinuses/Orbits: Opacification of a posterior left ethmoid air cell. Minimal focal mucosal thickening or tiny mucous retention cyst in the right sphenoid sinus. Clear mastoid air cells. Left cataract extraction. Left scleral buckle. Other: Partially visualized 10 mm subcutaneous nodule in the posterior upper neck just left of midline, possibly a  sebaceous cyst but incompletely evaluated. IMPRESSION: Unremarkable CT appearance of the brain for age. Electronically Signed   By: Logan Bores M.D.   On: 01/05/2019 13:48   Ct Abdomen Pelvis W Contrast  Result Date: 01/05/2019 CLINICAL DATA:  Abdominal bloating.  Abdominal infection. EXAM: CT ABDOMEN AND PELVIS WITH CONTRAST TECHNIQUE: Multidetector CT imaging of the abdomen and pelvis was performed using the standard protocol following bolus administration of intravenous contrast. CONTRAST:  148m OMNIPAQUE IOHEXOL 300 MG/ML  SOLN COMPARISON:  Radiographs dated 07/18/2018 and CT scan dated 03/10/2016 FINDINGS: Lower chest: New small right pleural effusion. Heart size is normal. Tiny hiatal hernia. Hepatobiliary: No focal liver abnormality is seen. No gallstones, gallbladder wall thickening, or biliary dilatation. Pancreas: Unremarkable. No pancreatic ductal dilatation or surrounding inflammatory changes. Spleen: Normal in size without focal abnormality. Adrenals/Urinary Tract: There is a single bubble of air in a calyx of the mid left kidney. 9 mm cyst in the mid right kidney. Exophytic 3.2 cm cyst in the mid left kidney. There are numerous large stones in the bladder, increased since 2017. There is air in the bladder, which was also demonstrated on the prior exam of 2017. Stomach/Bowel: There are multiple small bubbles of free air scattered throughout the abdomen. There appears to be a site of perforation of the sigmoid portion of the colon at the site of transition from gaseous distention to narrowing and thickening of the mucosa of the sigmoid. This is best seen on images 76 and 77 of series 7 and image 65 of series 2. There is marked gaseous distention of the colon from the cecum to the proximal sigmoid region. There are multiple diverticula in the sigmoid region just distal to the transition. Vascular/Lymphatic: Slight aortic atherosclerosis. No enlarged abdominal or pelvic lymph nodes. Reproductive:  Prostate is unremarkable. Other: Diffuse prominent anasarca. There is only a tiny amount of ascites in the abdomen. Musculoskeletal: No acute or significant osseous findings. IMPRESSION: 1. Free air in the abdomen consistent with bowel perforation. The site of perforation appears to be in the mid sigmoid region at the transition point between gaseous distention and bowel wall thickening of the colon. The site of transition could represent inflammation or tumor in the colon. 2. Air in the bladder and left renal collecting system with numerous stones in the bladder. 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 The patient had stones and air in the bladder on the prior study of 2017. 3. New small right pleural effusion. 4. New extensive anasarca. Critical Value/emergent results were called by telephone at the time of interpretation on 01/05/2019 at 1:21 pm to Dr. PCorinna Capra, who verbally acknowledged these results. Electronically Signed   By: JLorriane ShireM.D.   On: 01/05/2019 13:26   UKoreaVenous Img Lower Bilateral  Result Date: 01/05/2019 CLINICAL DATA:  Bilateral lower extremity swelling. EXAM: BILATERAL LOWER EXTREMITY VENOUS DOPPLER ULTRASOUND TECHNIQUE: Gray-scale sonography with graded compression, as well as color Doppler and duplex ultrasound were performed to evaluate the lower extremity deep venous systems from the level of the common femoral vein and including the common femoral, femoral, profunda femoral, popliteal and calf veins including the posterior tibial, peroneal and gastrocnemius veins  when visible. The superficial great saphenous vein was also interrogated. Spectral Doppler was utilized to evaluate flow at rest and with distal augmentation maneuvers in the common femoral, femoral and popliteal veins. COMPARISON:  None. FINDINGS: RIGHT LOWER EXTREMITY Common Femoral Vein: No evidence of thrombus. Normal compressibility, respiratory phasicity and response to augmentation. Saphenofemoral  Junction: No evidence of thrombus. Normal compressibility and flow on color Doppler imaging. Profunda Femoral Vein: No evidence of thrombus. Normal compressibility and flow on color Doppler imaging. Femoral Vein: No evidence of thrombus. Normal compressibility, respiratory phasicity and response to augmentation. Popliteal Vein: No evidence of thrombus. Normal compressibility, respiratory phasicity and response to augmentation. Calf Veins: Visualized right deep calf veins are patent without thrombus. Other Findings:  None. LEFT LOWER EXTREMITY Common Femoral Vein: No evidence of thrombus. Normal compressibility, respiratory phasicity and response to augmentation. Saphenofemoral Junction: No evidence of thrombus. Normal compressibility and flow on color Doppler imaging. Profunda Femoral Vein: No evidence of thrombus. Normal compressibility and flow on color Doppler imaging. Femoral Vein: No evidence of thrombus. Normal compressibility, respiratory phasicity and response to augmentation. Popliteal Vein: No evidence of thrombus. Normal compressibility, respiratory phasicity and response to augmentation. Calf Veins: Visualized left deep calf veins are patent without thrombus. Other Findings: Hypoechoic collection in the left popliteal fossa is suggestive for a complex fluid collection. This structure measures 7.7 x 1.3 x 2.2 cm. Findings are most compatible with a small complex Baker's cyst. IMPRESSION: No evidence of deep venous thrombosis in the lower extremities. Small complex fluid collection in the left popliteal fossa. Findings are compatible with a Baker's cyst. Electronically Signed   By: Markus Daft M.D.   On: 01/05/2019 14:27    EKG:   Orders placed or performed during the hospital encounter of 01/05/19  . ED EKG within 10 minutes  . ED EKG within 10 minutes  . EKG 12-Lead  . EKG 12-Lead    IMPRESSION AND PLAN:   William Holland  is a 73 y.o. 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  1.  Acute CHF exacerbation-no prior history of CHF-no known history of CHF. -Check echocardiogram.  Start IV Lasix-monitor carefully especially with significantly low potassium -Cardiology consult requested. Strict intake and output monitoring - daily weights  2.  Elevated troponin-could be demand ischemia due to CHF. -Patient denies any chest pain.  No significant EKG changes.  Recycle troponins.  Hold off on heparin drip for now -Echocardiogram ordered.  Follow-up with cardiology  3.  Perforation of bowel-known history of significant diverticulosis.  Last colonoscopy in March 2019 -Likely diverticular perforation.  Nontoxic-appearing, no signs of peritonitis -Surgical consult.  Zosyn empirically, we will keep n.p.o. -Continue to monitor, likely conservative management given other acute illnesses.  4.  Hypokalemia-being replaced.  Mostly IV replacements due to bowel perforation.  Check magnesium level. -Pharmacy consult for electrolyte management  5.  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  Could be liver cirrhosis with elevated bilirubin and AST greater than ALT. -Ultrasound of the liver ordered  6.  DVT prophylaxis-on Lovenox   All the records are reviewed and case discussed with ED provider. Management plans discussed with the patient, family and they are in agreement.  CODE STATUS: Full Code  TOTAL CRITICAL CARE TIME SPENT IN TAKING CARE OF THIS PATIENT: 59 minutes.  Gladstone Lighter M.D on 01/05/2019 at 2:36 PM  Between 7am to 6pm - Pager - [phone]  After 6pm go to [email or web address] - password [email or web address] [facility]  Office  [phone]  CC: Primary care physician; Novella Rob, FNP

## 2019-01-05 NOTE — ED Notes (Signed)
Patient brief/ linen/gown changed and cleaned due to urinary incontinence

## 2019-01-05 NOTE — Consult Note (Addendum)
PHARMACY CONSULT NOTE - FOLLOW UP  Pharmacy Consult for Electrolyte Monitoring and Replacement   Recent Labs: Potassium (mmol/L)  Date Value  01/05/2019 <2.0 (LL)   Calcium (mg/dL)  Date Value  01/05/2019 7.4 (L)   Albumin (g/dL)  Date Value  01/05/2019 2.9 (L)   Sodium (mmol/L)  Date Value  01/05/2019 147 (H)     Assessment: 73 y.o. 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 Patient says on an average that he has anywhere between 4-10 stools every day.  CT of the abdomen revealing possible bowel perforation and free air in the abdomen.  Goal of Therapy:  K ~ 4.0 Mg ~ 2.0 Phos 2.5 - 4.5  Plan:  MD ordered KCl 40 mEq PO once and KCl 10 mEq IV every 1 hours for 4 doses.  Will recheck K level at 2000.  Mg - 1.8, will order Magnesium Sulf 2 gm IV once.  Phos - 2.3, will order K-Phos Neutral 2 tablets every 4 hours for 2 doses.  Will recheck BMP, Phos, and Mg with morning labs  Forrest Moron ,PharmD Clinical Pharmacist 01/05/2019 2:49 PM

## 2019-01-05 NOTE — ED Notes (Signed)
Patient transported to CT 

## 2019-01-05 NOTE — Consult Note (Signed)
Cardiology Consultation:   Patient ID: William Holland; [id number]; [birth year]   Admit date: 01/05/2019 Date of Consult: 01/05/2019  Primary Care Provider: Novella Rob, FNP Primary Cardiologist: new to [facility] - consult by William Holland   Patient Profile:   William Holland is a 73 y.o. male with a hx of sigmoid diverticulosis, chronic diarrhea, and intellectual slowing who is being seen today for the evaluation of CHF at the request of Dr. Tressia Holland.  History of Present Illness:   William Holland has no previously known cardiac history.  Patient has an ~ 12 month history of watery diarrhea with anywhere from 4-10 stools daily. Over the past 2 weeks he has been more fatigued and weaker than usual. In this setting, he has had 4 mechanical falls, most recently on 01/04/2019. He has also noted increase in lower extremity swelling and abdominal swelling. He has noted some intermittent chest discomfort, though was unable to further describe this. He denies any orthopnea, PND, or early satiety. He is uncertain what his weight trend has been at home.   Upon the patient's arrival to [facility] they were found to have stable BP and heart rate, temp afebrile, oxygen saturation 97 % on room air, weight 72.6 kg. EKG showed sinus rhythm with nonspecific changes as outlined below, CXR showed cardiomegaly with mild bilateral interstitial edema and prominent colonic distention consistent with colonic ileus or colonic obstruction.  CT of the abdomen/pelvis showed free air in the abdomen consistent with bowel perforation that appeared to be in the mid sigmoid region, changes consistent with chronic cystitis, extensive anasarca, and new small right pleural effusion.  CT head was nonacute.  Bilateral lower extremity venous ultrasound negative for DVT.  Labs showed troponin 0.162, BNP 2927, albumin 2.9, AST 333, ALT 120, T bili 1.4, potassium less than 2, sodium 147, serum creatinine 1.3, calcium 7.4,, WBC 7.8, hemoglobin 11, platelet count  201 C. difficile negative, GI panel negative, lactic acid 1.  In the ED, he was started on empiric Zosyn, given p.o. KCl and started on IV potassium repletion.    Past Medical History:  Diagnosis Date  . Diverticulosis, sigmoid 02/27/2018   Colonoscopy done on 02/27/2018 by Dr. Marius Holland  . Stuttering during school years     Past Surgical History:  Procedure Laterality Date  . APPENDECTOMY    . COLONOSCOPY WITH PROPOFOL N/A 02/27/2018   Procedure: COLONOSCOPY WITH PROPOFOL;  Surgeon: Lin Landsman, MD;  Location: [facility] ENDOSCOPY;  Service: Gastroenterology;  Laterality: N/A;  . EYE SURGERY       Home Meds: Prior to Admission medications   Medication Sig Start Date End Date Taking? Authorizing Provider  tamsulosin (FLOMAX) 0.4 MG CAPS capsule  01/30/18   [provider]    Inpatient Medications: Scheduled Meds:  Continuous Infusions: . 0.9 % NaCl with KCl 40 mEq / L    . piperacillin-tazobactam (ZOSYN)  IV    . potassium chloride 10 mEq (01/05/19 1424)   PRN Meds:   Allergies:  No Known Allergies  Social History:   Social History   Socioeconomic History  . Marital status: Single    Spouse name: Not on file  . Number of children: Not on file  . Years of education: Not on file  . Highest education level: Not on file  Occupational History  . Occupation: retired  Scientific laboratory technician  . Financial resource strain: Not on file  . Food insecurity:    Worry: Not on file  Inability: Not on file  . Transportation needs:    Medical: Not on file    Non-medical: Not on file  Tobacco Use  . Smoking status: Never Smoker  . Smokeless tobacco: Never Used  Substance and Sexual Activity  . Alcohol use: No    Alcohol/week: 0.0 standard drinks  . Drug use: No  . Sexual activity: Not on file    Comment: Not Asked  Lifestyle  . Physical activity:    Days per week: Not on file    Minutes per session: Not on file  . Stress: Not on file  Relationships  . Social connections:     Talks on phone: Not on file    Gets together: Not on file    Attends religious service: Not on file    Active member of club or organization: Not on file    Attends meetings of clubs or organizations: Not on file    Relationship status: Not on file  . Intimate partner violence:    Fear of current or ex partner: Not on file    Emotionally abused: Not on file    Physically abused: Not on file    Forced sexual activity: Not on file  Other Topics Concern  . Not on file  Social History Narrative   Lives at home with his nephew.  Unsteady gait.     Family History:   Family History  Problem Relation Age of Onset  . Cancer Brother   . Cancer Father   . Uterine cancer Mother     ROS:  Review of Systems  Constitutional: Positive for malaise/fatigue. Negative for chills, diaphoresis, fever and weight loss.  HENT: Negative for congestion.   Eyes: Negative for discharge and redness.  Respiratory: Negative for cough, hemoptysis, sputum production, shortness of breath and wheezing.   Cardiovascular: Positive for chest pain and leg swelling. Negative for palpitations, orthopnea, claudication and PND.  Gastrointestinal: Positive for abdominal pain, diarrhea and nausea. Negative for blood in stool, constipation, heartburn, melena and vomiting.  Genitourinary: Negative for hematuria.  Musculoskeletal: Negative for falls and myalgias.  Skin: Negative for rash.  Neurological: Positive for weakness. Negative for dizziness, tingling, tremors, sensory change, speech change, focal weakness and loss of consciousness.  Endo/Heme/Allergies: Does not bruise/bleed easily.  Psychiatric/Behavioral: Negative for substance abuse. The patient is not nervous/anxious.   All other systems reviewed and are negative.     Physical Exam/Data:   Vitals:   01/05/19 1029 01/05/19 1030 01/05/19 1220  BP: (!) 108/53  123/64  Pulse: 70  77  Resp: 18  20  Temp: 97.9 F (36.6 C)    TempSrc: Oral    SpO2: 97%   95%  Weight:  72.6 kg   Height:  5' 9"  (1.753 m)     Intake/Output Summary (Last 24 hours) at 01/05/2019 1511 Last data filed at 01/05/2019 1417 Gross per 24 hour  Intake 50 ml  Output -  Net 50 ml   Filed Weights   01/05/19 1030  Weight: 72.6 kg   Body mass index is 23.63 kg/m.   Physical Exam: General: Frail and ill appeaeing, in no acute distress. Laying fully supine in bed on room air without dyspnea.  Head: Normocephalic, atraumatic, sclera non-icteric, no xanthomas, nares without discharge.  Neck: Negative for carotid bruits. JVD not elevated. Lungs: Diminished breath sounds bilaterally with crackles along the bases R>L on anterior exam secondary to fatigue. Breathing is unlabored. Heart: RRR with S1 S2. No murmurs, rubs,  or gallops appreciated. Abdomen: Firm, non-tender, distended with normoactive bowel sounds. No rebound/guarding. No obvious abdominal masses. Msk:  Strength and tone appear normal for age. Extremities: No clubbing or cyanosis. 3+ pitting edema to the sacrum. Distal pedal pulses are 1+ and equal bilaterally. Neuro: Alert and oriented X 3. No facial asymmetry. No focal deficit. Moves all extremities spontaneously. Psych:  Responds to questions appropriately with a normal affect.   EKG:  The EKG was personally reviewed and demonstrates: NSR, 75 bpm, right axis deviation, inferior Q waves, nonspecific anterolateral st/t changes  Telemetry:  Telemetry was personally reviewed and demonstrates: NSR  Weights: Filed Weights   01/05/19 1030  Weight: 72.6 kg    Relevant CV Studies: none  Laboratory Data:  Chemistry Recent Labs  Lab 01/05/19 1037  NA 147*  K <2.0*  CL 102  CO2 36*  GLUCOSE 93  BUN 20  CREATININE 1.30*  CALCIUM 7.4*  GFRNONAA 55*  GFRAA >60  ANIONGAP 9    Recent Labs  Lab 01/05/19 1320  PROT 5.8*  ALBUMIN 2.9*  AST 333*  ALT 120*  ALKPHOS 74  BILITOT 1.4*   Hematology Recent Labs  Lab 01/05/19 1037  WBC 7.8  RBC  4.17*  HGB 11.0*  HCT 35.1*  MCV 84.2  MCH 26.4  MCHC 31.3  RDW 17.2*  PLT 201   Cardiac Enzymes Recent Labs  Lab 01/05/19 1037 01/05/19 1320  TROPONINI 0.16* 0.16*   No results for input(s): TROPIPOC in the last 168 hours.  BNP Recent Labs  Lab 01/05/19 1208  BNP 2,927.0*    DDimer No results for input(s): DDIMER in the last 168 hours.  Radiology/Studies:  Dg Chest 2 View  Result Date: 01/05/2019 IMPRESSION: 1. Cardiomegaly with bilateral pulmonary interstitial prominence consistent with congestive heart failure and mild bilateral interstitial edema. 2. Prominent colonic distention consistent with colonic ileus or colonic obstruction. Abdominal series suggested for further evaluation. Electronically Signed   By: Marcello Moores  Register   On: 01/05/2019 11:01   Ct Head Wo Contrast  Result Date: 01/05/2019 IMPRESSION: Unremarkable CT appearance of the brain for age. Electronically Signed   By: Logan Bores M.D.   On: 01/05/2019 13:48   Ct Abdomen Pelvis W Contrast  Result Date: 01/05/2019 IMPRESSION: 1. Free air in the abdomen consistent with bowel perforation. The site of perforation appears to be in the mid sigmoid region at the transition point between gaseous distention and bowel wall thickening of the colon. The site of transition could represent inflammation or tumor in the colon. 2. Air in the bladder and left renal collecting system with numerous stones in the bladder. 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 The patient had stones and air in the bladder on the prior study of 2017. 3. New small right pleural effusion. 4. New extensive anasarca. Critical Value/emergent results were called by telephone at the time of interpretation on 01/05/2019 at 1:21 pm to Dr. Corinna Capra , who verbally acknowledged these results. Electronically Signed   By: Lorriane Shire M.D.   On: 01/05/2019 13:26   US Venous Img Lower Bilateral  Result Date: 01/05/2019 IMPRESSION: No evidence of deep  venous thrombosis in the lower extremities. Small complex fluid collection in the left popliteal fossa. Findings are compatible with a Baker's cyst. Electronically Signed   By: Markus Daft M.D.   On: 01/05/2019 14:27    Assessment and Plan:   1.  Acute CHF, type unknown: -No orthopnea  -Echo pending -Agree with IV Lasix  40 mg q 12 hours with KCl repletion  -If patient is found to have a new cardiomyopathy, he will need an ischemic evaluation once his acute abdominal illness is treated and his volume status is optimized  -Check TSH -As he compensates, start evidence based therapy as indicated  -Daily weights -Strict I/O  2.  Preprocedure cardiac risk stratification: -Obtain echo for further risk stratification -Would recommend correction of electrolyte abnormalities to goal as outlined as well as optimization of his volume status prior to proceeding with anesthesia induction/OR -Modified Lee Criteria indicate he is a high risk for noncardiac surgery; however, given his acute illness, if surgery is needed, this should not delay his treatment -We can optimize some of his risk factors this evening in preparation for surgery to include correction of electrolytes and diuresis  -Given his acute illness and need for surgery, I doubt we will be able to further reduce his cardiac risk prior to surgery outside of the above  3.  Elevated troponin: -Has indicted an intermittent chest pain, though is unable to provide further details -Likely in the setting of volume overload and AKI -Echo pending (spoke with Sonia Side and the patient's echo will be performed either today or first thing in the morning on 01/06/2019) -Mildly elevated and flat trending at this time, hold off on starting heparin gtt unless there is dynamic troponin elevated (level > 1)  4.  Bowel perforation: -Surgery has been consulted, with plans to proceed to the OR when possible after discussion with IM -Blood cultures pending, follow -He  does not appear septic at this time -On empiric Zosyn  5.  Transaminitis/hypoalbuminemia: -Patient with history of alcohol abuse, though indicates he has not drank etoh in years. LFT was normal 10 months prior -Cannot exclude congestive hepatopathy  -Likely contributing to the patient's lower extremity swelling and anasarca through third spacing with possible cirrhosis  -Liver ultrasound pending per internal medicine  6.  Hypokalemia: -Replete to goal of 4.0  8.  Anemia: -Hemoglobin relatively stable when compared to previous readings -Monitor  9. AKI: -Appears to be in the setting of congestion with possible low output heart failure (echo pending as above) -If his renal function continues to worsen with diuresis and if his EF is found to be reduced, he may need inotropic support to augment diuresis    For questions or updates, please contact [facility] Please consult [email or web address] for contact info under Cardiology/STEMI.   Signed, Christell Faith, PA-C [facility] Pager: [phone] 01/05/2019, 3:11 PM

## 2019-01-05 NOTE — ED Provider Notes (Signed)
[facility] Emergency Department Provider Note  ____________________________________________  Time seen: Approximately 12:09 PM  I have reviewed the triage vital signs and the nursing notes.   HISTORY  Chief Complaint Leg Swelling and Weakness    HPI William Holland is a 73 y.o. male with a history of chronic diarrhea who does not regularly see a physician presenting with recurrent falls over the past 2 weeks.  The patient is accompanied by his brother and sister-in-law, and 3 of them report that he has had multiple falls.  The most common reason he falls is that he bends over forward, loses his balance, and tips over.  He does not have any associated lightheadedness, syncope or loss of consciousness.  He has gotten some bruises for his falls but has not had any acute injury including neck pain or back pain, or severe extremity pain.  The patient has also noted that he has new right greater than left lower extremity edema.  Occasionally when he bends over he gets a central sharp chest pain that resolves immediately if he stands up.  He has been having over a year of chronic diarrhea with greater than 5 daily episodes of loose nonbloody stool without any associated abdominal pain.  He has not travel outside the [place] or camping.  He has never seen a GI physician, had stool studies.  Reviewed the patient's chart, and he did have an admission 3/17 for acute diverticulitis.  Past Medical History:  Diagnosis Date  . Diverticulosis, sigmoid 02/27/2018   Colonoscopy done on 02/27/2018 by Dr. Marius Ditch    Patient Active Problem List   Diagnosis Date Noted  . Chronic diarrhea of unknown origin   . Loss of weight   . Abdominal pain 03/10/2016  . Colitis 03/10/2016    Past Surgical History:  Procedure Laterality Date  . APPENDECTOMY    . COLONOSCOPY WITH PROPOFOL N/A 02/27/2018   Procedure: COLONOSCOPY WITH PROPOFOL;  Surgeon: Lin Landsman, MD;  Location: [facility]  ENDOSCOPY;  Service: Gastroenterology;  Laterality: N/A;  . EYE SURGERY      Current Outpatient Rx  . Order #: [id number] Class: Normal  . Order #: [id number] Class: Historical Med  . Order #: [id number] Class: Historical Med    Allergies Patient has no known allergies.  Family History  Problem Relation Age of Onset  . Cancer Brother   . Cancer Father     Social History Social History   Tobacco Use  . Smoking status: Never Smoker  . Smokeless tobacco: Never Used  Substance Use Topics  . Alcohol use: No    Alcohol/week: 0.0 standard drinks  . Drug use: No    Review of Systems Constitutional: No fever/chills.  Recurrent falls.  No lightheadedness or syncope.  No loss of consciousness. Eyes: No visual changes.  No blurred or double vision. ENT: No sore throat. No congestion or rhinorrhea. Cardiovascular: Positive chest pain. Denies palpitations. Respiratory: Denies shortness of breath.  No cough. Gastrointestinal: No abdominal pain.  No nausea, no vomiting.  Positive diarrhea.  No constipation. Genitourinary: Negative for dysuria.  No urinary frequency. Musculoskeletal: Negative for back pain.  No neck pain.  No severe extremity pain.  New significant right greater than left bilateral lower extremity edema Skin: Negative for rash.  Positive for ecchymoses. Neurological: Negative for headaches. No focal numbness, tingling or weakness.     ____________________________________________   PHYSICAL EXAM:  VITAL SIGNS: ED Triage Vitals  Enc Vitals Group     BP 01/05/19  1029 (!) 108/53     Pulse Rate 01/05/19 1029 70     Resp 01/05/19 1029 18     Temp 01/05/19 1029 97.9 F (36.6 C)     Temp Source 01/05/19 1029 Oral     SpO2 01/05/19 1029 97 %     Weight 01/05/19 1030 160 lb (72.6 kg)     Height 01/05/19 1030 5' 9"  (1.753 m)     Head Circumference --      Peak Flow --      Pain Score 01/05/19 1029 3     Pain Loc --      Pain Edu? --      Excl. in [place]? --      Constitutional: Patient is alert and answering questions appropriately.  GCS is 15.  He is chronically ill-appearing.. Eyes: Conjunctivae are normal.  EOMI. PERRLA.  No raccoon eyes.  No scleral icterus. Head: Atraumatic.  No battle sign. Nose: No congestion/rhinnorhea.  No swelling over the nose or septal hematoma. Mouth/Throat: Mucous membranes are dry.  No dental injury or malocclusion..  Neck: No stridor.  Supple.  No JVD.  No midline C-spine tenderness to palpation, step-offs or deformities. Cardiovascular: Normal rate, regular rhythm. No murmurs, rubs or gallops.  Respiratory: Normal respiratory effort.  No accessory muscle use or retractions. Lungs CTAB.  No wheezes, rales or ronchi. Gastrointestinal: Soft, nontender and mildly distended.  The patient has an edematous abdomen.  No guarding or rebound.  No peritoneal signs. Musculoskeletal: Positive significant pitting lower extremity edema to above the knees, right greater than left bilaterally.. No ttp in the calves or palpable cords.  Negative Homan's sign. Neurologic: Alert with clear speech.    Face and smile are symmetric.  EOMI.  Moves all extremities well. Skin:  Skin is warm, dry and intact. No rash noted.  The patient has some minimal old bruising on the right arm.  Positive pallor. Psychiatric: Mood and affect are normal.  ____________________________________________   LABS (all labs ordered are listed, but only abnormal results are displayed)  Labs Reviewed  BASIC METABOLIC PANEL - Abnormal; Notable for the following components:      Result Value   Sodium 147 (*)    Potassium <2.0 (*)    CO2 36 (*)    Creatinine, Ser 1.30 (*)    Calcium 7.4 (*)    GFR calc non Af Amer 55 (*)    All other components within normal limits  CBC - Abnormal; Notable for the following components:   RBC 4.17 (*)    Hemoglobin 11.0 (*)    HCT 35.1 (*)    RDW 17.2 (*)    All other components within normal limits  TROPONIN I - Abnormal;  Notable for the following components:   Troponin I 0.16 (*)    All other components within normal limits  BRAIN NATRIURETIC PEPTIDE - Abnormal; Notable for the following components:   B Natriuretic Peptide 2,927.0 (*)    All other components within normal limits  C DIFFICILE QUICK SCREEN W PCR REFLEX  GASTROINTESTINAL PANEL BY PCR, STOOL (REPLACES STOOL CULTURE)  CULTURE, BLOOD (ROUTINE X 2)  CULTURE, BLOOD (ROUTINE X 2)  HEPATIC FUNCTION PANEL  TROPONIN I   ____________________________________________  EKG  ED ECG REPORT I, Anne-Caroline Mariea Clonts, the attending physician, personally viewed and interpreted this ECG.   Date: 01/05/2019  EKG Time: 1039  Rate: 75  Rhythm: normal sinus rhythm  Axis: normal  Intervals:none  ST&T Change: No STEMI  ____________________________________________  RADIOLOGY  Dg Chest 2 View  Result Date: 01/05/2019 CLINICAL DATA:  Falling. Increased weakness. Lower extremity swelling. EXAM: CHEST - 2 VIEW COMPARISON:  CT abdomen 03/10/2016. FINDINGS: Cardiomegaly with mild bilateral from interstitial prominence consistent with mild interstitial edema. No pleural effusion or pneumothorax. Prominent colonic distention consistent with colonic ileus or colonic obstruction. Abdominal series suggested for further evaluation. IMPRESSION: 1. Cardiomegaly with bilateral pulmonary interstitial prominence consistent with congestive heart failure and mild bilateral interstitial edema. 2. Prominent colonic distention consistent with colonic ileus or colonic obstruction. Abdominal series suggested for further evaluation. Electronically Signed   By: Marcello Moores  Register   On: 01/05/2019 11:01   Ct Head Wo Contrast  Result Date: 01/05/2019 CLINICAL DATA:  Multiple falls in the past 2 weeks due to increased weakness. Bilateral lower extremity swelling. EXAM: CT HEAD WITHOUT CONTRAST TECHNIQUE: Contiguous axial images were obtained from the base of the skull through the vertex  without intravenous contrast. COMPARISON:  None. FINDINGS: Brain: There is no evidence of acute infarct, intracranial hemorrhage, mass, midline shift, or extra-axial fluid collection. Mild cerebral atrophy is within normal limits for age. Vascular: Calcified atherosclerosis at the skull base. No hyperdense vessel. Skull: No fracture or focal osseous lesion. Sinuses/Orbits: Opacification of a posterior left ethmoid air cell. Minimal focal mucosal thickening or tiny mucous retention cyst in the right sphenoid sinus. Clear mastoid air cells. Left cataract extraction. Left scleral buckle. Other: Partially visualized 10 mm subcutaneous nodule in the posterior upper neck just left of midline, possibly a sebaceous cyst but incompletely evaluated. IMPRESSION: Unremarkable CT appearance of the brain for age. Electronically Signed   By: Logan Bores M.D.   On: 01/05/2019 13:48   Ct Abdomen Pelvis W Contrast  Result Date: 01/05/2019 CLINICAL DATA:  Abdominal bloating.  Abdominal infection. EXAM: CT ABDOMEN AND PELVIS WITH CONTRAST TECHNIQUE: Multidetector CT imaging of the abdomen and pelvis was performed using the standard protocol following bolus administration of intravenous contrast. CONTRAST:  163m OMNIPAQUE IOHEXOL 300 MG/ML  SOLN COMPARISON:  Radiographs dated 07/18/2018 and CT scan dated 03/10/2016 FINDINGS: Lower chest: New small right pleural effusion. Heart size is normal. Tiny hiatal hernia. Hepatobiliary: No focal liver abnormality is seen. No gallstones, gallbladder wall thickening, or biliary dilatation. Pancreas: Unremarkable. No pancreatic ductal dilatation or surrounding inflammatory changes. Spleen: Normal in size without focal abnormality. Adrenals/Urinary Tract: There is a single bubble of air in a calyx of the mid left kidney. 9 mm cyst in the mid right kidney. Exophytic 3.2 cm cyst in the mid left kidney. There are numerous large stones in the bladder, increased since 2017. There is air in the  bladder, which was also demonstrated on the prior exam of 2017. Stomach/Bowel: There are multiple small bubbles of free air scattered throughout the abdomen. There appears to be a site of perforation of the sigmoid portion of the colon at the site of transition from gaseous distention to narrowing and thickening of the mucosa of the sigmoid. This is best seen on images 76 and 77 of series 7 and image 65 of series 2. There is marked gaseous distention of the colon from the cecum to the proximal sigmoid region. There are multiple diverticula in the sigmoid region just distal to the transition. Vascular/Lymphatic: Slight aortic atherosclerosis. No enlarged abdominal or pelvic lymph nodes. Reproductive: Prostate is unremarkable. Other: Diffuse prominent anasarca. There is only a tiny amount of ascites in the abdomen. Musculoskeletal: No acute or significant osseous findings. IMPRESSION: 1. Free air  in the abdomen consistent with bowel perforation. The site of perforation appears to be in the mid sigmoid region at the transition point between gaseous distention and bowel wall thickening of the colon. The site of transition could represent inflammation or tumor in the colon. 2. Air in the bladder and left renal collecting system with numerous stones in the bladder. 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 The patient had stones and air in the bladder on the prior study of 2017. 3. New small right pleural effusion. 4. New extensive anasarca. Critical Value/emergent results were called by telephone at the time of interpretation on 01/05/2019 at 1:21 pm to Dr. Corinna Capra , who verbally acknowledged these results. Electronically Signed   By: Lorriane Shire M.D.   On: 01/05/2019 13:26    ____________________________________________   PROCEDURES  Procedure(s) performed: None  Procedures  Critical Care performed: Yes, see critical care note(s) ____________________________________________   INITIAL IMPRESSION /  ASSESSMENT AND PLAN / ED COURSE  Pertinent labs & imaging results that were available during my care of the patient were reviewed by me and considered in my medical decision making (see chart for details).  73 y.o. male who does not see a doctor on a regular basis he has been having more than 1 year of 5 daily episodes of loose stool, now with recurrent falls over the past 2 weeks.  Overall, this patient has multiple organ systems that are concerning.  For his falls, I do not see any evidence of acute injury.  We will get a CT scan of the head.  Also get a CT of the abdomen with stool studies to see if the patient has ongoing diverticulitis or another acute intra-abdominal pathology.  Patient's potassium today is less than 2, which is likely due to his chronic diarrhea.  I ordered oral as well as IV supplementation.  Not see any concerning changes on his EKG, and I assume this is likely chronic.  The patient does not have ischemic changes on his EKG, but he does have a troponin today of 0.16.  I will trend this.  Once I get the patient CBC back, if his blood counts and platelets are stable, I will treat him with aspirin.  The patient has not been having any chest pain, but given his new lower extremity edema, I am concerned about new onset CHF.  I have also ordered bilateral lower extremity ultrasounds to rule out DVT; he is not hypoxic, tachycardic, and has had no chest pain so PE is very unlikely but certainly PE could cause right heart strain resulting in an elevation in troponin.  The patient will be admitted to the hospital.  ----------------------------------------- 1:46 PM on 01/05/2019 -----------------------------------------  The radiologist called the emergency department with a critical finding of bowel perforation, with likely perforation in the sigmoid colon.  A consultation with general surgery as well as intravenous antibiotics were initiated immediately.  The patient continues to be  hemodynamically stable.  I will not treat the patient with aspirin, as he will likely need to undergo surgery for his perforation.  Have spoken to the patient, as well as his family members about both the findings of the bowel perforation and new onset CHF.  They have had an opportunity to ask their questions.  ----------------------------------------- 1:54 PM on 01/05/2019 -----------------------------------------  I have spoken with Dr. Celine Ahr, the general surgeon on-call, who will evaluate the patient.  He is not stable for surgery today, and is unlikely go to the operating  room given his multiple other medical problems.  CRITICAL CARE Performed by: Eula Listen   Total critical care time: 45 minutes  Critical care time was exclusive of separately billable procedures and treating other patients.  Critical care was necessary to treat or prevent imminent or life-threatening deterioration.  Critical care was time spent personally by me on the following activities: development of treatment plan with patient and/or surrogate as well as nursing, discussions with consultants, evaluation of patient's response to treatment, examination of patient, obtaining history from patient or surrogate, ordering and performing treatments and interventions, ordering and review of laboratory studies, ordering and review of radiographic studies, pulse oximetry and re-evaluation of patient's condition.   ____________________________________________  FINAL CLINICAL IMPRESSION(S) / ED DIAGNOSES  Final diagnoses:  Elevated troponin  New onset of congestive heart failure (HCC)  Hypokalemia  Bowel perforation (HCC)         NEW MEDICATIONS STARTED DURING THIS VISIT:  New Prescriptions   No medications on file      Eula Listen, MD 01/05/19 1355

## 2019-01-05 NOTE — ED Notes (Signed)
First Nurse Note: Recent hx of falls X 4 over past 2 weeks and has swelling and "weeping" both legs "from his thighs down" per sister-in-law.

## 2019-01-05 NOTE — ED Triage Notes (Signed)
Pt is here with his brother, states in the past week and half the pt has fallen 4 times due to increased weakness, and they noticed his BL LE are swollen and weeping. Denies hx of CHF. States he has been having issues with diarrhea and was recently dx with crohn's or collitis.

## 2019-01-05 NOTE — Progress Notes (Signed)
Received critical from lab for trop and lab. Both values unchanged. K is still running with a repeat lab at 2000. MD aware of elevated trop. MD not informed of both results d/t trend.

## 2019-01-05 NOTE — ED Notes (Signed)
Waiting on K+ from pharmacy

## 2019-01-05 NOTE — Consult Note (Signed)
Pharmacy Antibiotic Note  William Holland is a 73 y.o. male admitted on 01/05/2019 with intra-abdominal infection.  Pharmacy has been consulted for Zosyn dosing.  Plan: Zosyn 3.375g IV q8h (4 hour infusion).  Height: 5' 9"  (175.3 cm) Weight: 160 lb (72.6 kg) IBW/kg (Calculated) : 70.7  Temp (24hrs), Avg:97.9 F (36.6 C), Min:97.9 F (36.6 C), Max:97.9 F (36.6 C)  Recent Labs  Lab 01/05/19 1037  WBC 7.8  CREATININE 1.30*    Estimated Creatinine Clearance: 52.1 mL/min (A) (by C-G formula based on SCr of 1.3 mg/dL (H)).    No Known Allergies  Antimicrobials this admission: Zosyn 1/13 >>   Dose adjustments this admission:   Microbiology results: 1/13 BCx: pending  UCx:    Sputum:    MRSA PCR:   Thank you for allowing pharmacy to be a part of this patient's care.  Forrest Moron, PharmD Clinical Pharmacist 01/05/2019 2:45 PM

## 2019-01-05 NOTE — Consult Note (Addendum)
[facility] SURGICAL CONSULTATION NOTE (initial) - cpt: 99244 (Outpatient/ED)   HISTORY OF PRESENT ILLNESS (HPI):  73 y.o. male presented to [facility] ED today for evaluation of multiple falls and loose stools. Patient reports that he has been having multiple falls over the last two weeks. His family reports that these are often a result of him losing his balance. He does not report any apparent injuries directly related to these falls. He also endorse a history of >5 loose stools a day for greater than 1 year. He has not tried anything to treat this. He denied any recent fevers, chills, cough, SOB, nausea, emesis, abdominal pain, or urinary changes. He does endorse central chest discomfort and edematous lower extremities. Family at bedside helps to contribute to the history. Work up in the ED was alarming for pneumoperitoneum and colonic dilation. Laboratory work up revealed a troponin of 0.16, hypokalemia of <2.0, sCr of 1.30, BNP is nearly 3,000.   Of note, prior colonoscopy by Dr. Marius Ditch showed a sigmoid stricture in the setting of extensive diverticulosis, presumably related to untreated episodes of diverticulitis.  Surgery is consulted by emergency medicine  physician Dr. Mariea Clonts, MD in this context for evaluation and management of pneumoperitoneum.  PAST MEDICAL HISTORY (PMH):  Past Medical History:  Diagnosis Date  . Diverticulosis, sigmoid 02/27/2018   Colonoscopy done on 02/27/2018 by Dr. Marius Ditch  . Stuttering during school years      PAST SURGICAL HISTORY [facility]):  Past Surgical History:  Procedure Laterality Date  . APPENDECTOMY    . COLONOSCOPY WITH PROPOFOL N/A 02/27/2018   Procedure: COLONOSCOPY WITH PROPOFOL;  Surgeon: Lin Landsman, MD;  Location: [facility] ENDOSCOPY;  Service: Gastroenterology;  Laterality: N/A;  . EYE SURGERY       MEDICATIONS:  Prior to Admission medications   Medication Sig Start Date End Date Taking? Authorizing Provider  furosemide (LASIX) 20  MG tablet Take 1 tablet (20 mg total) by mouth daily for 14 days. 07/18/18 08/01/18  Lin Landsman, MD  potassium chloride SA (K-DUR,KLOR-CON) 20 MEQ tablet TK 2 TS PO ONCE DAILY FOR 3 DAYS 07/18/18   [provider]  tamsulosin (FLOMAX) 0.4 MG CAPS capsule  01/30/18   [provider]     ALLERGIES:  No Known Allergies   SOCIAL HISTORY:  Social History   Socioeconomic History  . Marital status: Single    Spouse name: Not on file  . Number of children: Not on file  . Years of education: Not on file  . Highest education level: Not on file  Occupational History  . Occupation: retired  Scientific laboratory technician  . Financial resource strain: Not on file  . Food insecurity:    Worry: Not on file    Inability: Not on file  . Transportation needs:    Medical: Not on file    Non-medical: Not on file  Tobacco Use  . Smoking status: Never Smoker  . Smokeless tobacco: Never Used  Substance and Sexual Activity  . Alcohol use: No    Alcohol/week: 0.0 standard drinks  . Drug use: No  . Sexual activity: Not on file    Comment: Not Asked  Lifestyle  . Physical activity:    Days per week: Not on file    Minutes per session: Not on file  . Stress: Not on file  Relationships  . Social connections:    Talks on phone: Not on file    Gets together: Not on file  Attends religious service: Not on file    Active member of club or organization: Not on file    Attends meetings of clubs or organizations: Not on file    Relationship status: Not on file  . Intimate partner violence:    Fear of current or ex partner: Not on file    Emotionally abused: Not on file    Physically abused: Not on file    Forced sexual activity: Not on file  Other Topics Concern  . Not on file  Social History Narrative   Lives at home with his nephew.  Unsteady gait.     FAMILY HISTORY:  Family History  Problem Relation Age of Onset  . Cancer Brother   . Cancer Father   . Uterine cancer Mother        REVIEW OF SYSTEMS:  Review of Systems  Constitutional: Negative for chills and fever.  Respiratory: Negative for cough and sputum production.   Cardiovascular: Positive for chest pain and leg swelling. Negative for palpitations.  Gastrointestinal: Positive for diarrhea. Negative for abdominal pain, blood in stool, constipation, nausea and vomiting.  Genitourinary: Negative for dysuria and hematuria.  Musculoskeletal: Positive for joint pain. Negative for myalgias.  Neurological: Negative for dizziness and headaches.  All other systems reviewed and are negative.   VITAL SIGNS:  Temp:  [97.9 F (36.6 C)] 97.9 F (36.6 C) (01/13 1029) Pulse Rate:  [70-77] 77 (01/13 1220) Resp:  [18-20] 20 (01/13 1220) BP: (108-123)/(53-64) 123/64 (01/13 1220) SpO2:  [95 %-97 %] 95 % (01/13 1220) Weight:  [72.6 kg] 72.6 kg (01/13 1030)     Height: 5' 9"  (175.3 cm) Weight: 72.6 kg BMI (Calculated): 23.62   INTAKE/OUTPUT:  This shift: Total I/O In: 50 [IV Piggyback:50] Out: -   Last 2 shifts: @IOLAST2SHIFTS @   PHYSICAL EXAM:  Physical Exam Constitutional:      General: He is not in acute distress.    Appearance: Normal appearance. He is obese. He is not ill-appearing.  HENT:     Head: Normocephalic and atraumatic.  Eyes:     General: No scleral icterus.    Conjunctiva/sclera: Conjunctivae normal.  Cardiovascular:     Rate and Rhythm: Normal rate and regular rhythm.     Pulses: Normal pulses.     Heart sounds: No murmur. No friction rub. No gallop.   Pulmonary:     Effort: Pulmonary effort is normal.     Breath sounds: Normal breath sounds.  Abdominal:     General: Abdomen is protuberant. There is distension.     Palpations: There is no shifting dullness.     Tenderness: There is no abdominal tenderness. There is no guarding or rebound.     Comments: Abdomen is mildly distended and firm. No guarding or rebound tenderness. Tympanic to percussion Healed laparoscopic incisions   Genitourinary:    Comments: Deferred Musculoskeletal:     Right lower leg: Edema present.     Left lower leg: Edema present.     Comments: Bilateral +2 pitting edema extending above the knee bilaterally  Skin:    General: Skin is warm and dry.     Coloration: Skin is not jaundiced or pale.  Neurological:     Mental Status: He is alert. Mental status is at baseline.       Labs:  CBC Latest Ref Rng & Units 01/05/2019 07/18/2018 02/25/2018  WBC 4.0 - 10.5 K/uL 7.8 7.2 5.8  Hemoglobin 13.0 - 17.0 g/dL 11.0(L) 12.2(L) 10.7(L)  Hematocrit 39.0 - 52.0 % 35.1(L) 36.8(L) 33.1(L)  Platelets 150 - 400 K/uL 201 218 241   CMP Latest Ref Rng & Units 01/05/2019 07/18/2018 02/25/2018  Glucose 70 - 99 mg/dL 93 110(H) 94  BUN 8 - 23 mg/dL 20 24(H) 18  Creatinine 0.61 - 1.24 mg/dL 1.30(H) 1.33(H) 1.09  Sodium 135 - 145 mmol/L 147(H) 147(H) 144  Potassium 3.5 - 5.1 mmol/L <2.0(LL) 2.9(L) 3.6  Chloride 98 - 111 mmol/L 102 105 107  CO2 22 - 32 mmol/L 36(H) 32 27  Calcium 8.9 - 10.3 mg/dL 7.4(L) 8.6(L) 8.9  Total Protein 6.5 - 8.1 g/dL 5.8(L) - 7.6  Total Bilirubin 0.3 - 1.2 mg/dL 1.4(H) - 1.2  Alkaline Phos 38 - 126 U/L 74 - 63  AST 15 - 41 U/L 333(H) - 23  ALT 0 - 44 U/L 120(H) - 15(L)     Imaging studies:   -- CXR on 01/05/19 personally reviewed and radiologist report reviewed:  IMPRESSION: 1. Cardiomegaly with bilateral pulmonary interstitial prominence consistent with congestive heart failure and mild bilateral interstitial edema. 2. Prominent colonic distention consistent with colonic ileus or colonic obstruction. Abdominal series suggested for further Evaluation.  -- CT Abdomen/Pelvis on 01/05/19 personally reviewed and radiologist report reviewed: IMPRESSION: 1. Free air in the abdomen consistent with bowel perforation. The site of perforation appears to be in the mid sigmoid region at the transition point between gaseous distention and bowel wall thickening of the colon. The site of  transition could represent inflammation or tumor in the colon. 2. Air in the bladder and left renal collecting system with numerous stones in the bladder. 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 The patient had stones and air in the bladder on the prior study of 2017. 3. New small right pleural effusion. 4. New extensive anasarca.    Assessment/Plan: (ICD-10's: K44.1) 73 y.o. male with a multitude of medical issues currently including severe hypokalemia, mild troponin elevation, creatinine elevation, and new onset CHF with colonic distension and pneumoperitoneum on radiographic imaging today, which is further complicated by pertinent comorbidities including chronic diarrhea and lack of previous medical care.   - Admit to Hospitalist given comorbid conditions  - NPO  - IV Abx (Zosyn), Conservative IVF management given CHF  - Pain control as needed  - Continue to monitor abdominal examination and vital signs  - Patient will require exploratory laparotomy given pneumoperitoneum, however, given his significant comorbid conditions currently will require optimization prior to proceeding. Patient and his family at bedside aware and understanding. Timing of surgery per Dr Celine Ahr.    - Medical management per primary team.    All of the above findings and recommendations were discussed with the patient and his family, and all of patient's and his family's questions were answered to their expressed satisfaction.  Thank you for the opportunity to participate in this patient's care.   -- [name], PA-C [facility] 01/05/2019, 3:02 PM [phone] M-F: 7am - 4pm  I discussed this patient's care with the hospitalist, Dr. Tressia Miners.  We both agree that he needs some medical optimization prior to proceeding to the OR.  Cardiology consult, fluid and electrolyte management.  Mr. Scullion is not toxic, nor does he have peritoneal signs, suggesting that this may be a longer-standing  issue, rather than an acute perforation.  Nonetheless, surgery is indicated. I think the wiser course of action is to do what medical management can be performed this afternoon/overnight and we will plan to take him to the  OR in the morning.  He will likely need at least a partial, if not subtotal colectomy and an ostomy. He and his family understand that the ostomy will almost certainly be permanent. I saw and evaluated the patient.  I agree with the above documentation, exam, and plan, which I have edited where appropriate. [name]  4:33 PM

## 2019-01-06 ENCOUNTER — Inpatient Hospital Stay (HOSPITAL_COMMUNITY)
Admit: 2019-01-06 | Discharge: 2019-01-06 | Disposition: A | Discharge status: Home / Self Care | Payer: Medicare Other | Attending: Internal Medicine | Admitting: Internal Medicine

## 2019-01-06 ENCOUNTER — Inpatient Hospital Stay: Payer: Medicare Other

## 2019-01-06 DIAGNOSIS — I370 Nonrheumatic pulmonary valve stenosis: Secondary | ICD-10-CM

## 2019-01-06 DIAGNOSIS — I5021 Acute systolic (congestive) heart failure: Secondary | ICD-10-CM

## 2019-01-06 DIAGNOSIS — K631 Perforation of intestine (nontraumatic): Secondary | ICD-10-CM

## 2019-01-06 DIAGNOSIS — I361 Nonrheumatic tricuspid (valve) insufficiency: Secondary | ICD-10-CM

## 2019-01-06 DIAGNOSIS — E876 Hypokalemia: Secondary | ICD-10-CM

## 2019-01-06 LAB — ECHOCARDIOGRAM COMPLETE
Height: 69 in
Weight: 2852.8 oz

## 2019-01-06 LAB — CBC
HCT: 34.1 % — ABNORMAL LOW (ref 39.0–52.0)
Hemoglobin: 10.5 g/dL — ABNORMAL LOW (ref 13.0–17.0)
MCH: 25.4 pg — AB (ref 26.0–34.0)
MCHC: 30.8 g/dL (ref 30.0–36.0)
MCV: 82.6 fL (ref 80.0–100.0)
Platelets: 152 10*3/uL (ref 150–400)
RBC: 4.13 MIL/uL — ABNORMAL LOW (ref 4.22–5.81)
RDW: 17.1 % — ABNORMAL HIGH (ref 11.5–15.5)
WBC: 5.5 10*3/uL (ref 4.0–10.5)
nRBC: 0 % (ref 0.0–0.2)

## 2019-01-06 LAB — BASIC METABOLIC PANEL
Anion gap: 10 (ref 5–15)
BUN: 15 mg/dL (ref 8–23)
CO2: 39 mmol/L — ABNORMAL HIGH (ref 22–32)
Calcium: 6.8 mg/dL — ABNORMAL LOW (ref 8.9–10.3)
Chloride: 101 mmol/L (ref 98–111)
Creatinine, Ser: 1.11 mg/dL (ref 0.61–1.24)
GFR calc Af Amer: >60 mL/min (ref >60)
GLUCOSE: 72 mg/dL (ref 70–99)
Potassium: <2 mmol/L — CL (ref 3.5–5.1)
Sodium: 150 mmol/L — ABNORMAL HIGH (ref 135–145)

## 2019-01-06 LAB — MAGNESIUM
Magnesium: 1.9 mg/dL (ref 1.7–2.4)
Magnesium: 1.9 mg/dL (ref 1.7–2.4)

## 2019-01-06 LAB — POTASSIUM
POTASSIUM: 2.2 mmol/L — AB (ref 3.5–5.1)
Potassium: 2.8 mmol/L — ABNORMAL LOW (ref 3.5–5.1)
Potassium: 3 mmol/L — ABNORMAL LOW (ref 3.5–5.1)

## 2019-01-06 LAB — PHOSPHORUS: PHOSPHORUS: 3.2 mg/dL (ref 2.5–4.6)

## 2019-01-06 LAB — TROPONIN I: Troponin I: 0.12 ng/mL (ref <0.03)

## 2019-01-06 MED ORDER — POTASSIUM CHLORIDE CRYS ER 20 MEQ PO TBCR
40.0000 meq | EXTENDED_RELEASE_TABLET | ORAL | Status: AC
  Administered 2019-01-06 (×2): 40 meq via ORAL
  Filled 2019-01-06 (×2): qty 2

## 2019-01-06 MED ORDER — POTASSIUM CHLORIDE 10 MEQ/100ML IV SOLN
10.0000 meq | INTRAVENOUS | Status: AC
  Administered 2019-01-06 (×4): 10 meq via INTRAVENOUS
  Filled 2019-01-06 (×4): qty 100

## 2019-01-06 MED ORDER — DEXTROSE 5 % IV SOLN
INTRAVENOUS | Status: AC
  Administered 2019-01-06 – 2019-01-07 (×2): via INTRAVENOUS

## 2019-01-06 MED ORDER — POTASSIUM CHLORIDE 10 MEQ/100ML IV SOLN
10.0000 meq | INTRAVENOUS | Status: AC
  Administered 2019-01-06 – 2019-01-07 (×4): 10 meq via INTRAVENOUS
  Filled 2019-01-06 (×4): qty 100

## 2019-01-06 MED ORDER — POTASSIUM CHLORIDE CRYS ER 20 MEQ PO TBCR
40.0000 meq | EXTENDED_RELEASE_TABLET | ORAL | Status: AC
  Administered 2019-01-06 (×3): 40 meq via ORAL
  Filled 2019-01-06 (×3): qty 2

## 2019-01-06 NOTE — Progress Notes (Signed)
PT Cancellation Note  Patient Details Name: William Holland MRN: [id number] DOB: 1945/12/26   Cancelled Treatment:    Reason Eval/Treat Not Completed: Medical issues which prohibited therapy.  Has very low K+ below 2 and is trending elevated troponin.  Will check again at another time to see how pt is progressing.   Ramond Dial 01/06/2019, 9:12 AM  Mee Hives, PT MS Acute Rehab Dept. Number: [place] and [place]

## 2019-01-06 NOTE — Progress Notes (Signed)
Advanced Care Plan.  Purpose of Encounter: CODE STATUS. Parties in Attendance: The patient and me. Patient's Decisional Capacity: Yes. Medical Story: William Holland  is a 73 y.o. 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  He is admitted for acute systolic CHF with ejection fraction 30 to 35%, bowel perforation and severe hypokalemia.  I discussed with the patient about his current critical condition, prognosis and CODE STATUS.  He said he wants to be resuscitated and intubated if he has cardiopulmonary arrest. Plan:  Code Status: Full code. Time spent discussing advance care planning: 18 minutes.

## 2019-01-06 NOTE — Progress Notes (Signed)
*  PRELIMINARY RESULTS* Echocardiogram 2D Echocardiogram has been performed.  William Holland 01/06/2019, 8:08 AM

## 2019-01-06 NOTE — Progress Notes (Signed)
Progress Note  Patient Name: William Holland Date of Encounter: 01/06/2019  Primary Cardiologist: New -Arida  Subjective   Still having diarrhea.  No chest pain, shortness of breath, or palpitations.  No abdominal pain.  Inpatient Medications    Scheduled Meds: . enoxaparin (LOVENOX) injection  40 mg Subcutaneous Q24H  . tamsulosin  0.4 mg Oral Daily   Continuous Infusions: . piperacillin-tazobactam (ZOSYN)  IV 3.375 g (01/06/19 0502)   PRN Meds: acetaminophen **OR** acetaminophen, Influenza vac split quadrivalent PF, ondansetron **OR** ondansetron (ZOFRAN) IV, pneumococcal 23 valent vaccine   Vital Signs    Vitals:   01/06/19 0332 01/06/19 0405 01/06/19 0429 01/06/19 0758  BP:  (!) 89/42 101/60 (!) 107/49  Pulse:  70 71 72  Resp:  17    Temp:  97.7 F (36.5 C)  (!) 97.5 F (36.4 C)  TempSrc:  Oral  Oral  SpO2:  91% 93% 98%  Weight: 80.9 kg     Height:        Intake/Output Summary (Last 24 hours) at 01/06/2019 0926 Last data filed at 01/06/2019 0800 Gross per 24 hour  Intake 707.23 ml  Output 3555 ml  Net -2847.77 ml   Last 3 Weights 01/06/2019 01/05/2019 01/05/2019  Weight (lbs) 178 lb 4.8 oz 182 lb 3.2 oz 160 lb  Weight (kg) 80.876 kg 82.645 kg 72.576 kg      Telemetry    NSR with PAC's and PVC's. - Personally Reviewed  ECG    No new tracing  Physical Exam   GEN: No acute distress.   Neck: JVP ~8-10 cm Cardiac: RRR, no murmurs, rubs, or gallops.  Respiratory: Clear to auscultation bilaterally. GI: Firm and moderately distended.  Non-tender. MS: Trace BLE edema in the calves. Neuro:  Nonfocal  Psych: Normal affect   Labs    Chemistry Recent Labs  Lab 01/05/19 1037 01/05/19 1320 01/05/19 1637 01/05/19 2159 01/06/19 0421  NA 147*  --   --   --  150*  K <2.0*  --  <2.0* <2.0* <2.0*  CL 102  --   --   --  101  CO2 36*  --   --   --  39*  GLUCOSE 93  --   --   --  72  BUN 20  --   --   --  15  CREATININE 1.30*  --   --   --  1.11  CALCIUM  7.4*  --   --   --  6.8*  PROT  --  5.8*  --   --   --   ALBUMIN  --  2.9*  --   --   --   AST  --  333*  --   --   --   ALT  --  120*  --   --   --   ALKPHOS  --  74  --   --   --   BILITOT  --  1.4*  --   --   --   GFRNONAA 55*  --   --   --  >60  GFRAA >60  --   --   --  >60  ANIONGAP 9  --   --   --  10     Hematology Recent Labs  Lab 01/05/19 1037 01/06/19 0421  WBC 7.8 5.5  RBC 4.17* 4.13*  HGB 11.0* 10.5*  HCT 35.1* 34.1*  MCV 84.2 82.6  MCH 26.4 25.4*  MCHC 31.3  30.8  RDW 17.2* 17.1*  PLT 201 152    Cardiac Enzymes Recent Labs  Lab 01/05/19 1320 01/05/19 1637 01/05/19 2159 01/06/19 0421  TROPONINI 0.16* 0.16* 0.16* 0.12*   No results for input(s): TROPIPOC in the last 168 hours.   BNP Recent Labs  Lab 01/05/19 1208  BNP 2,927.0*     DDimer No results for input(s): DDIMER in the last 168 hours.   Radiology    Dg Chest 2 View  Result Date: 01/05/2019 CLINICAL DATA:  Falling. Increased weakness. Lower extremity swelling. EXAM: CHEST - 2 VIEW COMPARISON:  CT abdomen 03/10/2016. FINDINGS: Cardiomegaly with mild bilateral from interstitial prominence consistent with mild interstitial edema. No pleural effusion or pneumothorax. Prominent colonic distention consistent with colonic ileus or colonic obstruction. Abdominal series suggested for further evaluation. IMPRESSION: 1. Cardiomegaly with bilateral pulmonary interstitial prominence consistent with congestive heart failure and mild bilateral interstitial edema. 2. Prominent colonic distention consistent with colonic ileus or colonic obstruction. Abdominal series suggested for further evaluation. Electronically Signed   By: Marcello Moores  Register   On: 01/05/2019 11:01   Ct Head Wo Contrast  Result Date: 01/05/2019 CLINICAL DATA:  Multiple falls in the past 2 weeks due to increased weakness. Bilateral lower extremity swelling. EXAM: CT HEAD WITHOUT CONTRAST TECHNIQUE: Contiguous axial images were obtained from the  base of the skull through the vertex without intravenous contrast. COMPARISON:  None. FINDINGS: Brain: There is no evidence of acute infarct, intracranial hemorrhage, mass, midline shift, or extra-axial fluid collection. Mild cerebral atrophy is within normal limits for age. Vascular: Calcified atherosclerosis at the skull base. No hyperdense vessel. Skull: No fracture or focal osseous lesion. Sinuses/Orbits: Opacification of a posterior left ethmoid air cell. Minimal focal mucosal thickening or tiny mucous retention cyst in the right sphenoid sinus. Clear mastoid air cells. Left cataract extraction. Left scleral buckle. Other: Partially visualized 10 mm subcutaneous nodule in the posterior upper neck just left of midline, possibly a sebaceous cyst but incompletely evaluated. IMPRESSION: Unremarkable CT appearance of the brain for age. Electronically Signed   By: Logan Bores M.D.   On: 01/05/2019 13:48   Ct Abdomen Pelvis W Contrast  Result Date: 01/05/2019 CLINICAL DATA:  Abdominal bloating.  Abdominal infection. EXAM: CT ABDOMEN AND PELVIS WITH CONTRAST TECHNIQUE: Multidetector CT imaging of the abdomen and pelvis was performed using the standard protocol following bolus administration of intravenous contrast. CONTRAST:  155m OMNIPAQUE IOHEXOL 300 MG/ML  SOLN COMPARISON:  Radiographs dated 07/18/2018 and CT scan dated 03/10/2016 FINDINGS: Lower chest: New small right pleural effusion. Heart size is normal. Tiny hiatal hernia. Hepatobiliary: No focal liver abnormality is seen. No gallstones, gallbladder wall thickening, or biliary dilatation. Pancreas: Unremarkable. No pancreatic ductal dilatation or surrounding inflammatory changes. Spleen: Normal in size without focal abnormality. Adrenals/Urinary Tract: There is a single bubble of air in a calyx of the mid left kidney. 9 mm cyst in the mid right kidney. Exophytic 3.2 cm cyst in the mid left kidney. There are numerous large stones in the bladder, increased  since 2017. There is air in the bladder, which was also demonstrated on the prior exam of 2017. Stomach/Bowel: There are multiple small bubbles of free air scattered throughout the abdomen. There appears to be a site of perforation of the sigmoid portion of the colon at the site of transition from gaseous distention to narrowing and thickening of the mucosa of the sigmoid. This is best seen on images 76 and 77 of series 7 and image 65  of series 2. There is marked gaseous distention of the colon from the cecum to the proximal sigmoid region. There are multiple diverticula in the sigmoid region just distal to the transition. Vascular/Lymphatic: Slight aortic atherosclerosis. No enlarged abdominal or pelvic lymph nodes. Reproductive: Prostate is unremarkable. Other: Diffuse prominent anasarca. There is only a tiny amount of ascites in the abdomen. Musculoskeletal: No acute or significant osseous findings. IMPRESSION: 1. Free air in the abdomen consistent with bowel perforation. The site of perforation appears to be in the mid sigmoid region at the transition point between gaseous distention and bowel wall thickening of the colon. The site of transition could represent inflammation or tumor in the colon. 2. Air in the bladder and left renal collecting system with numerous stones in the bladder. 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 The patient had stones and air in the bladder on the prior study of 2017. 3. New small right pleural effusion. 4. New extensive anasarca. Critical Value/emergent results were called by telephone at the time of interpretation on 01/05/2019 at 1:21 pm to Dr. Corinna Capra , who verbally acknowledged these results. Electronically Signed   By: Lorriane Shire M.D.   On: 01/05/2019 13:26   US Venous Img Lower Bilateral  Result Date: 01/05/2019 CLINICAL DATA:  Bilateral lower extremity swelling. EXAM: BILATERAL LOWER EXTREMITY VENOUS DOPPLER ULTRASOUND TECHNIQUE: Gray-scale sonography with  graded compression, as well as color Doppler and duplex ultrasound were performed to evaluate the lower extremity deep venous systems from the level of the common femoral vein and including the common femoral, femoral, profunda femoral, popliteal and calf veins including the posterior tibial, peroneal and gastrocnemius veins when visible. The superficial great saphenous vein was also interrogated. Spectral Doppler was utilized to evaluate flow at rest and with distal augmentation maneuvers in the common femoral, femoral and popliteal veins. COMPARISON:  None. FINDINGS: RIGHT LOWER EXTREMITY Common Femoral Vein: No evidence of thrombus. Normal compressibility, respiratory phasicity and response to augmentation. Saphenofemoral Junction: No evidence of thrombus. Normal compressibility and flow on color Doppler imaging. Profunda Femoral Vein: No evidence of thrombus. Normal compressibility and flow on color Doppler imaging. Femoral Vein: No evidence of thrombus. Normal compressibility, respiratory phasicity and response to augmentation. Popliteal Vein: No evidence of thrombus. Normal compressibility, respiratory phasicity and response to augmentation. Calf Veins: Visualized right deep calf veins are patent without thrombus. Other Findings:  None. LEFT LOWER EXTREMITY Common Femoral Vein: No evidence of thrombus. Normal compressibility, respiratory phasicity and response to augmentation. Saphenofemoral Junction: No evidence of thrombus. Normal compressibility and flow on color Doppler imaging. Profunda Femoral Vein: No evidence of thrombus. Normal compressibility and flow on color Doppler imaging. Femoral Vein: No evidence of thrombus. Normal compressibility, respiratory phasicity and response to augmentation. Popliteal Vein: No evidence of thrombus. Normal compressibility, respiratory phasicity and response to augmentation. Calf Veins: Visualized left deep calf veins are patent without thrombus. Other Findings:  Hypoechoic collection in the left popliteal fossa is suggestive for a complex fluid collection. This structure measures 7.7 x 1.3 x 2.2 cm. Findings are most compatible with a small complex Baker's cyst. IMPRESSION: No evidence of deep venous thrombosis in the lower extremities. Small complex fluid collection in the left popliteal fossa. Findings are compatible with a Baker's cyst. Electronically Signed   By: Markus Daft M.D.   On: 01/05/2019 14:27    Cardiac Studies   TTE (01/06/2019): - Left ventricle: The cavity size was at the upper limits of   normal. Wall thickness was normal.  Systolic function was   moderately to severely reduced. The estimated ejection fraction   was in the range of 30% to 35%. Diffuse hypokinesis. Regional   wall motion abnormalities cannot be excluded. Features are   consistent with a pseudonormal left ventricular filling pattern,   with concomitant abnormal relaxation and increased filling   pressure (grade 2 diastolic dysfunction). Doppler parameters are   consistent with high ventricular filling pressure. - Aortic valve: Trileaflet; mildly thickened leaflets. - Mitral valve: Mildly thickened leaflets . There was mild   regurgitation. - Left atrium: The atrium was moderately dilated. - Right ventricle: The cavity size was normal. Systolic function   was moderately reduced. - Pericardium, extracardiac: A trivial pericardial effusion was   identified. There was a right pleural effusion.  Patient Profile     73 y.o. male with a hx of sigmoid diverticulosis, chronic diarrhea, and intellectual slowing, whom we have been asked to see due to acute heart failure and preoperative cardiac risk assessment in the setting of bowel perforation.  Assessment & Plan    Acute systolic heart failure Echo shows LVEF 30-35%.  Patient still appears mildly volume overloaded on exam, though he denies symptoms.  Further diuresis still precluded by profound hypokalemia.  Defer  diuresis until potassium has normalized.  If able to take PO, could consider adding spironolactone to augment potassium retention.  Defer adding BB and ACEI/ARB in the setting of soft blood pressure.  Defer ischemia evaluation in the setting of bowel perforation.  Hypokalemia Still very profound despite aggressive repletion.  Most likely due to GI losses, but concurrent RTA or other endocrine/metabolic cause is a consideration.  Continue aggressive repletion for target K > 4.0.  If marked hypokalemia persists, consultation with nephrology may be helpful.  Bowel perforation and preop risk assessment The patient is high risk for elective surgery.  However, in the setting of bowel perforation, surgery should not be delayed if it can improve the patient's morbidity and mortality.  Adequate repletion of potassium would be beneficial to minimize the risk for arrhythmias during/after surgery.  Anemia Stable to slightly downtrending.  Defer management to IM.  For questions or updates, please contact [place] Please consult [email or web address] for contact info under [facility].     Signed, Nelva Bush, MD  01/06/2019, 9:26 AM

## 2019-01-06 NOTE — Progress Notes (Signed)
Dr Bridgett Larsson and Dr. Saunders Revel paged for pt having a 14 beat-run of Vtach. Asymptomatic. VSS. Dr. Saunders Revel aware per return phone call. Will continue to monitor.

## 2019-01-06 NOTE — Consult Note (Signed)
PHARMACY CONSULT NOTE - FOLLOW UP  Pharmacy Consult for Electrolyte Monitoring and Replacement   Recent Labs: Potassium (mmol/L)  Date Value  01/06/2019 3.0 (L)   Magnesium (mg/dL)  Date Value  01/06/2019 1.9   Calcium (mg/dL)  Date Value  01/06/2019 6.8 (L)   Albumin (g/dL)  Date Value  01/05/2019 2.9 (L)   Phosphorus (mg/dL)  Date Value  01/06/2019 3.2   Sodium (mmol/L)  Date Value  01/06/2019 150 (H)     Assessment: 73 y.o. 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 Patient says on an average that he has anywhere between 4-10 stools every day.  CT of the abdomen revealing possible bowel perforation and free air in the abdomen. Patient is cdiff (-), but continues to have diarrhea.  Goal of Therapy:  K ~ 4.0 Mg ~ 2.0 Phos 2.5 - 4.5  Plan:  K - 3.0, Patient has received a total of 6 doses of KCl 40 mEq PO and  8 doses of KCl 10 mEq IV since 0000 today.  With the most recent dose being a oral dose at approximately 1700.  Their level has gone from <2.0 to now 3.0 since morning labs. Due to the most recent dose being within 1 hour of the lab draw and being SR oral tablet will hold additional oral replacement and will order KCl 10 mEq IV x 4 doses.  Will order an additional level to be drawn 1/15 @ 0200   Mg - 1.9, stable and WNL.  Will hold additional supplementation at this time.  Forrest Moron, PharmD Clinical Pharmacist 01/06/2019 6:33 PM

## 2019-01-06 NOTE — Progress Notes (Signed)
[place] [facility] [place] NAME: William Holland    MR#:  [id number]  DATE OF BIRTH:  1946/05/17  SUBJECTIVE:  CHIEF COMPLAINT:   Chief Complaint  Patient presents with  . Leg Swelling  . Weakness   The patient has complaints except diarrhea. REVIEW OF SYSTEMS:  Review of Systems  Constitutional: Negative for chills, fever and malaise/fatigue.  HENT: Negative for sore throat.   Eyes: Negative for blurred vision and double vision.  Respiratory: Negative for cough, hemoptysis, shortness of breath, wheezing and stridor.   Cardiovascular: Negative for chest pain, palpitations, orthopnea and leg swelling.  Gastrointestinal: Positive for diarrhea. Negative for abdominal pain, blood in stool, melena, nausea and vomiting.  Genitourinary: Negative for dysuria, flank pain and hematuria.  Musculoskeletal: Negative for back pain and joint pain.  Skin: Negative for rash.  Neurological: Negative for dizziness, sensory change, focal weakness, seizures, loss of consciousness, weakness and headaches.  Endo/Heme/Allergies: Negative for polydipsia.  Psychiatric/Behavioral: Negative for depression. The patient is not nervous/anxious.     DRUG ALLERGIES:  No Known Allergies VITALS:  Blood pressure (!) 105/93, pulse 89, temperature (!) 97.5 F (36.4 C), temperature source Oral, resp. rate 17, height 5' 9"  (1.753 m), weight 80.9 kg, SpO2 98 %. PHYSICAL EXAMINATION:  Physical Exam Constitutional:      General: He is not in acute distress. HENT:     Head: Normocephalic.     Mouth/Throat:     Mouth: Mucous membranes are moist.  Eyes:     General: No scleral icterus.    Conjunctiva/sclera: Conjunctivae normal.     Pupils: Pupils are equal, round, and reactive to light.  Neck:     Musculoskeletal: Normal range of motion and neck supple.     Vascular: No JVD.     Trachea: No tracheal deviation.  Cardiovascular:     Rate and Rhythm: Normal rate and regular  rhythm.     Heart sounds: Normal heart sounds. No murmur. No gallop.   Pulmonary:     Effort: Pulmonary effort is normal. No respiratory distress.     Breath sounds: Normal breath sounds. No wheezing or rales.  Abdominal:     General: Bowel sounds are normal. There is distension.     Palpations: Abdomen is soft.     Tenderness: There is abdominal tenderness. There is no rebound.  Musculoskeletal: Normal range of motion.        General: No tenderness.     Right lower leg: No edema.     Left lower leg: No edema.  Skin:    Findings: No erythema or rash.  Neurological:     General: No focal deficit present.     Mental Status: He is alert and oriented to person, place, and time.     Cranial Nerves: No cranial nerve deficit.    LABORATORY PANEL:  Male CBC Recent Labs  Lab 01/06/19 0421  WBC 5.5  HGB 10.5*  HCT 34.1*  PLT 152   ------------------------------------------------------------------------------------------------------------------ Chemistries  Recent Labs  Lab 01/05/19 1320  01/06/19 0421  01/06/19 1356  NA  --   --  150*  --   --   K  --    < > <2.0*   < > 2.8*  CL  --   --  101  --   --   CO2  --   --  39*  --   --   GLUCOSE  --   --  72  --   --   BUN  --   --  15  --   --   CREATININE  --   --  1.11  --   --   CALCIUM  --   --  6.8*  --   --   MG  --   --  1.9  --   --   AST 333*  --   --   --   --   ALT 120*  --   --   --   --   ALKPHOS 74  --   --   --   --   BILITOT 1.4*  --   --   --   --    < > = values in this interval not displayed.   RADIOLOGY:  No results found. ASSESSMENT AND PLAN:   Quran Vasco  is a 73 y.o. 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  1.  Acute systolic CHF, LV EF: 50% -   35% Hold IV Lasix due to significantly low  potassium per cardiology consult.  2.  Elevated troponin-could be demand ischemia due to CHF. -Patient denies any chest pain.  No significant EKG changes.   3.  Perforation of bowel-known history of significant diverticulosis.  Last colonoscopy in March 2019 -Likely diverticular perforation.  Nontoxic-appearing, no signs of peritonitis. Continue Zosyn empirically, keep n.p.o. High risk for surgery, keep n.p.o. with medical treatment first care per Dr. Celine Ahr.  4.  Hypokalemia-being replaced.  Potassium is still low at 2.8. -Pharmacy consult for electrolyte management  5.  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  Could be liver cirrhosis with elevated bilirubin and AST greater than ALT. -Ultrasound of the liver.  I discussed with Dr. Celine Ahr. All the records are reviewed and case discussed with Care Management/Social Worker. Management plans discussed with the patient, family and they are in agreement.  CODE STATUS: Full Code  TOTAL TIME TAKING CARE OF THIS PATIENT: 33 minutes.   More than 50% of the time was spent in counseling/coordination of care: YES  POSSIBLE D/C IN >3 DAYS, DEPENDING ON CLINICAL CONDITION.   Demetrios Loll M.D on 01/06/2019 at 2:26 PM  Between 7am to 6pm - Pager - [phone]  After 6pm go to [email or web address] - [facility]

## 2019-01-06 NOTE — Plan of Care (Signed)
Patient has a large quantity of output. Potassium level has not increased. Patient will receive more intravenous and oral potassium.

## 2019-01-06 NOTE — Consult Note (Signed)
PHARMACY CONSULT NOTE - FOLLOW UP  Pharmacy Consult for Electrolyte Monitoring and Replacement   Recent Labs: Potassium (mmol/L)  Date Value  01/06/2019 <2.0 (LL)   Magnesium (mg/dL)  Date Value  01/06/2019 1.9   Calcium (mg/dL)  Date Value  01/06/2019 6.8 (L)   Albumin (g/dL)  Date Value  01/05/2019 2.9 (L)   Phosphorus (mg/dL)  Date Value  01/06/2019 3.2   Sodium (mmol/L)  Date Value  01/06/2019 150 (H)     Assessment: 73 y.o. 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 Patient says on an average that he has anywhere between 4-10 stools every day.  CT of the abdomen revealing possible bowel perforation and free air in the abdomen.  Goal of Therapy:  K ~ 4.0 Mg ~ 2.0 Phos 2.5 - 4.5  Plan:  01/14 @ 0500 K+ still < 2.0; Phos 3.2 WNL, and Mag 1.9 WNL. Will replace K with KCI 40 mEq PO x 3 and will recheck w/ q4h labs.  Tobie Lords ,PharmD Clinical Pharmacist 01/06/2019 5:24 AM

## 2019-01-06 NOTE — Consult Note (Signed)
PHARMACY CONSULT NOTE - FOLLOW UP  Pharmacy Consult for Electrolyte Monitoring and Replacement   Recent Labs: Potassium (mmol/L)  Date Value  01/06/2019 <2.0 (LL)   Magnesium (mg/dL)  Date Value  01/06/2019 1.9   Calcium (mg/dL)  Date Value  01/06/2019 6.8 (L)   Albumin (g/dL)  Date Value  01/05/2019 2.9 (L)   Phosphorus (mg/dL)  Date Value  01/06/2019 3.2   Sodium (mmol/L)  Date Value  01/06/2019 150 (H)     Assessment: 73 y.o. 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 Patient says on an average that he has anywhere between 4-10 stools every day.  CT of the abdomen revealing possible bowel perforation and free air in the abdomen. Patient is cdiff (-), but continues to have diarrhea.  Goal of Therapy:  K ~ 4.0 Mg ~ 2.0 Phos 2.5 - 4.5  Plan:  01/14 @ 1000 K+  2.2. Patient has received KCl 10 mEq x 4 and KCl 40 mEq q4h x 4 doses since midnight    Will replace K with KCI 40 mEq PO x 2, 4 hours after last dose of 0830 this AM. Also ordered KCl 10 mEq IV x 4. Will recheck potassium 1800.   Paticia Stack, PharmD Pharmacy Resident  01/06/2019 10:31 AM

## 2019-01-06 NOTE — Progress Notes (Addendum)
[facility] SURGICAL PROGRESS NOTE (cpt [phone])  Hospital Day(s): 1.   Post op day(s): Day of Surgery.   Interval History: Patient seen and examined, no acute events or new complaints overnight. Patient denies any abdominal pain this morning and denied any fevers, chills, nausea, or emesis. Still able to pass flatus and having bowel movements. The hospitalist team has been aggressively trying to correct his electrolyte abnormalities, however his potassium remains less than 2 and now his sodium is 150.  Cardiology saw him yesterday and determined that he was high risk for surgery.  He has remained NPO.  Review of Systems:  Constitutional: denies fever, chills  Respiratory: denies any shortness of breath  Cardiovascular: denies chest pain or palpitations  Gastrointestinal: denies abdominal pain, N/V, as per interval history Genitourinary: denies burning with urination or urinary frequency   Vital signs in last 24 hours: [min-max] current  Temp:  [97.5 F (36.4 C)-97.9 F (36.6 C)] 97.5 F (36.4 C) (01/14 0758) Pulse Rate:  [70-78] 72 (01/14 0758) Resp:  [15-34] 17 (01/14 0405) BP: (89-127)/(42-70) 107/49 (01/14 0758) SpO2:  [91 %-100 %] 98 % (01/14 0758) Weight:  [72.6 kg-82.6 kg] 80.9 kg (01/14 0332)     Height: 5' 9"  (175.3 cm) Weight: 80.9 kg BMI (Calculated): 26.32   Intake/Output this shift:  Total I/O In: -  Out: 50 [Urine:50]   Intake/Output last 2 shifts:  @IOLAST2SHIFTS @   Physical Exam:  Constitutional: alert, cooperative and no distress  HENT: normocephalic without obvious abnormality  Eyes: EOM's grossly intact and symmetric  Respiratory: breathing non-labored at rest  Gastrointestinal: soft, non-tender, moderately distended, no rebound or guarding Musculoskeletal: +2 pitting edema to bilateral lower extremities to level of knee   Labs:  CBC Latest Ref Rng & Units 01/06/2019 01/05/2019 07/18/2018  WBC 4.0 - 10.5 K/uL 5.5 7.8 7.2  Hemoglobin 13.0  - 17.0 g/dL 10.5(L) 11.0(L) 12.2(L)  Hematocrit 39.0 - 52.0 % 34.1(L) 35.1(L) 36.8(L)  Platelets 150 - 400 K/uL 152 201 218   CMP Latest Ref Rng & Units 01/06/2019 01/05/2019 01/05/2019  Glucose 70 - 99 mg/dL 72 - -  BUN 8 - 23 mg/dL 15 - -  Creatinine 0.61 - 1.24 mg/dL 1.11 - -  Sodium 135 - 145 mmol/L 150(H) - -  Potassium 3.5 - 5.1 mmol/L <2.0(LL) <2.0(LL) <2.0(LL)  Chloride 98 - 111 mmol/L 101 - -  CO2 22 - 32 mmol/L 39(H) - -  Calcium 8.9 - 10.3 mg/dL 6.8(L) - -  Total Protein 6.5 - 8.1 g/dL - - -  Total Bilirubin 0.3 - 1.2 mg/dL - - -  Alkaline Phos 38 - 126 U/L - - -  AST 15 - 41 U/L - - -  ALT 0 - 44 U/L - - -     Assessment/Plan: (ICD-10's: K63.1) 73 y.o. male with severe hypokalemia, hypernatremia, and acute CHF with radiographic findings consistent with bowel perforation who is otherwise hemodynamically stable and withut evidence of peritonitis, complicated by pertinent comorbidities including chronic diarrhea and lack of previous medical care. Given his lack of worrisome abdominal findings on exam, it seems that perhaps this perforation is older and was only detected due to his presentation in the ED for other reasons. Nonetheless, he should undergo surgical intervention, however he is not toxic or unstable as a result of the perforation and I believe we can safely delay operating until he is better optimized from a medical perspective.   - NPO + conservative IVF resuscitation given CHF  -  Continue IV ABx (Zosyn), pain control PRN, Antiemetics PRN  - Continue to monitor abdominal examination  - Again, patient will require exploratory laparotomy and likely subtotal colectomy however given current condition and electrolyte abnormalities he will require further medical management prior to proceeding, which he was understanding of.   - Appreciate cardiology input  - Medical management per primary team; discussed with Dr. Bridgett Larsson this morning to consider adding spironolactone to diurese  but spare potassium losses.   All of the above findings and recommendations were discussed with the patient, and the medical team, and all of patient's questions were answered to his expressed satisfaction.  -- [name], PA-C [facility] 01/06/2019, 9:24 AM [phone] M-F: 7am - 4pm  I saw and evaluated the patient.  I agree with the above documentation, exam, and plan, which I have edited where appropriate. [name]  10:23 AM

## 2019-01-07 DIAGNOSIS — I248 Other forms of acute ischemic heart disease: Secondary | ICD-10-CM

## 2019-01-07 DIAGNOSIS — R945 Abnormal results of liver function studies: Secondary | ICD-10-CM

## 2019-01-07 DIAGNOSIS — R601 Generalized edema: Secondary | ICD-10-CM

## 2019-01-07 DIAGNOSIS — R197 Diarrhea, unspecified: Secondary | ICD-10-CM

## 2019-01-07 DIAGNOSIS — F1010 Alcohol abuse, uncomplicated: Secondary | ICD-10-CM

## 2019-01-07 LAB — CBC
HEMATOCRIT: 36.3 % — AB (ref 39.0–52.0)
Hemoglobin: 11 g/dL — ABNORMAL LOW (ref 13.0–17.0)
MCH: 25.4 pg — ABNORMAL LOW (ref 26.0–34.0)
MCHC: 30.3 g/dL (ref 30.0–36.0)
MCV: 83.8 fL (ref 80.0–100.0)
NRBC: 0 % (ref 0.0–0.2)
Platelets: 172 10*3/uL (ref 150–400)
RBC: 4.33 MIL/uL (ref 4.22–5.81)
RDW: 17.1 % — ABNORMAL HIGH (ref 11.5–15.5)
WBC: 9.8 10*3/uL (ref 4.0–10.5)

## 2019-01-07 LAB — BASIC METABOLIC PANEL
Anion gap: 10 (ref 5–15)
Anion gap: 13 (ref 5–15)
BUN: 16 mg/dL (ref 8–23)
BUN: 16 mg/dL (ref 8–23)
CHLORIDE: 101 mmol/L (ref 98–111)
CO2: 35 mmol/L — ABNORMAL HIGH (ref 22–32)
CO2: 33 mmol/L — AB (ref 22–32)
CREATININE: 1.08 mg/dL (ref 0.61–1.24)
Calcium: 7.2 mg/dL — ABNORMAL LOW (ref 8.9–10.3)
Calcium: 7.4 mg/dL — ABNORMAL LOW (ref 8.9–10.3)
Chloride: 103 mmol/L (ref 98–111)
Creatinine, Ser: 1.08 mg/dL (ref 0.61–1.24)
GFR calc Af Amer: >60 mL/min (ref >60)
GFR calc non Af Amer: >60 mL/min (ref >60)
GFR calc non Af Amer: >60 mL/min (ref >60)
GLUCOSE: 80 mg/dL (ref 70–99)
Glucose, Bld: 68 mg/dL — ABNORMAL LOW (ref 70–99)
Potassium: 3.2 mmol/L — ABNORMAL LOW (ref 3.5–5.1)
Potassium: 2.9 mmol/L — ABNORMAL LOW (ref 3.5–5.1)
SODIUM: 148 mmol/L — AB (ref 135–145)
Sodium: 147 mmol/L — ABNORMAL HIGH (ref 135–145)

## 2019-01-07 LAB — URINE CULTURE

## 2019-01-07 LAB — MAGNESIUM: Magnesium: 1.8 mg/dL (ref 1.7–2.4)

## 2019-01-07 LAB — TSH: TSH: 6.424 u[IU]/mL — AB (ref 0.350–4.500)

## 2019-01-07 LAB — POTASSIUM: Potassium: 3.1 mmol/L — ABNORMAL LOW (ref 3.5–5.1)

## 2019-01-07 LAB — T4, FREE: Free T4: 0.78 ng/dL — ABNORMAL LOW (ref 0.82–1.77)

## 2019-01-07 MED ORDER — POTASSIUM CHLORIDE 20 MEQ PO PACK
40.0000 meq | PACK | ORAL | Status: AC
  Administered 2019-01-07: 40 meq via ORAL
  Filled 2019-01-07: qty 2

## 2019-01-07 MED ORDER — POTASSIUM CHLORIDE CRYS ER 20 MEQ PO TBCR
40.0000 meq | EXTENDED_RELEASE_TABLET | ORAL | Status: DC
  Administered 2019-01-07: 40 meq via ORAL
  Filled 2019-01-07: qty 2

## 2019-01-07 MED ORDER — NEOMYCIN SULFATE 500 MG PO TABS
1000.0000 mg | ORAL_TABLET | Freq: Three times a day (TID) | ORAL | Status: AC
  Administered 2019-01-08 (×2): 1000 mg via ORAL
  Filled 2019-01-07 (×3): qty 2

## 2019-01-07 MED ORDER — POTASSIUM CHLORIDE CRYS ER 20 MEQ PO TBCR
40.0000 meq | EXTENDED_RELEASE_TABLET | ORAL | Status: AC
  Administered 2019-01-07 (×2): 40 meq via ORAL
  Filled 2019-01-07 (×2): qty 2

## 2019-01-07 MED ORDER — SODIUM CHLORIDE 0.9 % IV SOLN
INTRAVENOUS | Status: DC
  Administered 2019-01-07: 10 mL/h via INTRAVENOUS

## 2019-01-07 MED ORDER — NEOMYCIN SULFATE 500 MG PO TABS
1000.0000 mg | ORAL_TABLET | Freq: Three times a day (TID) | ORAL | Status: DC
  Filled 2019-01-07 (×3): qty 2

## 2019-01-07 MED ORDER — POTASSIUM CHLORIDE 10 MEQ/100ML IV SOLN
10.0000 meq | INTRAVENOUS | Status: AC
  Administered 2019-01-07 (×4): 10 meq via INTRAVENOUS
  Filled 2019-01-07 (×4): qty 100

## 2019-01-07 MED ORDER — METRONIDAZOLE 500 MG PO TABS
500.0000 mg | ORAL_TABLET | Freq: Three times a day (TID) | ORAL | Status: AC
  Administered 2019-01-08 (×2): 500 mg via ORAL
  Filled 2019-01-07 (×3): qty 1

## 2019-01-07 MED ORDER — METRONIDAZOLE 500 MG PO TABS
500.0000 mg | ORAL_TABLET | Freq: Three times a day (TID) | ORAL | Status: DC
  Filled 2019-01-07 (×3): qty 1

## 2019-01-07 MED ORDER — DEXTROSE 5 % IV SOLN
INTRAVENOUS | Status: AC
  Administered 2019-01-07 – 2019-01-08 (×2): via INTRAVENOUS

## 2019-01-07 MED ORDER — MAGNESIUM CITRATE PO SOLN
1.0000 | Freq: Two times a day (BID) | ORAL | Status: DC
  Filled 2019-01-07: qty 296

## 2019-01-07 MED ORDER — MAGNESIUM CITRATE PO SOLN
1.0000 | Freq: Two times a day (BID) | ORAL | Status: DC
  Administered 2019-01-08 (×2): 1 via ORAL
  Filled 2019-01-07 (×3): qty 296

## 2019-01-07 MED ORDER — POTASSIUM CHLORIDE 10 MEQ/100ML IV SOLN
10.0000 meq | INTRAVENOUS | Status: AC
  Administered 2019-01-07 – 2019-01-08 (×6): 10 meq via INTRAVENOUS
  Filled 2019-01-07 (×6): qty 100

## 2019-01-07 NOTE — Consult Note (Signed)
[employer] Nurse ostomy consult note  [place] Nurse requested for preoperative stoma site marking by Dr. Celine Ahr. Markings for end ileostomy and colostomy are requested.  Discussed surgical procedure and stoma creation briefly with patient.  Explained role of the [facility] nurse team. Patient reports that surgery will be tomorrow (Thursday or Friday). No family in room.  Examined patient lying, sitting, and standing in order to place the marking in the patient's visual field, away from any creases or abdominal contour issues and within the rectus muscle.  Patient wears his pants very low on the abdomen (suprapubic area) and the umbilicus is very low. There are numerous wrinkles and skin folds in the lower abdominal area. Please note that he is unable to see marks in the middle or lower quadrants needed to perform self care.  Marked for colostomy in the LUQ  4.5cm to the left of the umbilicus and 81.1WA above the umbilicus.  Marked for  ileostomy in the RUQ  9cm to the right of the umbilicus and  67RJ above the umbilicus.  Patient's abdomen cleansed with CHG wipes at site markings and allowed to air dry prior to marking. Covered marks with thin film transparent dressings to preserve until date of surgery.   [place] nursing team will follow from a distance, and will remain available to this patient, the nursing, surgical and medical teams in the event an ostomy is created intraoperatively.  Please re-consult if that is the case.  Thank you for involving [place] in the care of this nice gentleman. Maudie Flakes, MSN, RN, [place], [facility]  Pager# [phone]

## 2019-01-07 NOTE — Consult Note (Signed)
PHARMACY CONSULT NOTE - FOLLOW UP  Pharmacy Consult for Electrolyte Monitoring and Replacement   Recent Labs: Potassium (mmol/L)  Date Value  01/07/2019 3.1 (L)   Magnesium (mg/dL)  Date Value  01/07/2019 1.8   Calcium (mg/dL)  Date Value  01/07/2019 7.4 (L)   Albumin (g/dL)  Date Value  01/05/2019 2.9 (L)   Phosphorus (mg/dL)  Date Value  01/06/2019 3.2   Sodium (mmol/L)  Date Value  01/07/2019 147 (H)     Assessment: 73 y.o. 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 Patient says on an average that he has anywhere between 4-10 stools every day.  CT of the abdomen revealing possible bowel perforation and free air in the abdomen. Patient is cdiff (-), but continues to have diarrhea.  Goal of Therapy:  K ~ 4.0 Mg ~ 2.0 Phos 2.5 - 4.5  Plan:  1800 K - 3.1 Due to continued diarrhea likely causing decreased absorption and minimal change in K level will order IV doses only for the patient.   KCl 10 mEq IV every 1 hour for 6 doses.  Will draw level with morning labs since the runs will end around 0030 on 1/16.   Forrest Moron, PharmD Clinical Pharmacist 01/07/2019 6:53 PM

## 2019-01-07 NOTE — Progress Notes (Addendum)
[facility] SURGICAL PROGRESS NOTE (cpt [phone])  Hospital Day(s): 2.   Post op day(s): N/A   Interval History: Patient seen and examined, no acute events or new complaints overnight. Patient reports that he had an episode of emesis overnight. Patient's RN reports that he has been dry heaving and having small amounts of clear emesis with positional changes intermittently. Patient reports that he does have mild abdominal pain but no complaints of fever, chills, CP, or SOB. He did have a rectal tube placed with stool present. Medicine has been aggressively correcting electrolyte abnormalities and K+ is 3.2 and Na+ is 148 this morning.    Review of Systems:  Constitutional: denies fever, chills  Respiratory: denies any shortness of breath  Cardiovascular: denies chest pain or palpitations  Gastrointestinal: + Abdominal pain, + nausea, + emesis, + diarrhea/and bowel function as per interval history Musculoskeletal: + peripheral edema  Vital signs in last 24 hours: [min-max] current  Temp:  [97.4 F (36.3 C)-98 F (36.7 C)] 97.7 F (36.5 C) (01/15 0810) Pulse Rate:  [80-89] 81 (01/15 0810) Resp:  [20] 20 (01/15 0411) BP: (101-110)/(45-93) 104/52 (01/15 0810) SpO2:  [93 %-98 %] 98 % (01/15 0810) Weight:  [77.2 kg] 77.2 kg (01/15 0411)     Height: 5' 9"  (175.3 cm) Weight: 77.2 kg BMI (Calculated): 25.14   Intake/Output this shift:  I/O last 3 completed shifts: In: 871.8 [IV Piggyback:871.8] Out: 1062 [Urine:2875; Stool:600] No intake/output data recorded.     Physical Exam:  Constitutional: alert, cooperative and no distress  HENT: normocephalic without obvious abnormality  Eyes: EOM's grossly intact and symmetric  Respiratory: breathing non-labored at rest  Cardiovascular: regular rate and sinus rhythm  Gastrointestinal: non-tender, distended. No rebound or guarding. Rectal tube present with stool in bag Musculoskeletal: + 2 pitting edema bilateral lower  extremities extending to distal thigh   Labs:  CBC Latest Ref Rng & Units 01/07/2019 01/06/2019 01/05/2019  WBC 4.0 - 10.5 K/uL 9.8 5.5 7.8  Hemoglobin 13.0 - 17.0 g/dL 11.0(L) 10.5(L) 11.0(L)  Hematocrit 39.0 - 52.0 % 36.3(L) 34.1(L) 35.1(L)  Platelets 150 - 400 K/uL 172 152 201   CMP Latest Ref Rng & Units 01/07/2019 01/06/2019 01/06/2019  Glucose 70 - 99 mg/dL 68(L) - -  BUN 8 - 23 mg/dL 16 - -  Creatinine 0.61 - 1.24 mg/dL 1.08 - -  Sodium 135 - 145 mmol/L 148(H) - -  Potassium 3.5 - 5.1 mmol/L 3.2(L) 3.0(L) 2.8(L)  Chloride 98 - 111 mmol/L 103 - -  CO2 22 - 32 mmol/L 35(H) - -  Calcium 8.9 - 10.3 mg/dL 7.2(L) - -  Total Protein 6.5 - 8.1 g/dL - - -  Total Bilirubin 0.3 - 1.2 mg/dL - - -  Alkaline Phos 38 - 126 U/L - - -  AST 15 - 41 U/L - - -  ALT 0 - 44 U/L - - -     Assessment/Plan: (ICD-10's: K63.1) 73 y.o. male who is hemodynamically stable with improved hypokalemia and hypernatremia still with significant lower extremity edema most likely secondary to new onset CHF with known (likely chronic) bowel perforation, which is complicated by pertinent comorbidities including chronic diarrhea, intellectual slowing, and lack of previous medical care.   - NPO + conservative IVF resuscitation given CHF             - Continue IV ABx (Zosyn), pain control PRN, Antiemetics PRN             - Continue  to monitor abdominal examination   - Again, patient will require exploratory laparotomy and possible subtotal colectomy with end ileostomy vs Hartmann procedure vs sigmoid colectomy with primary anastomosis. His electrolyte abnormalities are improving with aggressive repletion.  Continue to replete/manage with a goal toward K+ of 4, Magnesium of 2, Sodium WNL.    - Will give formal bowel prep Thursday in anticipation of surgery Friday  - Will consult [facility] RN for pre-operative marking for possible end ileostomy or descending colostomy. Appreciate their help.    - Appreciate cardiology input              - Medical management per primary team   All of the above findings and recommendations were discussed with the patient, and the medical team.   -- [name], PA-C [facility] 01/07/2019, 10:47 AM [phone] M-F: 7am - 4pm  I saw and evaluated the patient.  I agree with the above documentation, exam, and plan, which I have edited where appropriate. [name]  11:15 AM

## 2019-01-07 NOTE — Consult Note (Signed)
PHARMACY CONSULT NOTE - FOLLOW UP  Pharmacy Consult for Electrolyte Monitoring and Replacement   Recent Labs: Potassium (mmol/L)  Date Value  01/07/2019 2.9 (L)   Magnesium (mg/dL)  Date Value  01/06/2019 1.9   Calcium (mg/dL)  Date Value  01/07/2019 7.4 (L)   Albumin (g/dL)  Date Value  01/05/2019 2.9 (L)   Phosphorus (mg/dL)  Date Value  01/06/2019 3.2   Sodium (mmol/L)  Date Value  01/07/2019 147 (H)     Assessment: 73 y.o. 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 Patient says on an average that he has anywhere between 4-10 stools every day.  CT of the abdomen revealing possible bowel perforation and free air in the abdomen. Patient is cdiff (-), but continues to have diarrhea.  Goal of Therapy:  K ~ 4.0 Mg ~ 2.0 Phos 2.5 - 4.5  Plan:  01/15 @ 1030 K+ 2.9 will replace w/ KCI 40 mEq PO x 2 and KCl 10 mEq IV x 4.  and will recheck K/Mg @ 1800.   Paticia Stack, PharmD Pharmacy Resident  01/07/2019 10:52 AM

## 2019-01-07 NOTE — Progress Notes (Addendum)
[facility] [place] NAME: William Holland    MR#:  [id number]  DATE OF BIRTH:  1946-11-08  SUBJECTIVE:  CHIEF COMPLAINT:   Chief Complaint  Patient presents with  . Leg Swelling  . Weakness   The patient still has diarrhea but no abdominal pain.  Potassium is still low at 2.9. REVIEW OF SYSTEMS:  Review of Systems  Constitutional: Positive for malaise/fatigue. Negative for chills and fever.  HENT: Negative for sore throat.   Eyes: Negative for blurred vision and double vision.  Respiratory: Negative for cough, hemoptysis, shortness of breath, wheezing and stridor.   Cardiovascular: Negative for chest pain, palpitations, orthopnea and leg swelling.  Gastrointestinal: Positive for diarrhea. Negative for abdominal pain, blood in stool, melena, nausea and vomiting.  Genitourinary: Negative for dysuria, flank pain and hematuria.  Musculoskeletal: Negative for back pain and joint pain.  Skin: Negative for rash.  Neurological: Negative for dizziness, sensory change, focal weakness, seizures, loss of consciousness, weakness and headaches.  Endo/Heme/Allergies: Negative for polydipsia.  Psychiatric/Behavioral: Negative for depression. The patient is not nervous/anxious.     DRUG ALLERGIES:  No Known Allergies VITALS:  Blood pressure (!) 104/52, pulse 81, temperature 97.7 F (36.5 C), resp. rate 20, height 5' 9"  (1.753 m), weight 77.2 kg, SpO2 98 %. PHYSICAL EXAMINATION:  Physical Exam Constitutional:      General: He is not in acute distress. HENT:     Head: Normocephalic.     Mouth/Throat:     Mouth: Mucous membranes are moist.  Eyes:     General: No scleral icterus.    Conjunctiva/sclera: Conjunctivae normal.     Pupils: Pupils are equal, round, and reactive to light.  Neck:     Musculoskeletal: Normal range of motion and neck supple.     Vascular: No JVD.     Trachea: No tracheal deviation.  Cardiovascular:     Rate and Rhythm:  Normal rate and regular rhythm.     Heart sounds: Normal heart sounds. No murmur. No gallop.   Pulmonary:     Effort: Pulmonary effort is normal. No respiratory distress.     Breath sounds: Normal breath sounds. No wheezing or rales.  Abdominal:     General: Bowel sounds are normal. There is distension.     Palpations: Abdomen is soft.     Tenderness: There is abdominal tenderness. There is no rebound.  Musculoskeletal: Normal range of motion.        General: No tenderness.     Right lower leg: No edema.     Left lower leg: No edema.  Skin:    Findings: No erythema or rash.  Neurological:     General: No focal deficit present.     Mental Status: He is alert and oriented to person, place, and time.     Cranial Nerves: No cranial nerve deficit.    LABORATORY PANEL:  Male CBC Recent Labs  Lab 01/07/19 0204  WBC 9.8  HGB 11.0*  HCT 36.3*  PLT 172   ------------------------------------------------------------------------------------------------------------------ Chemistries  Recent Labs  Lab 01/05/19 1320  01/06/19 1749  01/07/19 1026  NA  --    < >  --    < > 147*  K  --    < > 3.0*   < > 2.9*  CL  --    < >  --    < > 101  CO2  --    < >  --    < >  33*  GLUCOSE  --    < >  --    < > 80  BUN  --    < >  --    < > 16  CREATININE  --    < >  --    < > 1.08  CALCIUM  --    < >  --    < > 7.4*  MG  --    < > 1.9  --   --   AST 333*  --   --   --   --   ALT 120*  --   --   --   --   ALKPHOS 74  --   --   --   --   BILITOT 1.4*  --   --   --   --    < > = values in this interval not displayed.   RADIOLOGY:  US Abdomen Limited Ruq  Result Date: 01/06/2019 CLINICAL DATA:  Elevated serum liver function test. EXAM: ULTRASOUND ABDOMEN LIMITED RIGHT UPPER QUADRANT COMPARISON:  CT abdomen and pelvis 01/05/2019, 03/10/2016. No prior ultrasound. FINDINGS: Gallbladder: Mild wall thickening up to 6 mm diameter with ring-down (comet tail) artifact identified arising from the  gallbladder wall. No shadowing gallstones or echogenic sludge. Negative sonographic Murphy's sign according to the ultrasound technologist. Common bile duct: Diameter: Approximately 2 mm. Liver: Nonvisualization of the LEFT lobe due to the gas-filled distended transverse colon as noted on yesterday's CT. No focal parenchymal abnormality involving the RIGHT lobe. Normal parenchymal echotexture. Portal vein is patent on color Doppler imaging with normal direction of blood flow towards the liver. Other: Peristalsis is identified within the dilated transverse colon which contains semi-solid stool. Small RIGHT pleural effusion. IMPRESSION: 1. Adenomyomatosis involving the gallbladder wall. No evidence of cholelithiasis or cholecystitis. 2. Normal appearing liver in its visualized portions. The LEFT lobe was obscured by bowel gas from the overlying distended transverse colon as noted on yesterday's CT. 3. No biliary ductal dilation. 4. Small RIGHT pleural effusion as noted on yesterday's CT. Electronically Signed   By: Evangeline Dakin M.D.   On: 01/06/2019 16:36   ASSESSMENT AND PLAN:   William Holland  is a 73 y.o. 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  1.  Acute systolic CHF, LV EF: 27% -   35% Hold IV Lasix due to significantly low potassium per cardiology consult.  2.  Elevated troponin-could be demand ischemia due to CHF. -Patient denies any chest pain.  No significant EKG changes.   3.  Perforation of bowel-known history of significant diverticulosis.  Last colonoscopy in March 2019 -Likely diverticular perforation.  Nontoxic-appearing, no signs of peritonitis. Continue Zosyn empirically, keep n.p.o. High risk for surgery, keep n.p.o. with medical treatment first care; give formal bowel prep  Thursday in anticipation of surgery Friday per Dr. Celine Ahr.  4.  Hypokalemia-being replaced.  Potassium is still low at 2.9. -Pharmacy consult for electrolyte management.  Magnesium is normal.  5.  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  Could be liver cirrhosis with elevated bilirubin and AST greater than ALT. -Ultrasound of the liver: Adenomyomatosis involving the gallbladder wall. No evidence of cholelithiasis or cholecystitis.   Hyponatremia.  Improvomg with D5 IV. Generalized weakness.  Home health and PT.  I discussed  with Dr. Rockey Situ and surgical PA.. All the records are reviewed and case discussed with Care Management/Social Worker. Management plans discussed with the patient, family and they are in agreement.  CODE STATUS: Full Code  TOTAL TIME TAKING CARE OF THIS PATIENT: 32 minutes.   More than 50% of the time was spent in counseling/coordination of care: YES  POSSIBLE D/C IN >3 DAYS, DEPENDING ON CLINICAL CONDITION.   Demetrios Loll M.D on 01/07/2019 at 3:13 PM  Between 7am to 6pm - Pager - [phone]  After 6pm go to [email or web address] - [facility]

## 2019-01-07 NOTE — Progress Notes (Signed)
Progress Note  Patient Name: William Holland Date of Encounter: 01/07/2019  Primary Cardiologist:New Dr. Fletcher Anon  Subjective   Patient reported mild fatigue but denied any cardiac complaint of chest pain, shortness of breath, dizziness, or palpitations.  She is unsure if lower extremity is improved or worse when compared with LEE yesterday.  Inpatient Medications    Scheduled Meds: . enoxaparin (LOVENOX) injection  40 mg Subcutaneous Q24H  . [START ON 01/08/2019] magnesium citrate  1 Bottle Oral BID  . [START ON 01/08/2019] metroNIDAZOLE  500 mg Oral Q8H  . [START ON 01/08/2019] neomycin  1,000 mg Oral Q8H  . tamsulosin  0.4 mg Oral Daily   Continuous Infusions: . sodium chloride 10 mL/hr (01/07/19 1208)  . dextrose 50 mL/hr at 01/07/19 1632  . piperacillin-tazobactam (ZOSYN)  IV 3.375 g (01/07/19 1508)  . potassium chloride 10 mEq (01/07/19 1635)   PRN Meds: acetaminophen **OR** acetaminophen, Influenza vac split quadrivalent PF, ondansetron **OR** ondansetron (ZOFRAN) IV, pneumococcal 23 valent vaccine   Vital Signs    Vitals:   01/06/19 2037 01/07/19 0411 01/07/19 0810 01/07/19 1623  BP: (!) 109/58 110/60 (!) 104/52 (!) 99/53  Pulse: 80 86 81 89  Resp: 20 20    Temp: 98 F (36.7 C) (!) 97.4 F (36.3 C) 97.7 F (36.5 C) 98 F (36.7 C)  TempSrc: Oral Oral  Oral  SpO2: 96% 95% 98% 92%  Weight:  77.2 kg    Height:        Intake/Output Summary (Last 24 hours) at 01/07/2019 1710 Last data filed at 01/07/2019 1622 Gross per 24 hour  Intake 719.36 ml  Output 2050 ml  Net -1330.64 ml   Filed Weights   01/05/19 1557 01/06/19 0332 01/07/19 0411  Weight: 82.6 kg 80.9 kg 77.2 kg    Telemetry    NSR, 80s, PACs and PVCs - Personally Reviewed  ECG    No new tracings - Personally Reviewed  Physical Exam   GEN: No acute distress.   Neck: JVP elevated ~8-10 and HJR present Cardiac: RRR, no murmurs, rubs, or gallops.  Respiratory: Clear to auscultation bilaterally. GI:  firm, somewhat-distended  MS: Worsened b/l LEE with 3+ b/l LEE; No deformity. Neuro:  Nonfocal  Psych: Normal affect   Labs    Chemistry Recent Labs  Lab 01/05/19 1320  01/06/19 0421  01/06/19 1749 01/07/19 0204 01/07/19 1026  NA  --   --  150*  --   --  148* 147*  K  --    < > <2.0*   < > 3.0* 3.2* 2.9*  CL  --   --  101  --   --  103 101  CO2  --   --  39*  --   --  35* 33*  GLUCOSE  --   --  72  --   --  68* 80  BUN  --   --  15  --   --  16 16  CREATININE  --   --  1.11  --   --  1.08 1.08  CALCIUM  --   --  6.8*  --   --  7.2* 7.4*  PROT 5.8*  --   --   --   --   --   --   ALBUMIN 2.9*  --   --   --   --   --   --   AST 333*  --   --   --   --   --   --  ALT 120*  --   --   --   --   --   --   ALKPHOS 74  --   --   --   --   --   --   BILITOT 1.4*  --   --   --   --   --   --   GFRNONAA  --   --  >60  --   --  >60 >60  GFRAA  --   --  >60  --   --  >60 >60  ANIONGAP  --   --  10  --   --  10 13   < > = values in this interval not displayed.     Hematology Recent Labs  Lab 01/05/19 1037 01/06/19 0421 01/07/19 0204  WBC 7.8 5.5 9.8  RBC 4.17* 4.13* 4.33  HGB 11.0* 10.5* 11.0*  HCT 35.1* 34.1* 36.3*  MCV 84.2 82.6 83.8  MCH 26.4 25.4* 25.4*  MCHC 31.3 30.8 30.3  RDW 17.2* 17.1* 17.1*  PLT 201 152 172    Cardiac Enzymes Recent Labs  Lab 01/05/19 1320 01/05/19 1637 01/05/19 2159 01/06/19 0421  TROPONINI 0.16* 0.16* 0.16* 0.12*   No results for input(s): TROPIPOC in the last 168 hours.   BNP Recent Labs  Lab 01/05/19 1208  BNP 2,927.0*     DDimer No results for input(s): DDIMER in the last 168 hours.   Radiology    US Abdomen Limited Ruq  Result Date: 01/06/2019 CLINICAL DATA:  Elevated serum liver function test. EXAM: ULTRASOUND ABDOMEN LIMITED RIGHT UPPER QUADRANT COMPARISON:  CT abdomen and pelvis 01/05/2019, 03/10/2016. No prior ultrasound. FINDINGS: Gallbladder: Mild wall thickening up to 6 mm diameter with ring-down (comet tail)  artifact identified arising from the gallbladder wall. No shadowing gallstones or echogenic sludge. Negative sonographic Murphy's sign according to the ultrasound technologist. Common bile duct: Diameter: Approximately 2 mm. Liver: Nonvisualization of the LEFT lobe due to the gas-filled distended transverse colon as noted on yesterday's CT. No focal parenchymal abnormality involving the RIGHT lobe. Normal parenchymal echotexture. Portal vein is patent on color Doppler imaging with normal direction of blood flow towards the liver. Other: Peristalsis is identified within the dilated transverse colon which contains semi-solid stool. Small RIGHT pleural effusion. IMPRESSION: 1. Adenomyomatosis involving the gallbladder wall. No evidence of cholelithiasis or cholecystitis. 2. Normal appearing liver in its visualized portions. The LEFT lobe was obscured by bowel gas from the overlying distended transverse colon as noted on yesterday's CT. 3. No biliary ductal dilation. 4. Small RIGHT pleural effusion as noted on yesterday's CT. Electronically Signed   By: Evangeline Dakin M.D.   On: 01/06/2019 16:36    Cardiac Studies   TTE (01/06/2019): - Left ventricle: The cavity size was at the upper limits of normal. Wall thickness was normal. Systolic function was moderately to severely reduced. The estimated ejection fraction was in the range of 30% to 35%. Diffuse hypokinesis. Regional wall motion abnormalities cannot be excluded. Features are consistent with a pseudonormal left ventricular filling pattern, with concomitant abnormal relaxation and increased filling pressure (grade 2 diastolic dysfunction). Doppler parameters are consistent with high ventricular filling pressure. - Aortic valve: Trileaflet; mildly thickened leaflets. - Mitral valve: Mildly thickened leaflets . There was mild regurgitation. - Left atrium: The atrium was moderately dilated. - Right ventricle: The cavity size was  normal. Systolic function was moderately reduced. - Pericardium, extracardiac: A trivial pericardial effusion was identified. There was a right  pleural effusion.   Patient Profile     73 y.o. male with a history of sigmoid diverticulitis, chronic diarrhea, and intellectual slowing with cardiology evaluation for acute heart failure with assessment of preoperative cardiac risk in setting of bowel perforation.  Assessment & Plan    Combined diastolic and systolic heart failure -LVEF 30 to 35% and G2 DD as above in 1/14 TTE - Lower extremity edema worse on exam, compared with yesterday.  Diuresis has been precluded by hypokalemia.  No reported shortness of breath, however.  With continued hypokalemia, consider starting patient on spironolactone with titration as needed for further BP control and diuresis.  Surgery, patient started on fluids.  Recommend discontinue fluids if possible and per surgery. Continue to monitor volume status with daily weights, I's/O's.  Defer adding BB and ACE/arm/ARN I in the setting of soft SBP.  At this time, defer ischemic evaluation in setting of bowel perforation.   Hypokalemia -S/p diarrhea earlier in the week /GI losses. Considered endocrine/metabolic etiologies.  -Continue potassium repletion with goal 4.0.  With continued hypokalemia, consider nephrology consultation. AM BMET. -Continue to monitor on telemetry for arrhythmia.  Currently SR with ectopy.    Bowel perforation/preop risk assessment -Patient is considered high risk for elective surgery; however, in the setting of bowel perforation, surgery should not be delayed if it can improve the patient's morbility and mortality.  Adequate repletion of potassium would be beneficial to minimize the risk of arrhythmias during and after surgery.  Per surgery, started fluids today.  Recommend that closely monitor volume status in the setting of this drip given known acute combined diastolic and systolic heart  failure with pitting edema and stop fluids if possible.   Anemia -Stable.  Per IM/surgery.   For questions or updates, please contact [place] Please consult [email or web address] for contact info under        Signed, Arvil Chaco, PA-C  01/07/2019, 5:10 PM

## 2019-01-07 NOTE — Consult Note (Signed)
PHARMACY CONSULT NOTE - FOLLOW UP  Pharmacy Consult for Electrolyte Monitoring and Replacement   Recent Labs: Potassium (mmol/L)  Date Value  01/07/2019 3.2 (L)   Magnesium (mg/dL)  Date Value  01/06/2019 1.9   Calcium (mg/dL)  Date Value  01/07/2019 7.2 (L)   Albumin (g/dL)  Date Value  01/05/2019 2.9 (L)   Phosphorus (mg/dL)  Date Value  01/06/2019 3.2   Sodium (mmol/L)  Date Value  01/07/2019 148 (H)     Assessment: 73 y.o. 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 Patient says on an average that he has anywhere between 4-10 stools every day.  CT of the abdomen revealing possible bowel perforation and free air in the abdomen. Patient is cdiff (-), but continues to have diarrhea.  Goal of Therapy:  K ~ 4.0 Mg ~ 2.0 Phos 2.5 - 4.5  Plan:  01/15 @ 0200 K+ 3.2 will replace w/ KCI 40 mEq PO x 2 and will recheck BMP @ 1000.  Tobie Lords, PharmD Clinical Pharmacist 01/07/2019 3:29 AM

## 2019-01-07 NOTE — Evaluation (Signed)
Physical Therapy Evaluation Patient Details Name: William Holland MRN: [id number] DOB: May 29, 1946 Today's Date: 01/07/2019   History of Present Illness  Pt is a 73 year old male admitted with hypokalemia, bowel perforation and pressure ulcers of LE's following family c/o pt experiencing 4 falls in the past week and "swelling" and "weeping" of the LE's.  Pt has a rectal tube in place.  Exploratory laparotomy scheduled for 01/09/2019.  PMH includes sigmoid diverticulosis, recent diagnosis of Crohns and delayed intellectual development since birth.  Clinical Impression  Pt is a 73 year old male who lives with his nephew.  Pt is has a developmental delay and is unable to provide consistent history, though he is capable of conversation.  Pt in bed with rectal tube in place and reporting nausea with movement.  He was able to perform bed mobility mod I and sit at EOB, requesting emesis bag.  Pt presented with dry heaving with no production and was eager to try to stand and walk.  Pt able to stand with close CGA and use of RW.  He ambulated 8 ft in room and transferred chair to bed, appearing unsteady on feet and reporting fatigue.  Pt able to demonstrate proficiency with supine there ex without report of pain.  Pt will continue to benefit from skilled PT with focus on strength, tolerance to activity and fall prevention.  Pt will benefit from home health PT following discharge.    Follow Up Recommendations Home health PT;Supervision - Intermittent    Equipment Recommendations  None recommended by PT;Rolling walker with 5" wheels(Pt did state that he has a RW.  If he does not, pt will need a RW.)    Recommendations for Other Services       Precautions / Restrictions Precautions Precautions: Fall Precaution Comments: 4 falls in the past week Restrictions Weight Bearing Restrictions: No      Mobility  Bed Mobility Overal bed mobility: Needs Assistance Bed Mobility: Sit to Supine       Sit to supine:  Min assist   General bed mobility comments: Increased time needed to get to EOB but pt was able to manage on his own.  Pt requested assistance to bring LE's over EOB when getting back into bed.  Transfers Overall transfer level: Needs assistance Equipment used: Rolling walker (2 wheeled) Transfers: Sit to/from Stand Sit to Stand: Supervision         General transfer comment: Pt more steady with RW when standing from bedside and recliner.  Ambulation/Gait Ambulation/Gait assistance: Min guard Gait Distance (Feet): 8 Feet Assistive device: Rolling walker (2 wheeled)       General Gait Details: pt able to manage RW with min VC's, low foot clearance and decreased step length.  Pt fatigued very quickly.  Stairs            Wheelchair Mobility    Modified Rankin (Stroke Patients Only)       Balance Overall balance assessment: Needs assistance Sitting-balance support: Bilateral upper extremity supported;Feet supported Sitting balance-Leahy Scale: Fair     Standing balance support: Bilateral upper extremity supported Standing balance-Leahy Scale: Fair Standing balance comment: Pt unsteady on feet without RW.  Has a RW at home and willing to use.                             Pertinent Vitals/Pain Pain Assessment: No/denies pain    Home Living Family/patient expects to be discharged to::  Private residence Living Arrangements: Other relatives(Nephew) Available Help at Discharge: Family;Available PRN/intermittently Type of Home: House       Home Layout: One level        Prior Function Level of Independence: Independent         Comments: Pt replied "no" when asked if he uses a cane or walker for ambulation.  Did state that he is mostly walking around in his home and does not leave the house frequently.     Hand Dominance        Extremity/Trunk Assessment   Upper Extremity Assessment Upper Extremity Assessment: Overall WFL for tasks  assessed    Lower Extremity Assessment Lower Extremity Assessment: Overall WFL for tasks assessed(Grossly 4/5 bilaterally.)    Cervical / Trunk Assessment Cervical / Trunk Assessment: Kyphotic  Communication   Communication: Other (comment)(Pt is able to carry on a conversation but is difficult to understand at times.  Does not answer all questions about home setup.)  Cognition Arousal/Alertness: Awake/alert Behavior During Therapy: WFL for tasks assessed/performed Overall Cognitive Status: History of cognitive impairments - at baseline                                 General Comments: Follows commands consistently      General Comments      Exercises General Exercises - Lower Extremity Ankle Circles/Pumps: 10 reps;Strengthening;Supine;Both Quad Sets: Both;Strengthening;10 reps;Supine Heel Slides: Strengthening;Both;10 reps;Supine Hip ABduction/ADduction: Strengthening;Both;10 reps;Supine Straight Leg Raises: Both;5 reps;Supine;Strengthening   Assessment/Plan    PT Assessment Patient needs continued PT services  PT Problem List Decreased mobility;Decreased strength;Decreased activity tolerance       PT Treatment Interventions DME instruction;Functional mobility training;Balance training;Patient/family education;Gait training;Stair training;Therapeutic exercise;Therapeutic activities    PT Goals (Current goals can be found in the Care Plan section)  Acute Rehab PT Goals PT Goal Formulation: Patient unable to participate in goal setting    Frequency Min 2X/week   Barriers to discharge        Co-evaluation               AM-PAC PT "6 Clicks" Mobility  Outcome Measure Help needed turning from your back to your side while in a flat bed without using bedrails?: None Help needed moving from lying on your back to sitting on the side of a flat bed without using bedrails?: A Little Help needed moving to and from a bed to a chair (including a  wheelchair)?: A Little Help needed standing up from a chair using your arms (e.g., wheelchair or bedside chair)?: A Little Help needed to walk in hospital room?: A Little Help needed climbing 3-5 steps with a railing? : A Little 6 Click Score: 19    End of Session Equipment Utilized During Treatment: Gait belt Activity Tolerance: Patient limited by fatigue Patient left: in bed;with call bell/phone within reach;with bed alarm set Nurse Communication: Mobility status PT Visit Diagnosis: Unsteadiness on feet (R26.81);Repeated falls (R29.6);History of falling (Z91.81);Muscle weakness (generalized) (M62.81)    Time: 8811-0315 PT Time Calculation (min) (ACUTE ONLY): 18 min   Charges:   PT Evaluation $PT Eval Moderate Complexity: 1 Mod PT Treatments $Therapeutic Exercise: 8-22 mins        Roxanne Gates, PT, DPT   Roxanne Gates 01/07/2019, 12:31 PM

## 2019-01-08 ENCOUNTER — Encounter: Payer: Self-pay | Admitting: Anesthesiology

## 2019-01-08 DIAGNOSIS — R7989 Other specified abnormal findings of blood chemistry: Secondary | ICD-10-CM

## 2019-01-08 DIAGNOSIS — R350 Frequency of micturition: Secondary | ICD-10-CM

## 2019-01-08 LAB — BASIC METABOLIC PANEL
Anion gap: 9 (ref 5–15)
BUN: 20 mg/dL (ref 8–23)
CO2: 34 mmol/L — ABNORMAL HIGH (ref 22–32)
Calcium: 7.7 mg/dL — ABNORMAL LOW (ref 8.9–10.3)
Chloride: 106 mmol/L (ref 98–111)
Creatinine, Ser: 1.18 mg/dL (ref 0.61–1.24)
GFR calc Af Amer: >60 mL/min (ref >60)
GFR calc non Af Amer: >60 mL/min (ref >60)
Glucose, Bld: 65 mg/dL — ABNORMAL LOW (ref 70–99)
Potassium: 4.1 mmol/L (ref 3.5–5.1)
SODIUM: 149 mmol/L — AB (ref 135–145)

## 2019-01-08 LAB — HEPATIC FUNCTION PANEL
ALT: 73 U/L — ABNORMAL HIGH (ref 0–44)
AST: 83 U/L — ABNORMAL HIGH (ref 15–41)
Albumin: 2.5 g/dL — ABNORMAL LOW (ref 3.5–5.0)
Alkaline Phosphatase: 59 U/L (ref 38–126)
BILIRUBIN INDIRECT: 1.1 mg/dL — AB (ref 0.3–0.9)
Bilirubin, Direct: 0.5 mg/dL — ABNORMAL HIGH (ref 0.0–0.2)
Total Bilirubin: 1.6 mg/dL — ABNORMAL HIGH (ref 0.3–1.2)
Total Protein: 5.2 g/dL — ABNORMAL LOW (ref 6.5–8.1)

## 2019-01-08 LAB — THYROID PEROXIDASE ANTIBODY: Thyroperoxidase Ab SerPl-aCnc: <6 IU/mL (ref 0–34)

## 2019-01-08 LAB — T3, FREE: T3 FREE: 1.3 pg/mL — AB (ref 2.0–4.4)

## 2019-01-08 MED ORDER — LEVOTHYROXINE SODIUM 100 MCG/5ML IV SOLN
25.0000 ug | Freq: Every day | INTRAVENOUS | Status: DC
  Administered 2019-01-08 – 2019-01-10 (×3): 25 ug via INTRAVENOUS
  Filled 2019-01-08 (×4): qty 5

## 2019-01-08 NOTE — Anesthesia Preprocedure Evaluation (Deleted)
Anesthesia Evaluation    Airway        Dental   Pulmonary           Cardiovascular      Neuro/Psych    GI/Hepatic   Endo/Other    Renal/GU      Musculoskeletal   Abdominal   Peds  Hematology   Anesthesia Other Findings Past Medical History: 02/27/2018: Diverticulosis, sigmoid     Comment:  Colonoscopy done on 02/27/2018 by Dr. Marius Ditch No date: Stuttering during school years   Reproductive/Obstetrics                             Anesthesia Physical Anesthesia Plan Anesthesia Quick Evaluation

## 2019-01-08 NOTE — Progress Notes (Addendum)
[facility] SURGICAL PROGRESS NOTE (cpt [phone])  Hospital Day(s): 3.   Interval History: Patient seen and examined, no acute events or new complaints overnight. Patient denies any abdominal pain this morning and is without complaints of nausea or emesis. No fever or chills. Continues to have stool output from his rectal tube. Continued aggressive electrolyte replacement (now entirely IV as he was likely not absorbing much PO) is correcting his electrolyte abnormalities (K+ is 4.1 today). He continues to remain hypernatremic at 149 today. TFTs checked, as he was quite hypothyroid in July and there is no indication that therapy was ever initiated. IV LT4 initiated at 25 mcg daily.   Review of Systems:  Constitutional: denies fever, chills  Respiratory: denies any shortness of breath  Cardiovascular: denies chest pain or palpitations  Gastrointestinal: denied Abdominal pain, nausea, emesis. Diarrhea/and bowel function as per interval history Musculoskeletal: + peripheral edema   Vital signs in last 24 hours: [min-max] current  Temp:  [97.9 F (36.6 C)-98.6 F (37 C)] 97.9 F (36.6 C) (01/16 0829) Pulse Rate:  [79-90] 79 (01/16 0829) Resp:  [18-20] 18 (01/16 0829) BP: (90-100)/(49-53) 100/52 (01/16 0829) SpO2:  [90 %-94 %] 93 % (01/16 0829) Weight:  [76.2 kg] 76.2 kg (01/16 0529)     Height: 5' 9"  (175.3 cm) Weight: 76.2 kg BMI (Calculated): 24.78   Intake/Output this shift:  No intake/output data recorded.   Intake/Output last 2 shifts:  @IOLAST2SHIFTS @   Physical Exam:  Constitutional: alert, cooperative and no distress  HENT: normocephalic without obvious abnormality  Eyes: EOM's grossly intact and symmetric  Respiratory: breathing non-labored at rest  Cardiovascular: regular rate and sinus rhythm  Gastrointestinal: non-tender, distended. No rebound or guarding. Rectal tube present with stool in bag Musculoskeletal: + 2 pitting edema bilateral lower extremities  extending to distal thigh    Labs:  CBC Latest Ref Rng & Units 01/07/2019 01/06/2019 01/05/2019  WBC 4.0 - 10.5 K/uL 9.8 5.5 7.8  Hemoglobin 13.0 - 17.0 g/dL 11.0(L) 10.5(L) 11.0(L)  Hematocrit 39.0 - 52.0 % 36.3(L) 34.1(L) 35.1(L)  Platelets 150 - 400 K/uL 172 152 201   CMP Latest Ref Rng & Units 01/08/2019 01/07/2019 01/07/2019  Glucose 70 - 99 mg/dL 65(L) - 80  BUN 8 - 23 mg/dL 20 - 16  Creatinine 0.61 - 1.24 mg/dL 1.18 - 1.08  Sodium 135 - 145 mmol/L 149(H) - 147(H)  Potassium 3.5 - 5.1 mmol/L 4.1 3.1(L) 2.9(L)  Chloride 98 - 111 mmol/L 106 - 101  CO2 22 - 32 mmol/L 34(H) - 33(H)  Calcium 8.9 - 10.3 mg/dL 7.7(L) - 7.4(L)  Total Protein 6.5 - 8.1 g/dL 5.2(L) - -  Total Bilirubin 0.3 - 1.2 mg/dL 1.6(H) - -  Alkaline Phos 38 - 126 U/L 59 - -  AST 15 - 41 U/L 83(H) - -  ALT 0 - 44 U/L 73(H) - -     Assessment/Plan: (ICD-10's: K63.1) 73 y.o. male who is hemodynamically stable with resolved hypokalemia but still hypernatremic still with significant lower extremity edema most likely secondary to new onset CHF with known (likely chronic) bowel perforation, which is complicated by pertinent comorbidities including chronic diarrhea, intellectual slowing, and lack of previous medical care.   - NPO + conservative IVF resuscitation given CHF - Continue IV ABx (Zosyn), pain control PRN, Antiemetics PRN - Continue to monitor abdominal examination              - Patient is scheduled for exploratory laparotomy and possible subtotal  colectomy with end ileostomy vs Hartmann procedure vs sigmoid colectomy with primary anastomosis. His electrolyte abnormalities are improving with aggressive repletion. Continue to replete/manage with a goal toward K+ of 4, Magnesium of 2, Sodium WNL.               - Will give formal bowel prep today in anticipation of surgery tomorrow             - Appreciate WOC RN consult for pre-operative marking for possible end ileostomy or descending  colostomy.  - Will consult urology for ureteral stent placement for safe intraoperative identification of ureters with anticipation for significant adhesive disease given patient has had multiple episodes of diverticulitis.              - Appreciate cardiology input - Medical management per primary team; agree with treatment of hypothyroidism as this may be a contributing factor to his CHF and potentially exacerbating his intellectual slowing.   All of the above findings and recommendations were discussed with the patient, and the medical team, and all of patient's questions were answered to his expressed satisfaction.   -- William Simon, PA-C  [facility] 01/08/2019, 8:34 AM [id number] M-F: 7am - 4pm  I saw and evaluated the patient.  I agree with the above documentation, exam, and plan, which I have edited where appropriate. William Holland  12:05 PM

## 2019-01-08 NOTE — Consult Note (Signed)
PHARMACY CONSULT NOTE - FOLLOW UP  Pharmacy Consult for Electrolyte Monitoring and Replacement   Recent Labs: Potassium (mmol/L)  Date Value  01/08/2019 4.1   Magnesium (mg/dL)  Date Value  01/07/2019 1.8   Calcium (mg/dL)  Date Value  01/08/2019 7.7 (L)   Albumin (g/dL)  Date Value  01/08/2019 2.5 (L)   Phosphorus (mg/dL)  Date Value  01/06/2019 3.2   Sodium (mmol/L)  Date Value  01/08/2019 149 (H)     Assessment: 73 y.o. 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 Patient says on an average that he has anywhere between 4-10 stools every day.  CT of the abdomen revealing possible bowel perforation and free air in the abdomen. Patient is cdiff (-), but continues to have diarrhea.  Goal of Therapy:  K ~ 4.0 Mg ~ 2.0 Phos 2.5 - 4.5  Plan:  01/16 @ 0500 K+ 4.1 WNL no replacement needed at this time. Will monitor w/ am labs.  Tobie Lords, PharmD Clinical Pharmacist 01/08/2019 5:51 AM

## 2019-01-08 NOTE — Progress Notes (Addendum)
Progress Note  Patient Name: William Holland Date of Encounter: 01/08/2019  Primary Cardiologist: New, Dr. Fletcher Anon  Subjective   Patient continues to deny complaint of chest pain.  Overall low energy but improved LEE.  Inpatient Medications    Scheduled Meds: . enoxaparin (LOVENOX) injection  40 mg Subcutaneous Q24H  . levothyroxine  25 mcg Intravenous Daily  . magnesium citrate  1 Bottle Oral BID  . metroNIDAZOLE  500 mg Oral Q8H  . neomycin  1,000 mg Oral Q8H  . tamsulosin  0.4 mg Oral Daily   Continuous Infusions: . sodium chloride 10 mL/hr (01/07/19 1208)  . dextrose 50 mL/hr at 01/08/19 0450  . piperacillin-tazobactam (ZOSYN)  IV 3.375 g (01/08/19 0455)   PRN Meds: acetaminophen **OR** acetaminophen, Influenza vac split quadrivalent PF, ondansetron **OR** ondansetron (ZOFRAN) IV, pneumococcal 23 valent vaccine   Vital Signs    Vitals:   01/07/19 1930 01/07/19 2023 01/08/19 0529 01/08/19 0829  BP:  (!) 93/49 (!) 90/50 (!) 100/52  Pulse: 90 90 82 79  Resp: 20  20 18   Temp: 98.6 F (37 C)  98 F (36.7 C) 97.9 F (36.6 C)  TempSrc: Oral  Oral Oral  SpO2: 94% 90% 92% 93%  Weight:   76.2 kg   Height:        Intake/Output Summary (Last 24 hours) at 01/08/2019 1434 Last data filed at 01/08/2019 0900 Gross per 24 hour  Intake 1469.36 ml  Output 1400 ml  Net 69.36 ml   Filed Weights   01/06/19 0332 01/07/19 0411 01/08/19 0529  Weight: 80.9 kg 77.2 kg 76.2 kg    Telemetry    NSR, PACs and PVCs - Personally Reviewed  ECG    No new tracings - Personally Reviewed  Physical Exam   GEN: No acute distress.   Cardiac: RRR, no murmurs, rubs, or gallops.  Respiratory: Clear to auscultation bilaterally. GI: somewhat-distended  MS: improved b/l LEE yet still 2-3+ b/l LEE to shins; No deformity. Neuro:  Nonfocal  Psych: Normal affect   Labs    Chemistry Recent Labs  Lab 01/05/19 1320  01/07/19 0204 01/07/19 1026 01/07/19 1747 01/08/19 0450  NA  --    < >  148* 147*  --  149*  K  --    < > 3.2* 2.9* 3.1* 4.1  CL  --    < > 103 101  --  106  CO2  --    < > 35* 33*  --  34*  GLUCOSE  --    < > 68* 80  --  65*  BUN  --    < > 16 16  --  20  CREATININE  --    < > 1.08 1.08  --  1.18  CALCIUM  --    < > 7.2* 7.4*  --  7.7*  PROT 5.8*  --   --   --   --  5.2*  ALBUMIN 2.9*  --   --   --   --  2.5*  AST 333*  --   --   --   --  83*  ALT 120*  --   --   --   --  73*  ALKPHOS 74  --   --   --   --  59  BILITOT 1.4*  --   --   --   --  1.6*  GFRNONAA  --    < > >60 >60  --  >  60  GFRAA  --    < > >60 >60  --  >60  ANIONGAP  --    < > 10 13  --  9   < > = values in this interval not displayed.     Hematology Recent Labs  Lab 01/05/19 1037 01/06/19 0421 01/07/19 0204  WBC 7.8 5.5 9.8  RBC 4.17* 4.13* 4.33  HGB 11.0* 10.5* 11.0*  HCT 35.1* 34.1* 36.3*  MCV 84.2 82.6 83.8  MCH 26.4 25.4* 25.4*  MCHC 31.3 30.8 30.3  RDW 17.2* 17.1* 17.1*  PLT 201 152 172    Cardiac Enzymes Recent Labs  Lab 01/05/19 1320 01/05/19 1637 01/05/19 2159 01/06/19 0421  TROPONINI 0.16* 0.16* 0.16* 0.12*   No results for input(s): TROPIPOC in the last 168 hours.   BNP Recent Labs  Lab 01/05/19 1208  BNP 2,927.0*     DDimer No results for input(s): DDIMER in the last 168 hours.   Radiology    US Abdomen Limited Ruq  Result Date: 01/06/2019 CLINICAL DATA:  Elevated serum liver function test. EXAM: ULTRASOUND ABDOMEN LIMITED RIGHT UPPER QUADRANT COMPARISON:  CT abdomen and pelvis 01/05/2019, 03/10/2016. No prior ultrasound. FINDINGS: Gallbladder: Mild wall thickening up to 6 mm diameter with ring-down (comet tail) artifact identified arising from the gallbladder wall. No shadowing gallstones or echogenic sludge. Negative sonographic Murphy's sign according to the ultrasound technologist. Common bile duct: Diameter: Approximately 2 mm. Liver: Nonvisualization of the LEFT lobe due to the gas-filled distended transverse colon as noted on yesterday's CT. No  focal parenchymal abnormality involving the RIGHT lobe. Normal parenchymal echotexture. Portal vein is patent on color Doppler imaging with normal direction of blood flow towards the liver. Other: Peristalsis is identified within the dilated transverse colon which contains semi-solid stool. Small RIGHT pleural effusion. IMPRESSION: 1. Adenomyomatosis involving the gallbladder wall. No evidence of cholelithiasis or cholecystitis. 2. Normal appearing liver in its visualized portions. The LEFT lobe was obscured by bowel gas from the overlying distended transverse colon as noted on yesterday's CT. 3. No biliary ductal dilation. 4. Small RIGHT pleural effusion as noted on yesterday's CT. Electronically Signed   By: Evangeline Dakin M.D.   On: 01/06/2019 16:36    Cardiac Studies   TTE (01/06/2019): - Left ventricle: The cavity size was at the upper limits of normal. Wall thickness was normal. Systolic function was moderately to severely reduced. The estimated ejection fraction was in the range of 30% to 35%. Diffuse hypokinesis. Regional wall motion abnormalities cannot be excluded. Features are consistent with a pseudonormal left ventricular filling pattern, with concomitant abnormal relaxation and increased filling pressure (grade 2 diastolic dysfunction). Doppler parameters are consistent with high ventricular filling pressure. - Aortic valve: Trileaflet; mildly thickened leaflets. - Mitral valve: Mildly thickened leaflets . There was mild regurgitation. - Left atrium: The atrium was moderately dilated. - Right ventricle: The cavity size was normal. Systolic function was moderately reduced. - Pericardium, extracardiac: A trivial pericardial effusion was identified. There was a right pleural effusion.   Patient Profile     73 y.o. male with a history of sigmoid diverticulitis, chronic diarrhea, and intellectual slowing with cardiology evaluation for acute heart failure  with assessment of preoperative cardiac risk in setting of bowel perforation.  Assessment & Plan    Combined diastolic and systolic heart failure -LVEF 30 to 35% and diffuse hypokinesis, G2DD as above in 1/14 TTE. Pleural effusion. -Lower extremity edema improved. Diuresis previously precluded by hypokalemia, which is now  resolved. However, patient started on conservative fluids in preparation for surgery. Overnight Cr 1.08  1.18. At this time, given upcoming surgery and Cr today, continue to monitor renal function and electrolytes. Will defer diuresis at this time. Continue to monitor volume status with daily weights, I's/O's. 1/16 BP soft at 100/52. Continue to hold BB and ACE/ARB/ARNI in the setting of soft SBP.  At this time, defer ischemic evaluation in setting of bowel perforation.   Hypokalemia, resolved - Likely 2/2 chronic diarrhea earlier in the week /GI losses. S/p potassium repletion K=4.1. AM BMET. Continue to monitor and also watch on telemetry.  Bowel perforation/preop risk assessment -Free air on CT - scheduled for surgery.  --Patient is considered high risk for elective surgery; however, in the setting of bowel perforation, surgery should not be delayed if it can improve the patient's morbility and mortality.  Continue to monitor electrolytes.  Per surgery, started fluids.  Recommend that closely monitor volume status with known acute combined diastolic and systolic heart failure.    Anemia -Stable.  Per IM/surgery. Continue to monitor.   For questions or updates, please contact [place] Please consult [email or web address] for contact info under        Signed, Arvil Chaco, PA-C  01/08/2019, 2:34 PM

## 2019-01-08 NOTE — Progress Notes (Signed)
[place] [facility] [place] NAME: William Holland    MR#:  [id number]  DATE OF BIRTH:  [birth year]  SUBJECTIVE:  CHIEF COMPLAINT:   Chief Complaint  Patient presents with  . Leg Swelling  . Weakness   -Still having profuse diarrhea.  Potassium is better.  Denies any abdominal pain.  Has 2+ lower extremity edema  REVIEW OF SYSTEMS:  Review of Systems  Constitutional: Positive for malaise/fatigue. Negative for chills and fever.  HENT: Negative for congestion, ear discharge, hearing loss and nosebleeds.   Eyes: Negative for blurred vision and double vision.  Respiratory: Negative for cough, shortness of breath and wheezing.   Cardiovascular: Positive for leg swelling. Negative for chest pain and palpitations.  Gastrointestinal: Positive for diarrhea. Negative for abdominal pain, constipation, nausea and vomiting.  Genitourinary: Negative for dysuria.  Musculoskeletal: Negative for myalgias.  Neurological: Negative for dizziness, focal weakness, seizures and headaches.  Psychiatric/Behavioral: Negative for depression.    DRUG ALLERGIES:  No Known Allergies  VITALS:  Blood pressure (!) 100/52, pulse 79, temperature 97.9 F (36.6 C), temperature source Oral, resp. rate 18, height 5' 9"  (2.751 m), weight 76.2 kg, SpO2 93 %.  PHYSICAL EXAMINATION:  Physical Exam  GENERAL:  73 y.o.-year-old patient lying in the bed with no acute distress.  EYES: Pupils equal, round, reactive to light and accommodation. No scleral icterus. Extraocular muscles intact.  HEENT: Head atraumatic, normocephalic. Oropharynx and nasopharynx clear.  NECK:  Supple, no jugular venous distention. No thyroid enlargement, no tenderness.  LUNGS: Normal breath sounds bilaterally, no wheezing, rales,rhonchi or crepitation. No use of accessory muscles of respiration.  Decreased bibasilar breath sounds CARDIOVASCULAR: S1, S2 normal. No  rubs, or gallops.  2/6 systolic murmur is  present ABDOMEN: Abdomen is distended but nontender, hypoactive bowel sounds present. No organomegaly or mass.  EXTREMITIES: No  cyanosis, or clubbing.  2+ lower extremity edema noted NEUROLOGIC: Cranial nerves II through XII are intact. Muscle strength 5/5 in all extremities. Sensation intact. Gait not checked.  PSYCHIATRIC: The patient is alert and oriented x 3. Slow stuttered speech at times. Slow intellectually per family. SKIN: No obvious rash, lesion, or ulcer.    LABORATORY PANEL:   CBC Recent Labs  Lab 01/07/19 0204  WBC 9.8  HGB 11.0*  HCT 36.3*  PLT 172   ------------------------------------------------------------------------------------------------------------------  Chemistries  Recent Labs  Lab 01/07/19 1747 01/08/19 0450  NA  --  149*  K 3.1* 4.1  CL  --  106  CO2  --  34*  GLUCOSE  --  65*  BUN  --  20  CREATININE  --  1.18  CALCIUM  --  7.7*  MG 1.8  --   AST  --  83*  ALT  --  73*  ALKPHOS  --  59  BILITOT  --  1.6*   ------------------------------------------------------------------------------------------------------------------  Cardiac Enzymes Recent Labs  Lab 01/06/19 0421  TROPONINI 0.12*   ------------------------------------------------------------------------------------------------------------------  RADIOLOGY:  US Abdomen Limited Ruq  Result Date: 01/06/2019 CLINICAL DATA:  Elevated serum liver function test. EXAM: ULTRASOUND ABDOMEN LIMITED RIGHT UPPER QUADRANT COMPARISON:  CT abdomen and pelvis 01/05/2019, 03/10/2016. No prior ultrasound. FINDINGS: Gallbladder: Mild wall thickening up to 6 mm diameter with ring-down (comet tail) artifact identified arising from the gallbladder wall. No shadowing gallstones or echogenic sludge. Negative sonographic Murphy's sign according to the ultrasound technologist. Common bile duct: Diameter: Approximately 2 mm. Liver: Nonvisualization of the LEFT lobe due to the  gas-filled distended transverse  colon as noted on yesterday's CT. No focal parenchymal abnormality involving the RIGHT lobe. Normal parenchymal echotexture. Portal vein is patent on color Doppler imaging with normal direction of blood flow towards the liver. Other: Peristalsis is identified within the dilated transverse colon which contains semi-solid stool. Small RIGHT pleural effusion. IMPRESSION: 1. Adenomyomatosis involving the gallbladder wall. No evidence of cholelithiasis or cholecystitis. 2. Normal appearing liver in its visualized portions. The LEFT lobe was obscured by bowel gas from the overlying distended transverse colon as noted on yesterday's CT. 3. No biliary ductal dilation. 4. Small RIGHT pleural effusion as noted on yesterday's CT. Electronically Signed   By: Evangeline Dakin M.D.   On: 01/06/2019 16:36    EKG:   Orders placed or performed during the hospital encounter of 01/05/19  . ED EKG within 10 minutes  . ED EKG within 10 minutes  . EKG 12-Lead  . EKG 12-Lead    ASSESSMENT AND PLAN:   William Holland  is a 73 y.o. 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  1.  Acute CHF exacerbation-no prior history of CHF-no known history of CHF. -Echocardiogram with EF of 30 to 35% and diffuse hypokinesis.  Will need cardiac catheterization when stable to exclude ischemic cardiomyopathy -Due to n.p.o. status and low electrolytes, Lasix is currently on hold. -Appreciate cardiology input.  Not a great candidate for surgery. - Strict intake and output monitoring - daily weights -Appreciate cardiology consult  2.  Perforation of bowel-known history of significant diverticulosis.  Last colonoscopy in March 2019 -Likely diverticular perforation.  Nontoxic-appearing, no signs of peritonitis  -Appreciate surgical  consult.  Significant diarrhea present as well. -Will need partial colectomy.  Continue antibiotics at this time. -If cardiac wise stable, plan for surgery tomorrow.  Will get prep today  3.  Elevated troponin-stable.  No heparin drip at this time.  Appreciate cardiology input.  Diffuse hypokinesis on echocardiogram.  Patient denies any chest pain -We will need cardiac cath as outpatient  4.  Hypokalemia-being replaced aggressively especially with GI losses. -Pharmacy consult for electrolyte management  5.  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   -Improving LFTs. -Liver looks all right on ultrasound.  6.  Hypothyroidism-replace IV Synthroid at this time  7.  DVT prophylaxis-on Lovenox     All the records are reviewed and case discussed with Care Management/Social Workerr. Management plans discussed with the patient, family and they are in agreement.  CODE STATUS: Full code  TOTAL TIME TAKING CARE OF THIS PATIENT: 38 minutes.   POSSIBLE D/C IN 2-3 DAYS, DEPENDING ON CLINICAL CONDITION.   Gladstone Lighter M.D on 01/08/2019 at 8:42 AM  Between 7am to 6pm - Pager - [phone]  After 6pm go to [email or web address] - password [email or web address] [facility]  Office  [phone]  CC: Primary care physician; Novella Rob, FNP

## 2019-01-08 NOTE — Consult Note (Addendum)
Pharmacy Antibiotic Note  William Holland is a 73 y.o. male admitted on 01/05/2019 with intra-abdominal infection.  Patient has a perforation of bowel with a known history of diverticulosis. Surgery is planned today. Pharmacy has been consulted for Zosyn dosing.  Plan: Continue Zosyn 3.375g IV q8h (4 hour infusion).   Height: 5' 9"  (175.3 cm) Weight: 167 lb 14.4 oz (76.2 kg) IBW/kg (Calculated) : 70.7  Temp (24hrs), Avg:98.1 F (36.7 C), Min:97.7 F (36.5 C), Max:98.6 F (37 C)  Recent Labs  Lab 01/05/19 1037 01/05/19 1421 01/05/19 1637 01/06/19 0421 01/07/19 0204 01/07/19 1026 01/08/19 0450  WBC 7.8  --   --  5.5 9.8  --   --   CREATININE 1.30*  --   --  1.11 1.08 1.08 1.18  LATICACIDVEN  --  1.0 0.9  --   --   --   --     Estimated Creatinine Clearance: 57.4 mL/min (by C-G formula based on SCr of 1.18 mg/dL).    No Known Allergies  Antimicrobials this admission: Zosyn 1/13 >>   Dose adjustments this admission: N/A  Microbiology results: 1/13 BCx: NGTD 1/13 UCx:  NG 1/13 GI PCR NR 1/13 Cdiff (-)   Thank you for allowing pharmacy to be a part of this patient's care.   Paticia Stack, PharmD Pharmacy Resident  01/08/2019 7:22 AM

## 2019-01-08 NOTE — Consult Note (Addendum)
6:12 PM   William Holland [birth year] [id number]  CC: Request for access catheters pre-op  HPI: I saw William Holland in consultation for placement of preop bilateral access catheters prior to exploratory laparotomy and diversion by Dr. Celine Ahr.  He is a very frail-appearing and comorbid 73 year old male admitted with multiple falls and diarrhea, found to have hypokalemia, significantly dilated bowel and free air/bowel perforation likely secondary to severe diverticulitis, and likely new diagnosis of congestive heart failure.  He is relatively asymptomatic without abdominal pain or fever.  He does not get regular medical care.  He denies any urinary symptoms.  However, on further questioning it sounds like he voids every hour during the day, with occasional leakage worrisome for overflow incontinence.  This would be consistent with his CT findings of multiple bladder stones.  Regarding pneumaturia, he thinks he may occasionally have air in his urine.  He denies gross hematuria, urinary tract infections, or flank pain.  He is on Flomax at baseline.   PMH: Past Medical History:  Diagnosis Date  . Diverticulosis, sigmoid 02/27/2018   Colonoscopy done on 02/27/2018 by Dr. Marius Ditch  . Stuttering during school years     Surgical History: Past Surgical History:  Procedure Laterality Date  . APPENDECTOMY    . COLONOSCOPY WITH PROPOFOL N/A 02/27/2018   Procedure: COLONOSCOPY WITH PROPOFOL;  Surgeon: Lin Landsman, MD;  Location: [facility] ENDOSCOPY;  Service: Gastroenterology;  Laterality: N/A;  . EYE SURGERY     Allergies: No Known Allergies  Family History: Family History  Problem Relation Age of Onset  . Cancer Brother   . Cancer Father   . Uterine cancer Mother     Social History:  reports that he has never smoked. He has never used smokeless tobacco. He reports that he does not drink alcohol or use drugs.  ROS: Please see flowsheet from today's date for complete review of systems.  Physical  Exam: BP 105/60 (BP Location: Left Arm)   Pulse 83   Temp 98.4 F (36.9 C)   Resp 18   Ht _0  (1.753 m)   Wt 76.2 kg   SpO2 92%   BMI 24.79 kg/m    Constitutional: Chronically ill-appearing Cardiovascular: Regular rate and rhythm Respiratory: There to auscultation bilaterally GI: Abdomen is distended, tympanitic GU: No CVA tenderness Lymph: No cervical or inguinal lymphadenopathy. Skin: No rashes, bruises or suspicious lesions. Neurologic: Grossly intact, no focal deficits, moving all 4 extremities. Psychiatric: Delayed  Laboratory Data: Urine culture 1/13 with multiple bacterial morphotypes, none predominant Sodium 149 Potassium 4.1 Creatinine 1.18, eGFR greater than 60 WBC 9.8 Platelets 172  Pertinent Imaging: I have personally reviewed the CT abdomen pelvis with contrast dated 01/05/2019.   -Free air in the abdomen consistent with bowel perforation, possibly in the mid sigmoid region. -Air in the bladder and in the left renal collecting system with multiple large stones in the bladder.  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  He is going to the OR with general surgery tomorrow for exploratory laparotomy, bowel resection, and likely diversion.  The urology service has been asked to place preoperative ureteral access catheters to aid in intraoperative identification.  I discussed my concerns with Dr. Celine Ahr of general surgery that his  imaging findings are worrisome for possible enterovesical fistula with a large anterior bladder diverticula.  He also has numerous large stones in the bladder.  I discussed these findings at length with the patient.  I sense he has a very poor understanding of his  medical condition.  We discussed the risks of access catheters at length including bleeding/hematuria, infection, AKI, and ureteral injury.  We also discussed at length the possibility of bladder involvement which would necessitate possible repair and Foley catheter placement for around 2 weeks for healing.  If we did open the bladder, we would remove his stones at that time.  We discussed the risk of bleeding, infection, urine leak, and need for prolonged catheter drainage.  -Urology will be available for access catheter placement tomorrow, and possible cystorrhaphy if needed. -Discussed case with OR staff, sliding bed and fluoroscopy will be available for access catheter placement -Case discussed with Dr. Celine Ahr of general surgery  ADDENDUM: Surgery for ex-lap canceled Friday as pre-op risks felt to outweigh benefits secondary to his co-morbidities and overall poor functional status. On repeat imaging free air stable and he continues to be HDS with benign abdominal exam. He continues to void spontaneously. Numerous bladder stones on CT, and possible enterovesical fistula, though no clear evidence on CT w/oral contrast. Agree with conservative management in this co-morbid patient with very poor functional status.  I have arranged follow up with me in 4 weeks for symptom check and PVR, continue flomax.  Billey Co, [facility] [address] [place], [place] [address] [phone]

## 2019-01-08 NOTE — Care Management Important Message (Signed)
Copy of signed Medicare IM left with patient in room. 

## 2019-01-09 ENCOUNTER — Telehealth: Payer: Self-pay | Admitting: General Surgery

## 2019-01-09 ENCOUNTER — Telehealth: Payer: Self-pay | Admitting: Urology

## 2019-01-09 ENCOUNTER — Encounter
Admission: EM | Disposition: A | Discharge status: Home Health | Payer: Self-pay | Source: Home / Self Care | Attending: Internal Medicine

## 2019-01-09 ENCOUNTER — Inpatient Hospital Stay: Payer: Medicare Other

## 2019-01-09 LAB — BASIC METABOLIC PANEL
Anion gap: 5 (ref 5–15)
BUN: 23 mg/dL (ref 8–23)
CALCIUM: 7.9 mg/dL — AB (ref 8.9–10.3)
CHLORIDE: 106 mmol/L (ref 98–111)
CO2: 35 mmol/L — AB (ref 22–32)
CREATININE: 1.23 mg/dL (ref 0.61–1.24)
GFR calc Af Amer: >60 mL/min (ref >60)
GFR calc non Af Amer: 59 mL/min — ABNORMAL LOW (ref >60)
Glucose, Bld: 89 mg/dL (ref 70–99)
Potassium: 3 mmol/L — ABNORMAL LOW (ref 3.5–5.1)
Sodium: 146 mmol/L — ABNORMAL HIGH (ref 135–145)

## 2019-01-09 LAB — POTASSIUM: Potassium: 3.4 mmol/L — ABNORMAL LOW (ref 3.5–5.1)

## 2019-01-09 SURGERY — LAPAROTOMY, EXPLORATORY
Anesthesia: General

## 2019-01-09 SURGERY — LAPAROTOMY, EXPLORATORY
Anesthesia: Choice

## 2019-01-09 MED ORDER — POTASSIUM CHLORIDE 10 MEQ/100ML IV SOLN
10.0000 meq | INTRAVENOUS | Status: AC
  Administered 2019-01-09 (×3): 10 meq via INTRAVENOUS
  Filled 2019-01-09 (×4): qty 100

## 2019-01-09 MED ORDER — PROPOFOL 10 MG/ML IV BOLUS
INTRAVENOUS | Status: AC
  Filled 2019-01-09: qty 20

## 2019-01-09 MED ORDER — IOPAMIDOL (ISOVUE-300) INJECTION 61%
15.0000 mL | INTRAVENOUS | Status: AC
  Administered 2019-01-09 (×2): 15 mL via ORAL

## 2019-01-09 MED ORDER — IOHEXOL 300 MG/ML  SOLN
100.0000 mL | Freq: Once | INTRAMUSCULAR | Status: AC | PRN
  Administered 2019-01-09: 100 mL via INTRAVENOUS

## 2019-01-09 MED ORDER — POTASSIUM CHLORIDE 10 MEQ/100ML IV SOLN
10.0000 meq | INTRAVENOUS | Status: AC
  Administered 2019-01-09 (×4): 10 meq via INTRAVENOUS
  Filled 2019-01-09 (×4): qty 100

## 2019-01-09 MED ORDER — FENTANYL CITRATE (PF) 250 MCG/5ML IJ SOLN
INTRAMUSCULAR | Status: AC
  Filled 2019-01-09: qty 5

## 2019-01-09 SURGICAL SUPPLY — 51 items
CANISTER SUCT 1200ML W/VALVE (MISCELLANEOUS) ×2 IMPLANT
CHLORAPREP W/TINT 26ML (MISCELLANEOUS) ×2 IMPLANT
COVER WAND RF STERILE (DRAPES) ×2 IMPLANT
DRAPE LAPAROTOMY 100X77 ABD (DRAPES) ×2 IMPLANT
DRAPE UTILITY 15X26 TOWEL STRL (DRAPES) ×2 IMPLANT
DRSG OPSITE POSTOP 4X10 (GAUZE/BANDAGES/DRESSINGS) ×2 IMPLANT
DRSG OPSITE POSTOP 4X8 (GAUZE/BANDAGES/DRESSINGS) ×2 IMPLANT
ELECT BLADE 6.5 EXT (BLADE) ×2 IMPLANT
ELECT CAUTERY BLADE TIP 2.5 (TIP) ×2
ELECT EZSTD 165MM 6.5IN (MISCELLANEOUS) ×2
ELECT REM PT RETURN 9FT ADLT (ELECTROSURGICAL) ×2
ELECTRODE CAUTERY BLDE TIP 2.5 (TIP) ×1 IMPLANT
ELECTRODE EZSTD 165MM 6.5IN (MISCELLANEOUS) ×1 IMPLANT
ELECTRODE REM PT RTRN 9FT ADLT (ELECTROSURGICAL) ×1 IMPLANT
GAUZE SPONGE 4X4 12PLY STRL (GAUZE/BANDAGES/DRESSINGS) ×2 IMPLANT
GLOVE BIO SURGEON STRL SZ 6.5 (GLOVE) ×2 IMPLANT
GLOVE BIOGEL PI IND STRL 7.0 (GLOVE) ×2 IMPLANT
GLOVE BIOGEL PI INDICATOR 7.0 (GLOVE) ×2
GOWN STRL REUS W/ TWL LRG LVL3 (GOWN DISPOSABLE) ×4 IMPLANT
GOWN STRL REUS W/TWL LRG LVL3 (GOWN DISPOSABLE) ×4
KIT TURNOVER KIT A (KITS) ×2 IMPLANT
LABEL OR SOLS (LABEL) ×2 IMPLANT
LIGASURE IMPACT 36 18CM CVD LR (INSTRUMENTS) ×2 IMPLANT
NEEDLE HYPO 22GX1.5 SAFETY (NEEDLE) ×2 IMPLANT
NS IRRIG 1000ML POUR BTL (IV SOLUTION) ×2 IMPLANT
PACK BASIN MAJOR ARMC (MISCELLANEOUS) ×2 IMPLANT
PACK COLON CLEAN CLOSURE (MISCELLANEOUS) ×2 IMPLANT
RELOAD PROXIMATE 75MM BLUE (ENDOMECHANICALS) IMPLANT
SPONGE LAP 18X18 RF (DISPOSABLE) ×4 IMPLANT
STAPLER CUT CVD 40MM BLUE (STAPLE) ×2 IMPLANT
STAPLER PROXIMATE 75MM BLUE (STAPLE) IMPLANT
STAPLER SKIN PROX 35W (STAPLE) ×2 IMPLANT
SUT PDS AB 0 CT1 27 (SUTURE) ×2 IMPLANT
SUT PDS AB 1 TP1 54 (SUTURE) ×4 IMPLANT
SUT PROLENE 0 CT 1 30 (SUTURE) ×2 IMPLANT
SUT SILK 2 0 (SUTURE) ×2
SUT SILK 2 0SH CR/8 30 (SUTURE) ×2 IMPLANT
SUT SILK 2-0 18XBRD TIE 12 (SUTURE) ×1 IMPLANT
SUT SILK 2-0 30XBRD TIE 12 (SUTURE) ×1 IMPLANT
SUT SILK 3 0 (SUTURE) ×1
SUT SILK 3-0 (SUTURE) ×2 IMPLANT
SUT SILK 3-0 18XBRD TIE 12 (SUTURE) ×1 IMPLANT
SUT VIC AB 2-0 BRD 54 (SUTURE) ×2 IMPLANT
SUT VIC AB 2-0 CT1 27 (SUTURE) ×1
SUT VIC AB 2-0 CT1 TAPERPNT 27 (SUTURE) ×1 IMPLANT
SUT VIC AB 3-0 54X BRD REEL (SUTURE) ×1 IMPLANT
SUT VIC AB 3-0 BRD 54 (SUTURE) ×1
SUT VIC AB 3-0 SH 27 (SUTURE) ×2
SUT VIC AB 3-0 SH 27X BRD (SUTURE) ×2 IMPLANT
SYR 20CC LL (SYRINGE) ×2 IMPLANT
TRAY FOLEY MTR SLVR 16FR STAT (SET/KITS/TRAYS/PACK) ×2 IMPLANT

## 2019-01-09 SURGICAL SUPPLY — 72 items
BAG DRAIN CYSTO-URO LG1000N (MISCELLANEOUS) ×3 IMPLANT
BRUSH SCRUB EZ  4% CHG (MISCELLANEOUS)
BRUSH SCRUB EZ 4% CHG (MISCELLANEOUS) IMPLANT
CANISTER SUCT 1200ML W/VALVE (MISCELLANEOUS) ×3 IMPLANT
CATH URETL 5X70 OPEN END (CATHETERS) ×3 IMPLANT
CHLORAPREP W/TINT 26ML (MISCELLANEOUS) ×3 IMPLANT
CONRAY 43 FOR UROLOGY 50M (MISCELLANEOUS) ×3 IMPLANT
COVER WAND RF STERILE (DRAPES) ×3 IMPLANT
DRAPE LAPAROTOMY 100X77 ABD (DRAPES) ×3 IMPLANT
DRAPE UTILITY 15X26 TOWEL STRL (DRAPES) ×3 IMPLANT
DRSG OPSITE POSTOP 4X10 (GAUZE/BANDAGES/DRESSINGS) ×3 IMPLANT
DRSG OPSITE POSTOP 4X8 (GAUZE/BANDAGES/DRESSINGS) ×3 IMPLANT
ELECT BLADE 6.5 EXT (BLADE) ×3 IMPLANT
ELECT CAUTERY BLADE TIP 2.5 (TIP) ×3
ELECT EZSTD 165MM 6.5IN (MISCELLANEOUS) ×3
ELECT REM PT RETURN 9FT ADLT (ELECTROSURGICAL) ×3
ELECTRODE CAUTERY BLDE TIP 2.5 (TIP) ×2 IMPLANT
ELECTRODE EZSTD 165MM 6.5IN (MISCELLANEOUS) ×2 IMPLANT
ELECTRODE REM PT RTRN 9FT ADLT (ELECTROSURGICAL) ×2 IMPLANT
GAUZE SPONGE 4X4 12PLY STRL (GAUZE/BANDAGES/DRESSINGS) ×3 IMPLANT
GLOVE BIO SURGEON STRL SZ 6.5 (GLOVE) ×3 IMPLANT
GLOVE BIOGEL PI IND STRL 7.0 (GLOVE) ×4 IMPLANT
GLOVE BIOGEL PI IND STRL 7.5 (GLOVE) ×2 IMPLANT
GLOVE BIOGEL PI INDICATOR 7.0 (GLOVE) ×2
GLOVE BIOGEL PI INDICATOR 7.5 (GLOVE) ×1
GOWN STRL REUS W/ TWL LRG LVL3 (GOWN DISPOSABLE) ×10 IMPLANT
GOWN STRL REUS W/ TWL XL LVL3 (GOWN DISPOSABLE) ×2 IMPLANT
GOWN STRL REUS W/TWL LRG LVL3 (GOWN DISPOSABLE) ×5
GOWN STRL REUS W/TWL XL LVL3 (GOWN DISPOSABLE) ×1
KIT TURNOVER CYSTO (KITS) ×3 IMPLANT
KIT TURNOVER KIT A (KITS) ×3 IMPLANT
LABEL OR SOLS (LABEL) ×3 IMPLANT
LIGASURE IMPACT 36 18CM CVD LR (INSTRUMENTS) ×3 IMPLANT
NEEDLE HYPO 22GX1.5 SAFETY (NEEDLE) ×3 IMPLANT
NS IRRIG 1000ML POUR BTL (IV SOLUTION) ×3 IMPLANT
PACK BASIN MAJOR ARMC (MISCELLANEOUS) ×3 IMPLANT
PACK COLON CLEAN CLOSURE (MISCELLANEOUS) ×3 IMPLANT
PACK CYSTO AR (MISCELLANEOUS) ×3 IMPLANT
RELOAD PROXIMATE 75MM BLUE (ENDOMECHANICALS) IMPLANT
SENSORWIRE 0.038 NOT ANGLED (WIRE) ×3
SET CYSTO W/LG BORE CLAMP LF (SET/KITS/TRAYS/PACK) ×3 IMPLANT
SOL .9 NS 3000ML IRR  AL (IV SOLUTION) ×1
SOL .9 NS 3000ML IRR UROMATIC (IV SOLUTION) ×2 IMPLANT
SPONGE LAP 18X18 RF (DISPOSABLE) ×6 IMPLANT
STAPLER CUT CVD 40MM BLUE (STAPLE) ×3 IMPLANT
STAPLER PROXIMATE 75MM BLUE (STAPLE) IMPLANT
STAPLER SKIN PROX 35W (STAPLE) ×3 IMPLANT
STENT URET 6FRX24 CONTOUR (STENTS) IMPLANT
STENT URET 6FRX26 CONTOUR (STENTS) IMPLANT
SURGILUBE 2OZ TUBE FLIPTOP (MISCELLANEOUS) ×3 IMPLANT
SUT PDS AB 0 CT1 27 (SUTURE) ×3 IMPLANT
SUT PDS AB 1 TP1 54 (SUTURE) ×6 IMPLANT
SUT PROLENE 0 CT 1 30 (SUTURE) ×3 IMPLANT
SUT SILK 2 0 (SUTURE) ×2
SUT SILK 2 0SH CR/8 30 (SUTURE) ×3 IMPLANT
SUT SILK 2-0 18XBRD TIE 12 (SUTURE) ×2 IMPLANT
SUT SILK 2-0 30XBRD TIE 12 (SUTURE) ×2 IMPLANT
SUT SILK 3 0 (SUTURE) ×1
SUT SILK 3-0 (SUTURE) ×3 IMPLANT
SUT SILK 3-0 18XBRD TIE 12 (SUTURE) ×2 IMPLANT
SUT VIC AB 2-0 BRD 54 (SUTURE) ×3 IMPLANT
SUT VIC AB 2-0 CT1 27 (SUTURE) ×1
SUT VIC AB 2-0 CT1 TAPERPNT 27 (SUTURE) ×2 IMPLANT
SUT VIC AB 3-0 54X BRD REEL (SUTURE) ×2 IMPLANT
SUT VIC AB 3-0 BRD 54 (SUTURE) ×1
SUT VIC AB 3-0 SH 27 (SUTURE) ×2
SUT VIC AB 3-0 SH 27X BRD (SUTURE) ×4 IMPLANT
SYR 20CC LL (SYRINGE) ×3 IMPLANT
SYRINGE IRR TOOMEY STRL 70CC (SYRINGE) IMPLANT
TRAY FOLEY MTR SLVR 16FR STAT (SET/KITS/TRAYS/PACK) ×3 IMPLANT
WATER STERILE IRR 1000ML POUR (IV SOLUTION) ×3 IMPLANT
WIRE SENSOR 0.038 NOT ANGLED (WIRE) ×2 IMPLANT

## 2019-01-09 NOTE — Consult Note (Signed)
PHARMACY CONSULT NOTE - FOLLOW UP  Pharmacy Consult for Electrolyte Monitoring and Replacement   Recent Labs: Potassium (mmol/L)  Date Value  01/09/2019 3.0 (L)   Magnesium (mg/dL)  Date Value  01/07/2019 1.8   Calcium (mg/dL)  Date Value  01/09/2019 7.9 (L)   Albumin (g/dL)  Date Value  01/08/2019 2.5 (L)   Phosphorus (mg/dL)  Date Value  01/06/2019 3.2   Sodium (mmol/L)  Date Value  01/09/2019 146 (H)     Assessment: 73 y.o. 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 Patient says on an average that he has anywhere between 4-10 stools every day.  CT of the abdomen revealing possible bowel perforation and free air in the abdomen. Patient is cdiff (-), but continues to have diarrhea.  Goal of Therapy:  K ~ 4.0 Mg ~ 2.0 Phos 2.5 - 4.5  Plan:  01/16 @ 000 K+ 3.0. Ordered KCl 10 mEq IV x 4. Patient possibly having surgery today, will follow-up @ 1400.  Pharmacy will continue to monitor.   Paticia Stack, PharmD Pharmacy Resident  01/09/2019 7:10 AM

## 2019-01-09 NOTE — Progress Notes (Addendum)
Pt only alert to self and place and unable to sign consent form. Notified incoming nurse. Will continue to monitor.

## 2019-01-09 NOTE — Consult Note (Signed)
PHARMACY CONSULT NOTE - FOLLOW UP  Pharmacy Consult for Electrolyte Monitoring and Replacement   Recent Labs: Potassium (mmol/L)  Date Value  01/09/2019 3.4 (L)   Magnesium (mg/dL)  Date Value  01/07/2019 1.8   Calcium (mg/dL)  Date Value  01/09/2019 7.9 (L)   Albumin (g/dL)  Date Value  01/08/2019 2.5 (L)   Phosphorus (mg/dL)  Date Value  01/06/2019 3.2   Sodium (mmol/L)  Date Value  01/09/2019 146 (H)     Assessment: 73 y.o. 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 Patient says on an average that he has anywhere between 4-10 stools every day.  CT of the abdomen revealing possible bowel perforation and free air in the abdomen. Patient is cdiff (-), but continues to have diarrhea.  Goal of Therapy:  K ~ 4.0 Mg ~ 2.0 Phos 2.5 - 4.5  Plan:  01/16 @ 1400 K+ 3.4. Ordered KCl 10 mEq IV x 4 this AM, but patient only received 3 as surgery was planned (and subsequently, canceled) and then patient went to CT. Ordered KCl 10 mEq IV x 4 after discussion with nurse. Avoiding PO at this time, as patient is still having frequent diarrhea. Will order electrolytes with AM labs.  Pharmacy will continue to monitor.   William Holland, PharmD Pharmacy Resident  01/09/2019 3:13 PM

## 2019-01-09 NOTE — Telephone Encounter (Signed)
App made Given to the nurse on [place]

## 2019-01-09 NOTE — Progress Notes (Signed)
Progress Note  Patient Name: William Holland Date of Encounter: 01/09/2019  Primary Cardiologist: New, Dr. Fletcher Anon  Subjective   Overall no complaints, reports feeling weak, some mild abdominal discomfort No significant shortness of breath laying supine " Hungry"  Inpatient Medications    Scheduled Meds: . enoxaparin (LOVENOX) injection  40 mg Subcutaneous Q24H  . levothyroxine  25 mcg Intravenous Daily  . magnesium citrate  1 Bottle Oral BID  . tamsulosin  0.4 mg Oral Daily   Continuous Infusions: . sodium chloride 10 mL/hr (01/07/19 1208)  . piperacillin-tazobactam (ZOSYN)  IV 3.375 g (01/09/19 1521)  . potassium chloride     PRN Meds: acetaminophen **OR** acetaminophen, Influenza vac split quadrivalent PF, ondansetron **OR** ondansetron (ZOFRAN) IV, pneumococcal 23 valent vaccine   Vital Signs    Vitals:   01/09/19 0119 01/09/19 0454 01/09/19 0500 01/09/19 0758  BP: (!) 104/50 (!) 103/57  (!) 101/52  Pulse: 81 88  88  Resp:  (!) 24  20  Temp:  98.2 F (36.8 C)  98 F (36.7 C)  TempSrc:  Oral  Oral  SpO2:  94%  93%  Weight:   76.4 kg   Height:        Intake/Output Summary (Last 24 hours) at 01/09/2019 1543 Last data filed at 01/09/2019 0630 Gross per 24 hour  Intake -  Output 3420 ml  Net -3420 ml   Filed Weights   01/07/19 0411 01/08/19 0529 01/09/19 0500  Weight: 77.2 kg 76.2 kg 76.4 kg    Telemetry    NSR, PACs and PVCs - Personally Reviewed  ECG    No new tracings - Personally Reviewed  Physical Exam   Constitutional: Alert, no distress laying supine HENT:  Head: Grossly normal Eyes:  no discharge. No scleral icterus.  Neck: No JVD, no carotid bruits  Cardiovascular: Regular rate and rhythm, no murmurs appreciated Pulmonary/Chest: Clear to auscultation bilaterally, scattered Rales Abdominal: Soft.   Distension with no significant tenderness Musculoskeletal: Normal range of motion Neurological: Poor muscle tone coordination normal.  He does  have muscle atrophy Skin: Skin warm and dry Psychiatric: normal affect, pleasant    Labs    Chemistry Recent Labs  Lab 01/05/19 1320  01/07/19 1026  01/08/19 0450 01/09/19 0553 01/09/19 1415  NA  --    < > 147*  --  149* 146*  --   K  --    < > 2.9*   < > 4.1 3.0* 3.4*  CL  --    < > 101  --  106 106  --   CO2  --    < > 33*  --  34* 35*  --   GLUCOSE  --    < > 80  --  65* 89  --   BUN  --    < > 16  --  20 23  --   CREATININE  --    < > 1.08  --  1.18 1.23  --   CALCIUM  --    < > 7.4*  --  7.7* 7.9*  --   PROT 5.8*  --   --   --  5.2*  --   --   ALBUMIN 2.9*  --   --   --  2.5*  --   --   AST 333*  --   --   --  83*  --   --   ALT 120*  --   --   --  73*  --   --   ALKPHOS 74  --   --   --  59  --   --   BILITOT 1.4*  --   --   --  1.6*  --   --   GFRNONAA  --    < > >60  --  >60 59*  --   GFRAA  --    < > >60  --  >60 >60  --   ANIONGAP  --    < > 13  --  9 5  --    < > = values in this interval not displayed.     Hematology Recent Labs  Lab 01/05/19 1037 01/06/19 0421 01/07/19 0204  WBC 7.8 5.5 9.8  RBC 4.17* 4.13* 4.33  HGB 11.0* 10.5* 11.0*  HCT 35.1* 34.1* 36.3*  MCV 84.2 82.6 83.8  MCH 26.4 25.4* 25.4*  MCHC 31.3 30.8 30.3  RDW 17.2* 17.1* 17.1*  PLT 201 152 172    Cardiac Enzymes Recent Labs  Lab 01/05/19 1320 01/05/19 1637 01/05/19 2159 01/06/19 0421  TROPONINI 0.16* 0.16* 0.16* 0.12*   No results for input(s): TROPIPOC in the last 168 hours.   BNP Recent Labs  Lab 01/05/19 1208  BNP 2,927.0*     DDimer No results for input(s): DDIMER in the last 168 hours.   Radiology    Ct Abdomen Pelvis W Contrast  Result Date: 01/09/2019 CLINICAL DATA:  Patient seen and examined, no acute events or new complaints overnight. Patient denies any abdominal pain, nausea, or emesis. Continues to have significant stool output (APPROX.3L per chart review), partially due to the bowel prep he received overnight EXAM: CT ABDOMEN AND PELVIS WITH CONTRAST  TECHNIQUE: Multidetector CT imaging of the abdomen and pelvis was performed using the standard protocol following bolus administration of intravenous contrast. CONTRAST:  184m OMNIPAQUE IOHEXOL 300 MG/ML  SOLN COMPARISON:  CT, 01/05/2019 FINDINGS: Lower chest: Small bilateral pleural effusions associated with dependent atelectasis. Left pleural effusion is new and right pleural effusion increased the prior study. Hepatobiliary: No focal liver abnormality is seen. No gallstones, gallbladder wall thickening, or biliary dilatation. Pancreas: Unremarkable. No pancreatic ductal dilatation or surrounding inflammatory changes. Spleen: Normal in size without focal abnormality. Adrenals/Urinary Tract: No adrenal masses. Left greater than right renal cortical thinning. Stable low-density renal masses consistent with cysts. No stones. No hydronephrosis. Ureters normal in course and in caliber. There are numerous bladder stones. Nondependent air within the bladder. Stomach/Bowel: There is a transition point in the mid sigmoid colon between collapsed bowel, with an ill-defined wall multiple diverticula and distended colon with air-fluid levels. Maximum distension is of the cecum measuring 10 cm. There is a small amount of free air as noted on the prior CT. Mild inflammatory changes are noted adjacent to the decompressed portion of the sigmoid colon. The findings suggest diverticulitis with inflammation leading to partial colonic obstruction. Could not exclude neoplasm. Stomach is unremarkable. There is mild distention of loops of small bowel with air-fluid levels. No small bowel wall thickening. Vascular/Lymphatic: Mild aortic atherosclerosis no aneurysm. No enlarged lymph nodes. Reproductive: Prostate mildly enlarged. Other: Diffuse subcutaneous soft tissue edema. No ascites. No abdominal wall hernia. Musculoskeletal: No fracture or acute finding. No osteoblastic or osteolytic lesions. IMPRESSION: 1. Small amount of free  intraperitoneal air is again noted. This has not increased. 2. Partial colonic obstruction at the level of the mid sigmoid colon. The decompressed portion of the sigmoid colon demonstrates multiple diverticula and  mild adjacent inflammation. Suspect diverticulitis with inflammation causing the partial obstruction. Neoplastic disease is not excluded. 3. No evidence of an abscess. 4. There is significant colonic distention with mild dilation of small bowel. Maximum colonic distention is the cecum at 10 cm. The degree of distention of bowel is similar to the prior exam. 5. New left and increased right pleural effusions with associated dependent atelectasis. 6. Diffuse soft tissue edema similar to the recent prior CT. Findings consistent with anasarca. 7. 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 Electronically Signed   By: Lajean Manes M.D.   On: 01/09/2019 15:20    Cardiac Studies   TTE (01/06/2019): - Left ventricle: The cavity size was at the upper limits of normal. Wall thickness was normal. Systolic function was moderately to severely reduced. The estimated ejection fraction was in the range of 30% to 35%. Diffuse hypokinesis. Regional wall motion abnormalities cannot be excluded. Features are consistent with a pseudonormal left ventricular filling pattern, with concomitant abnormal relaxation and increased filling pressure (grade 2 diastolic dysfunction). Doppler parameters are consistent with high ventricular filling pressure. - Aortic valve: Trileaflet; mildly thickened leaflets. - Mitral valve: Mildly thickened leaflets . There was mild regurgitation. - Left atrium: The atrium was moderately dilated. - Right ventricle: The cavity size was normal. Systolic function was moderately reduced. - Pericardium, extracardiac: A trivial pericardial effusion was identified. There was a right pleural effusion.   Patient Profile     73  y.o. male with a history of sigmoid diverticulitis, chronic diarrhea, and intellectual slowing with cardiology evaluation for acute heart failure with assessment of preoperative cardiac risk in setting of bowel perforation.  Assessment & Plan    A/P: Cardiomyopathy Presumed to be nonischemic, given no dramatic aortic atherosclerosis or coronary calcification on CT scan abdomen  --Not a good candidate for ischemic work-up at this time  -Cardiac work-up can be done at a later date after GI pathology improves -High risk for GI surgery  Bowel perforation/ileus Massively dilated bowel loops on CT scan, free air Repeat CT possible obstruction continued free air severely dilated bowel loops significant diffuse diverticuli Chronic diarrhea Surgery  following  Hypokalemia Unable to take oral potassium Need to continue IV potassium as needed to maintain potassium of 3.5 or higher  Anemia Likely anemia of chronic disease, poor p.o. intake Nutrition a major problem  Transaminitis Previously noted, labs improving  Prior history of alcohol abuse He reports not actively drinking, " stopped a long time ago"  Lower extremity edema Likely multifactorial but this has been slowly improving with leg elevation, Fluid loss through diarrhea Only receiving limited amount of IV fluids If plan is for continued n.p.o. given possible obstruction and ileus,  Could increase fluid rate up to 75 cc/h   Total encounter time more than 25 minutes  Greater than 50% was spent in counseling and coordination of care with the patient    For questions or updates, please contact [facility] Please consult [email or web address] for contact info under        Signed, Ida Rogue, MD  01/09/2019, 3:43 PM

## 2019-01-09 NOTE — Telephone Encounter (Signed)
William Holland was scheduled for surgery today due to the radiographic finding of free air, seen on his CT scan in the Emergency Department on 01/05/2019.  He has never had any peritoneal signs or hemodynamic compromise suggesting that the perforation was making him sick.   Cardiology has seen him and determined that he is very high risk for any kind of surgery. We have spent the past 5 days trying to optimize his electrolyte status, which has been a challenge.  I have reviewed his imaging with Dr. Diamantina Providence, of Urology, as well as discussed the case with two other General Surgeons, Dr. Dahlia Byes and Dr. Rosana Hoes.    In light of the profound medical comorbidities this patient has, as well as the potential extensive surgery required, all of [place] agreed that he is a prohibitive operative risk.  The potential for severe complications, including prolonged ICU/hospital stay, cardiac arrest or other cardiac complications, prolonged ventilator requirement, possible tracheostomy/PEG requirement, lifetime debility and need for nursing home placement, and even death are all very real.   I called Mr. Abdulaziz brother, William Holland, who has DPOA for the patient and who provided the informed consent for today's operation. I explained my concerns in detail. Once he understood the gravity of the situation and the very high risk that surgery poses for his brother, he agreed that it would not be in [name] best interest to proceed to the OR today.  We will, instead, obtain another CT scan to evaluate for any worsening of the intrabdominal findings that might change the odds in favor of operation.  William Holland and his wife will be at the hospital later today. Hopefully we will have the results of the CT scan at that time and can discuss what our next steps should be in order to optimize the outcome for William Holland.

## 2019-01-09 NOTE — Plan of Care (Signed)
William Holland was scheduled for surgery today due to the radiographic finding of free air, seen on his CT scan in the Emergency Department on 01/05/2019.  He has never had any peritoneal signs or hemodynamic compromise suggesting that the perforation was making him sick.   Cardiology has seen him and determined that he is very high risk for any kind of surgery. We have spent the past 5 days trying to optimize his electrolyte status, which has been a challenge.  I have reviewed his imaging with Dr. Diamantina Providence, of Urology, as well as discussed the case with two other General Surgeons, Dr. Dahlia Byes and Dr. Rosana Hoes.    In light of the profound medical comorbidities this patient has, as well as the potential extensive surgery required, all of [place] agreed that he is a prohibitive operative risk.  The potential for severe complications, including prolonged ICU/hospital stay, cardiac arrest or other cardiac complications, prolonged ventilator requirement, possible tracheostomy/PEG requirement, lifetime debility and need for nursing home placement, and even death are all very real.   I called William Holland brother, William Holland, who has DPOA for the patient and who provided the informed consent for today's operation. I explained my concerns in detail. Once he understood the gravity of the situation and the very high risk that surgery poses for his brother, he agreed that it would not be in [name] best interest to proceed to the OR today.  We will, instead, obtain another CT scan to evaluate for any worsening of the intrabdominal findings that might change the odds in favor of operation.  William Holland and his wife will be at the hospital later today. Hopefully we will have the results of the CT scan at that time and can discuss what our next steps should be in order to optimize the outcome for William Holland.

## 2019-01-09 NOTE — Progress Notes (Addendum)
Pt reported that he is hungry. Pt dies was NPO. Notified prime. Will continue to monitor.  Update 2010: Talked to Dr. Bridgett Larsson and states to check with the surgeon if if pt will be able to eat. Notify the surgeon ( Dr. Lysle Pearl).  Update 2047: Talked to Dr. Lysle Pearl and states that he will check the chart first before placing orders. Also notify him about mag citrate oral for pt to drink. Will continue to drink.  Update 2049: Dr. Lysle Pearl ordered to advance pt diet to clear liquid and ordered to cancel the order for mag mag citrate since pt already having diarrhea. Will continue to monitor.

## 2019-01-09 NOTE — Consult Note (Signed)
Pharmacy Antibiotic Note  William Holland is a 73 y.o. male admitted on 01/05/2019 with intra-abdominal infection.  Patient has a perforation of bowel with a known history of diverticulosis. Surgery is planned today. Pharmacy has been consulted for Zosyn dosing.  Plan: Continue Zosyn 3.375g IV q8h (4 hour infusion).   Height: 5' 9"  (175.3 cm) Weight: 168 lb 6.4 oz (76.4 kg) IBW/kg (Calculated) : 70.7  Temp (24hrs), Avg:98.1 F (36.7 C), Min:97.7 F (36.5 C), Max:98.4 F (36.9 C)  Recent Labs  Lab 01/05/19 1037 01/05/19 1421 01/05/19 1637 01/06/19 0421 01/07/19 0204 01/07/19 1026 01/08/19 0450 01/09/19 0553  WBC 7.8  --   --  5.5 9.8  --   --   --   CREATININE 1.30*  --   --  1.11 1.08 1.08 1.18 1.23  LATICACIDVEN  --  1.0 0.9  --   --   --   --   --     Estimated Creatinine Clearance: 55.1 mL/min (by C-G formula based on SCr of 1.23 mg/dL).    No Known Allergies  Antimicrobials this admission: Zosyn 1/13 >>   Dose adjustments this admission: N/A  Microbiology results: 1/13 BCx: NGTD 1/13 UCx:  multiple spp 1/13 GI PCR NR 1/13 Cdiff (-)   Thank you for allowing pharmacy to be a part of this patient's care.  Rayna Sexton, PharmD, BCPS Clinical Pharmacist 01/09/2019 1:03 PM

## 2019-01-09 NOTE — Plan of Care (Signed)
?  Problem: Clinical Measurements: ?Goal: Will remain free from infection ?Outcome: Progressing ?  ?

## 2019-01-09 NOTE — Progress Notes (Addendum)
[facility] SURGICAL PROGRESS NOTE (cpt [phone])  Hospital Day(s): 4.   Post op day(s): n/a.   Interval History: Patient seen and examined, no acute events or new complaints overnight. Patient denies any abdominal pain, nausea, or emesis. Continues to have significant stool output (~3L per chart review), partially due to the bowel prep he received overnight. His potassium is 3.0 this morning despite continued aggressive replacement. He is scheduled for exploratory laparotomy this afternoon however given significant concern over peri-operative risk, this has been cancelled after Dr Celine Ahr discussed with patient's DPOA.   History is limited given history of intellectual slowing and lack of family at bedside.   Review of Systems:  History is limited given history of intellectual slowing and lack of family at bedside.   Gastrointestinal: denies abdominal pain, N/V, or diarrhea/and bowel function as per interval history   Vital signs in last 24 hours: [min-max] current  Temp:  [97.7 F (36.5 C)-98.4 F (36.9 C)] 98 F (36.7 C) (01/17 0758) Pulse Rate:  [76-88] 88 (01/17 0758) Resp:  [20-24] 20 (01/17 0758) BP: (93-105)/(46-60) 101/52 (01/17 0758) SpO2:  [90 %-94 %] 93 % (01/17 0758) Weight:  [76.4 kg] 76.4 kg (01/17 0500)     Height: 5' 9"  (175.3 cm) Weight: 76.4 kg BMI (Calculated): 24.86   Intake/Output this shift:  No intake/output data recorded.   Intake/Output last 2 shifts:  @IOLAST2SHIFTS @   Physical Exam:  Constitutional: alert, cooperative and no distress  HENT: normocephalic without obvious abnormality  Eyes: EOM's grossly intact and symmetric  Respiratory: breathing non-labored at rest  Gastrointestinal: soft, non-tender, and non-distended. Rectal tube present with stool in bag.  Musculoskeletal: + 2 pitting edema bilateral lower extremities extending to knees   Labs:  CBC Latest Ref Rng & Units 01/07/2019 01/06/2019 01/05/2019  WBC 4.0 - 10.5 K/uL 9.8  5.5 7.8  Hemoglobin 13.0 - 17.0 g/dL 11.0(L) 10.5(L) 11.0(L)  Hematocrit 39.0 - 52.0 % 36.3(L) 34.1(L) 35.1(L)  Platelets 150 - 400 K/uL 172 152 201   CMP Latest Ref Rng & Units 01/09/2019 01/08/2019 01/07/2019  Glucose 70 - 99 mg/dL 89 65(L) -  BUN 8 - 23 mg/dL 23 20 -  Creatinine 0.61 - 1.24 mg/dL 1.23 1.18 -  Sodium 135 - 145 mmol/L 146(H) 149(H) -  Potassium 3.5 - 5.1 mmol/L 3.0(L) 4.1 3.1(L)  Chloride 98 - 111 mmol/L 106 106 -  CO2 22 - 32 mmol/L 35(H) 34(H) -  Calcium 8.9 - 10.3 mg/dL 7.9(L) 7.7(L) -  Total Protein 6.5 - 8.1 g/dL - 5.2(L) -  Total Bilirubin 0.3 - 1.2 mg/dL - 1.6(H) -  Alkaline Phos 38 - 126 U/L - 59 -  AST 15 - 41 U/L - 83(H) -  ALT 0 - 44 U/L - 73(H) -     Assessment/Plan: (ICD-10's: K63.1) 73 y.o.malewho is hemodynamically stablewithrecurrent hypokalemia but still hypernatremic still with significant lower extremity edema most likely secondary to new onset CHF with known(likely chronic)bowel perforation,which iscomplicated by pertinent comorbidities includingchronic diarrhea, intellectual slowing, and lack of previous medical care.   - NPO + conservative IVF resuscitation given CHF; pending results of CT scan, may be able to initiate a diet - Continue IV ABx (Zosyn), pain control PRN, Antiemetics PRN; pending results of CT scan, may consider d/c of antibiotids. - Continue to monitor abdominal examination   - After Dr Celine Ahr had length discussion with medical team and patient's DPOA the decision was made to cancel the scheduled procedure today and continue medical  optimization. Please refer to Dr Glenford Peers plan of care note for further details of this.    - Will get CT A/P with PO and IV contrast to reassess bowel perforation and any associated worsening of radiographic picture.    - Appreciate WOC RN and urology consultations  - Continue medical management per primary team.    -- [name], PA-C  [facility] 01/09/2019, 12:03 PM [phone] M-F: 7am - 4pm  I saw and evaluated the patient.  I agree with the above documentation, exam, and plan, which I have edited where appropriate. [name]  12:28 PM

## 2019-01-09 NOTE — Progress Notes (Addendum)
William Holland at [place] NAME: William Holland    MR#:  [id number]  DATE OF BIRTH:  05-21-1946  SUBJECTIVE:  CHIEF COMPLAINT:   Chief Complaint  Patient presents with  . Leg Swelling  . Weakness   -Patient going for exploratory laparotomy today for bowel perforation and chronic diarrhea -Denies any complaints.  REVIEW OF SYSTEMS:  Review of Systems  Constitutional: Positive for malaise/fatigue. Negative for chills and fever.  HENT: Negative for congestion, ear discharge, hearing loss and nosebleeds.   Eyes: Negative for blurred vision and double vision.  Respiratory: Negative for cough, shortness of breath and wheezing.   Cardiovascular: Positive for leg swelling. Negative for chest pain and palpitations.  Gastrointestinal: Positive for diarrhea. Negative for abdominal pain, constipation, nausea and vomiting.  Genitourinary: Negative for dysuria.  Musculoskeletal: Negative for myalgias.  Neurological: Negative for dizziness, focal weakness, seizures and headaches.  Psychiatric/Behavioral: Negative for depression.    DRUG ALLERGIES:  No Known Allergies  VITALS:  Blood pressure (!) 101/52, pulse 88, temperature 98 F (36.7 C), temperature source Oral, resp. rate 20, height 5' 9"  (1.753 m), weight 76.4 kg, SpO2 93 %.  PHYSICAL EXAMINATION:  Physical Exam  GENERAL:  73 y.o.-year-old patient lying in the bed with no acute distress.  EYES: Pupils equal, round, reactive to light and accommodation. No scleral icterus. Extraocular muscles intact.  HEENT: Head atraumatic, normocephalic. Oropharynx and nasopharynx clear.  NECK:  Supple, no jugular venous distention. No thyroid enlargement, no tenderness.  LUNGS: Normal breath sounds bilaterally, no wheezing, rales,rhonchi or crepitation. No use of accessory muscles of respiration.  Decreased bibasilar breath sounds CARDIOVASCULAR: S1, S2 normal. No  rubs, or gallops.  2/6 systolic murmur is  present ABDOMEN: Abdomen is soft and distended but nontender, hypoactive bowel sounds present. No organomegaly or mass.  EXTREMITIES: No  cyanosis, or clubbing.  1-2+ lower extremity edema noted NEUROLOGIC: Cranial nerves II through XII are intact. Muscle strength 5/5 in all extremities. Sensation intact. Gait not checked.  PSYCHIATRIC: The patient is alert and oriented x 3. Slow stuttered speech at times. Slow intellectually per family. SKIN: No obvious rash, lesion, or ulcer.    LABORATORY PANEL:   CBC Recent Labs  Lab 01/07/19 0204  WBC 9.8  HGB 11.0*  HCT 36.3*  PLT 172   ------------------------------------------------------------------------------------------------------------------  Chemistries  Recent Labs  Lab 01/07/19 1747 01/08/19 0450 01/09/19 0553  NA  --  149* 146*  K 3.1* 4.1 3.0*  CL  --  106 106  CO2  --  34* 35*  GLUCOSE  --  65* 89  BUN  --  20 23  CREATININE  --  1.18 1.23  CALCIUM  --  7.7* 7.9*  MG 1.8  --   --   AST  --  83*  --   ALT  --  73*  --   ALKPHOS  --  59  --   BILITOT  --  1.6*  --    ------------------------------------------------------------------------------------------------------------------  Cardiac Enzymes Recent Labs  Lab 01/06/19 0421  TROPONINI 0.12*   ------------------------------------------------------------------------------------------------------------------  RADIOLOGY:  No results found.  EKG:   Orders placed or performed during the hospital encounter of 01/05/19  . ED EKG within 10 minutes  . ED EKG within 10 minutes  . EKG 12-Lead  . EKG 12-Lead    ASSESSMENT AND PLAN:   William Holland  is a 73 y.o. 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  1.  Acute CHF exacerbation-no prior history of  CHF-no known history of CHF. -Echocardiogram with EF of 30 to 35% and diffuse hypokinesis.  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 -Due to n.p.o. status and low electrolytes, Lasix is currently on hold.  However losing more fluids due to ongoing diarrhea. -Appreciate cardiology input.  High risk for surgery given his cardiac condition.  Family aware - Strict intake and output monitoring - daily weights -Appreciate cardiology consult  2.  Perforation of bowel-known history of significant diverticulosis.  Last colonoscopy in March 2019 -Likely diverticular perforation.  Nontoxic-appearing, no signs of peritonitis  -Appreciate surgical consult.  Significant diarrhea present as well. -has been empirically on Zosyn -Patient will go for exploratory laparotomy today.  Also during the procedure and he will have urological intervention possible stent placement due to presence of several stones in the bladder  3.  Elevated troponin-stable.  No heparin drip at this time.  Appreciate cardiology input.  Diffuse hypokinesis on echocardiogram.  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  Hypokalemia-replace aggressively especially with GI losses. -Pharmacy consult for electrolyte management  5.  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   -Improving LFTs. -Liver looks all right on ultrasound.  6.  Hypothyroidism-replace IV Synthroid at this time  7.  DVT prophylaxis-on Lovenox     All the records are reviewed and case discussed with Care Management/Social Workerr. Management plans discussed with the patient, family and they are in agreement.  CODE STATUS: Full code  TOTAL TIME TAKING CARE OF THIS PATIENT: 36 minutes.   POSSIBLE D/C IN 2-3 DAYS, DEPENDING ON CLINICAL CONDITION.   Gladstone Lighter M.D on 01/09/2019 at 10:17 AM  Between 7am to 6pm - Pager -  [phone]  After 6pm go to [email or web address] - password [email or web address] [facility]  Office  [phone]  CC: Primary care physician; Novella Rob, FNP

## 2019-01-09 NOTE — Telephone Encounter (Signed)
-----   Message from Billey Co, MD sent at 01/09/2019  2:21 PM EST ----- Regarding: follow up Please schedule follow up with me in 6 weeks with PVR.  Nickolas Madrid, MD 01/09/2019

## 2019-01-10 LAB — CULTURE, BLOOD (ROUTINE X 2)
CULTURE: NO GROWTH
Culture: NO GROWTH
Special Requests: ADEQUATE

## 2019-01-10 LAB — BASIC METABOLIC PANEL
Anion gap: 8 (ref 5–15)
BUN: 22 mg/dL (ref 8–23)
CO2: 33 mmol/L — ABNORMAL HIGH (ref 22–32)
Calcium: 7.5 mg/dL — ABNORMAL LOW (ref 8.9–10.3)
Chloride: 104 mmol/L (ref 98–111)
Creatinine, Ser: 1.1 mg/dL (ref 0.61–1.24)
GFR calc Af Amer: >60 mL/min (ref >60)
Glucose, Bld: 68 mg/dL — ABNORMAL LOW (ref 70–99)
Potassium: 3 mmol/L — ABNORMAL LOW (ref 3.5–5.1)
Sodium: 145 mmol/L (ref 135–145)

## 2019-01-10 LAB — CBC
HCT: 33.9 % — ABNORMAL LOW (ref 39.0–52.0)
Hemoglobin: 10.3 g/dL — ABNORMAL LOW (ref 13.0–17.0)
MCH: 26 pg (ref 26.0–34.0)
MCHC: 30.4 g/dL (ref 30.0–36.0)
MCV: 85.6 fL (ref 80.0–100.0)
Platelets: 157 10*3/uL (ref 150–400)
RBC: 3.96 MIL/uL — ABNORMAL LOW (ref 4.22–5.81)
RDW: 17.1 % — ABNORMAL HIGH (ref 11.5–15.5)
WBC: 7.4 10*3/uL (ref 4.0–10.5)
nRBC: 0 % (ref 0.0–0.2)

## 2019-01-10 LAB — MAGNESIUM: Magnesium: 2.3 mg/dL (ref 1.7–2.4)

## 2019-01-10 LAB — POTASSIUM: POTASSIUM: 3.4 mmol/L — AB (ref 3.5–5.1)

## 2019-01-10 MED ORDER — POTASSIUM CHLORIDE CRYS ER 20 MEQ PO TBCR
40.0000 meq | EXTENDED_RELEASE_TABLET | Freq: Every day | ORAL | Status: AC
  Administered 2019-01-10 – 2019-01-11 (×2): 40 meq via ORAL
  Filled 2019-01-10 (×2): qty 2

## 2019-01-10 MED ORDER — POTASSIUM CHLORIDE 10 MEQ/100ML IV SOLN
10.0000 meq | INTRAVENOUS | Status: AC
  Administered 2019-01-10 – 2019-01-11 (×6): 10 meq via INTRAVENOUS
  Filled 2019-01-10 (×6): qty 100

## 2019-01-10 MED ORDER — POTASSIUM CHLORIDE 10 MEQ/100ML IV SOLN
10.0000 meq | INTRAVENOUS | Status: AC
  Administered 2019-01-10 (×4): 10 meq via INTRAVENOUS
  Filled 2019-01-10 (×4): qty 100

## 2019-01-10 NOTE — Progress Notes (Signed)
Subjective:  CC:  William Holland is a 73 y.o. male  Hospital stay day 5, bowel perforation, likely from diverticular disease  HPI: No reported issues overnight.  Pt eating CLD.  States no pain, only slight distention,  BMs slowing down per report.  ROS:  General: Denies weight loss, weight gain, fatigue, fevers, chills, and night sweats. Heart: Denies chest pain, palpitations, racing heart, irregular heartbeat, leg pain or swelling, and decreased activity tolerance. Respiratory: Denies breathing difficulty, shortness of breath, wheezing, cough, and sputum. GI: Denies change in appetite, heartburn, nausea, vomiting, and blood in stool. GU: Denies difficulty urinating, pain with urinating, urgency, frequency, blood in urine.   Objective:      Temp:  [97.8 F (36.6 C)-98.1 F (36.7 C)] 98.1 F (36.7 C) (01/18 0734) Pulse Rate:  [76-80] 78 (01/18 0734) Resp:  [16] 16 (01/18 0604) BP: (95-108)/(52-60) 108/52 (01/18 0734) SpO2:  [91 %-92 %] 91 % (01/18 0734) Weight:  [74.1 kg] 74.1 kg (01/18 0604)     Height: 5' 9"  (175.3 cm) Weight: 74.1 kg BMI (Calculated): 24.12   Intake/Output this shift:   Intake/Output Summary (Last 24 hours) at 01/10/2019 1949 Last data filed at 01/10/2019 1900 Gross per 24 hour  Intake 600 ml  Output 1925 ml  Net -1325 ml      Constitutional :  alert, cooperative, appears stated age and no distress  Respiratory:  clear to auscultation bilaterally  Cardiovascular:  regular rate and rhythm  Gastrointestinal: soft, non-tender; bowel sounds normal; no masses,  no organomegaly. distended  Skin: Cool and moist.   Psychiatric: Normal affect, non-agitated, not confused       LABS:  CMP Latest Ref Rng & Units 01/10/2019 01/10/2019 01/09/2019  Glucose 70 - 99 mg/dL - 68(L) -  BUN 8 - 23 mg/dL - 22 -  Creatinine 0.61 - 1.24 mg/dL - 1.10 -  Sodium 135 - 145 mmol/L - 145 -  Potassium 3.5 - 5.1 mmol/L 3.4(L) 3.0(L) 3.4(L)  Chloride 98 - 111 mmol/L - 104 -  CO2 22  - 32 mmol/L - 33(H) -  Calcium 8.9 - 10.3 mg/dL - 7.5(L) -  Total Protein 6.5 - 8.1 g/dL - - -  Total Bilirubin 0.3 - 1.2 mg/dL - - -  Alkaline Phos 38 - 126 U/L - - -  AST 15 - 41 U/L - - -  ALT 0 - 44 U/L - - -   CBC Latest Ref Rng & Units 01/10/2019 01/07/2019 01/06/2019  WBC 4.0 - 10.5 K/uL 7.4 9.8 5.5  Hemoglobin 13.0 - 17.0 g/dL 10.3(L) 11.0(L) 10.5(L)  Hematocrit 39.0 - 52.0 % 33.9(L) 36.3(L) 34.1(L)  Platelets 150 - 400 K/uL 157 172 152    RADS: CLINICAL DATA:  Patient seen and examined, no acute events or new complaints overnight. Patient denies any abdominal pain, nausea, or emesis. Continues to have significant stool output (APPROX.3L per chart review), partially due to the bowel prep he received overnight  EXAM: CT ABDOMEN AND PELVIS WITH CONTRAST  TECHNIQUE: Multidetector CT imaging of the abdomen and pelvis was performed using the standard protocol following bolus administration of intravenous contrast.  CONTRAST:  162m OMNIPAQUE IOHEXOL 300 MG/ML  SOLN  COMPARISON:  CT, 01/05/2019  FINDINGS: Lower chest: Small bilateral pleural effusions associated with dependent atelectasis. Left pleural effusion is new and right pleural effusion increased the prior study.  Hepatobiliary: No focal liver abnormality is seen. No gallstones, gallbladder wall thickening, or biliary dilatation.  Pancreas: Unremarkable. No pancreatic ductal  dilatation or surrounding inflammatory changes.  Spleen: Normal in size without focal abnormality.  Adrenals/Urinary Tract: No adrenal masses. Left greater than right renal cortical thinning. Stable low-density renal masses consistent with cysts. No stones. No hydronephrosis. Ureters normal in course and in caliber.  There are numerous bladder stones. Nondependent air within the bladder.  Stomach/Bowel: There is a transition point in the mid sigmoid colon between collapsed bowel, with an ill-defined wall  multiple diverticula and distended colon with air-fluid levels. Maximum distension is of the cecum measuring 10 cm. There is a small amount of free air as noted on the prior CT. Mild inflammatory changes are noted adjacent to the decompressed portion of the sigmoid colon. The findings suggest diverticulitis with inflammation leading to partial colonic obstruction. Could not exclude neoplasm.  Stomach is unremarkable. There is mild distention of loops of small bowel with air-fluid levels. No small bowel wall thickening.  Vascular/Lymphatic: Mild aortic atherosclerosis no aneurysm. No enlarged lymph nodes.  Reproductive: Prostate mildly enlarged.  Other: Diffuse subcutaneous soft tissue edema. No ascites. No abdominal wall hernia.  Musculoskeletal: No fracture or acute finding. No osteoblastic or osteolytic lesions.  IMPRESSION: 1. Small amount of free intraperitoneal air is again noted. This has not increased. 2. Partial colonic obstruction at the level of the mid sigmoid colon. The decompressed portion of the sigmoid colon demonstrates multiple diverticula and mild adjacent inflammation. Suspect diverticulitis with inflammation causing the partial obstruction. Neoplastic disease is not excluded. 3. No evidence of an abscess. 4. There is significant colonic distention with mild dilation of small bowel. Maximum colonic distention is the cecum at 10 cm. The degree of distention of bowel is similar to the prior exam. 5. New left and increased right pleural effusions with associated dependent atelectasis. 6. Diffuse soft tissue edema similar to the recent prior CT. Findings consistent with anasarca. 7. 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   Electronically Signed   By: Lajean Manes M.D.   On: 01/09/2019 15:20 Assessment:    bowel perforation, likely from diverticular disease.  Covering for Dr. Celine Ahr, and chart reviewed.   Agree with high risk for surgery, and no need for immediate intervention at this point since patient remains stable, CT and labs reassuring.  Will continue to monitor diet advancement and bowel movements for now.  Further medical management per ICU team.

## 2019-01-10 NOTE — Progress Notes (Signed)
[place] at [place] NAME: William Holland    MR#:  [id number]  DATE OF BIRTH:  1946/03/09  SUBJECTIVE:  CHIEF COMPLAINT:   Chief Complaint  Patient presents with  . Leg Swelling  . Weakness   -Exploratory laparotomy surgery canceled yesterday due to high perioperative risk given his cardiac condition.  And also patient is asymptomatic. -Plan to start on liquid diet.  Physical therapy today.  REVIEW OF SYSTEMS:  Review of Systems  Constitutional: Positive for malaise/fatigue. Negative for chills and fever.  HENT: Negative for congestion, ear discharge, hearing loss and nosebleeds.   Eyes: Negative for blurred vision and double vision.  Respiratory: Negative for cough, shortness of breath and wheezing.   Cardiovascular: Positive for leg swelling. Negative for chest pain and palpitations.  Gastrointestinal: Positive for diarrhea. Negative for abdominal pain, constipation, nausea and vomiting.  Genitourinary: Negative for dysuria.  Musculoskeletal: Negative for myalgias.  Neurological: Negative for dizziness, focal weakness, seizures and headaches.  Psychiatric/Behavioral: Negative for depression.    DRUG ALLERGIES:  No Known Allergies  VITALS:  Blood pressure (!) 108/52, pulse 78, temperature 98.1 F (36.7 C), temperature source Oral, resp. rate 16, height 5' 9"  (1.753 m), weight 74.1 kg, SpO2 91 %.  PHYSICAL EXAMINATION:  Physical Exam  GENERAL:  73 y.o.-year-old patient lying in the bed with no acute distress.  EYES: Pupils equal, round, reactive to light and accommodation. No scleral icterus. Extraocular muscles intact.  HEENT: Head atraumatic, normocephalic. Oropharynx and nasopharynx clear.  NECK:  Supple, no jugular venous distention. No thyroid enlargement, no tenderness.  LUNGS: Normal breath sounds bilaterally, no wheezing, rales,rhonchi or crepitation. No use of accessory muscles of respiration.  Decreased bibasilar breath  sounds CARDIOVASCULAR: S1, S2 normal. No  rubs, or gallops.  2/6 systolic murmur is present ABDOMEN: Abdomen is soft and distended but nontender, hypoactive bowel sounds present. No organomegaly or mass.  EXTREMITIES: No  cyanosis, or clubbing.  1-2+ lower extremity edema noted NEUROLOGIC: Cranial nerves II through XII are intact. Muscle strength 5/5 in all extremities. Sensation intact. Gait not checked.  PSYCHIATRIC: The patient is alert and oriented x 3. Slow stuttered speech at times. Slow intellectually per family. SKIN: No obvious rash, lesion, or ulcer.    LABORATORY PANEL:   CBC Recent Labs  Lab 01/10/19 0448  WBC 7.4  HGB 10.3*  HCT 33.9*  PLT 157   ------------------------------------------------------------------------------------------------------------------  Chemistries  Recent Labs  Lab 01/08/19 0450  01/10/19 0448  NA 149*   < > 145  K 4.1   < > 3.0*  CL 106   < > 104  CO2 34*   < > 33*  GLUCOSE 65*   < > 68*  BUN 20   < > 22  CREATININE 1.18   < > 1.10  CALCIUM 7.7*   < > 7.5*  MG  --   --  2.3  AST 83*  --   --   ALT 73*  --   --   ALKPHOS 59  --   --   BILITOT 1.6*  --   --    < > = values in this interval not displayed.   ------------------------------------------------------------------------------------------------------------------  Cardiac Enzymes Recent Labs  Lab 01/06/19 0421  TROPONINI 0.12*   ------------------------------------------------------------------------------------------------------------------  RADIOLOGY:  Ct Abdomen Pelvis W Contrast  Result Date: 01/09/2019 CLINICAL DATA:  Patient seen and examined, no acute events or new complaints overnight. Patient denies any abdominal pain,  nausea, or emesis. Continues to have significant stool output (APPROX.3L per chart review), partially due to the bowel prep he received overnight EXAM: CT ABDOMEN AND PELVIS WITH CONTRAST TECHNIQUE: Multidetector CT imaging of the abdomen and  pelvis was performed using the standard protocol following bolus administration of intravenous contrast. CONTRAST:  1109m OMNIPAQUE IOHEXOL 300 MG/ML  SOLN COMPARISON:  CT, 01/05/2019 FINDINGS: Lower chest: Small bilateral pleural effusions associated with dependent atelectasis. Left pleural effusion is new and right pleural effusion increased the prior study. Hepatobiliary: No focal liver abnormality is seen. No gallstones, gallbladder wall thickening, or biliary dilatation. Pancreas: Unremarkable. No pancreatic ductal dilatation or surrounding inflammatory changes. Spleen: Normal in size without focal abnormality. Adrenals/Urinary Tract: No adrenal masses. Left greater than right renal cortical thinning. Stable low-density renal masses consistent with cysts. No stones. No hydronephrosis. Ureters normal in course and in caliber. There are numerous bladder stones. Nondependent air within the bladder. Stomach/Bowel: There is a transition point in the mid sigmoid colon between collapsed bowel, with an ill-defined wall multiple diverticula and distended colon with air-fluid levels. Maximum distension is of the cecum measuring 10 cm. There is a small amount of free air as noted on the prior CT. Mild inflammatory changes are noted adjacent to the decompressed portion of the sigmoid colon. The findings suggest diverticulitis with inflammation leading to partial colonic obstruction. Could not exclude neoplasm. Stomach is unremarkable. There is mild distention of loops of small bowel with air-fluid levels. No small bowel wall thickening. Vascular/Lymphatic: Mild aortic atherosclerosis no aneurysm. No enlarged lymph nodes. Reproductive: Prostate mildly enlarged. Other: Diffuse subcutaneous soft tissue edema. No ascites. No abdominal wall hernia. Musculoskeletal: No fracture or acute finding. No osteoblastic or osteolytic lesions. IMPRESSION: 1. Small amount of free intraperitoneal air is again noted. This has not increased.  2. Partial colonic obstruction at the level of the mid sigmoid colon. The decompressed portion of the sigmoid colon demonstrates multiple diverticula and mild adjacent inflammation. Suspect diverticulitis with inflammation causing the partial obstruction. Neoplastic disease is not excluded. 3. No evidence of an abscess. 4. There is significant colonic distention with mild dilation of small bowel. Maximum colonic distention is the cecum at 10 cm. The degree of distention of bowel is similar to the prior exam. 5. New left and increased right pleural effusions with associated dependent atelectasis. 6. Diffuse soft tissue edema similar to the recent prior CT. Findings consistent with anasarca. 7. 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 Electronically Signed   By: DLajean ManesM.D.   On: 01/09/2019 15:20    EKG:   Orders placed or performed during the hospital encounter of 01/05/19  . ED EKG within 10 minutes  . ED EKG within 10 minutes  . EKG 12-Lead  . EKG 12-Lead    ASSESSMENT AND PLAN:   JRita Prom is a 73y.o. 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  1.  Acute CHF exacerbation-no prior history of CHF-no known history of CHF. -Echocardiogram with EF of 30 to 35% and diffuse hypokinesis.  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 -Appreciate cardiology input.  High risk for surgery given his cardiac condition.    - Strict intake and output monitoring - daily weights  2.  Perforation of bowel-known history of significant diverticulosis.  Last colonoscopy in March 2019 -Likely diverticular perforation.  Nontoxic-appearing, no signs of peritonitis  -Appreciate surgical consult.   -Due to high  perioperative risk, plans to operate were canceled.  Repeat CT showing small amount of intraperitoneal air which is stable and no contrast extravasation.  Likely healing. -Management per surgery.  Started on a clear liquid diet for now -Partial colonic obstruction secondary to inflammation associated with diverticulitis.  We will continue Zosyn for now.   -Also has multiple stones in the bladder and evidence of chronic cystitis.     3.  Elevated troponin-stable.  Appreciate cardiology input.  Diffuse hypokinesis on echocardiogram.  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  Hypokalemia-replace aggressively especially with GI losses. -Pharmacy consult for electrolyte management  5.  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   -Improving LFTs. -Liver looks all right on ultrasound.  6.  Hypothyroidism-on IV Synthroid at this time-change to oral Synthroid tomorrow  7.  DVT prophylaxis-on Lovenox  Physical therapy consult today   All the records are reviewed and case discussed with Care Management/Social Workerr. Management plans discussed with the patient, family and they are in agreement.  CODE STATUS: Full code  TOTAL TIME TAKING CARE OF THIS PATIENT: 36 minutes.   POSSIBLE D/C IN 2-3 DAYS, DEPENDING ON CLINICAL CONDITION.   Gladstone Lighter M.D on 01/10/2019 at 7:52 AM  Between 7am to 6pm - Pager - [phone]  After 6pm go to [email or web address] - password [email or web address] [facility]  Office  [phone]  CC: Primary care physician; Novella Rob, FNP

## 2019-01-10 NOTE — Consult Note (Signed)
PHARMACY CONSULT NOTE - FOLLOW UP  Pharmacy Consult for Electrolyte Monitoring and Replacement   Recent Labs: Potassium (mmol/L)  Date Value  01/10/2019 3.0 (L)   Magnesium (mg/dL)  Date Value  01/10/2019 2.3   Calcium (mg/dL)  Date Value  01/10/2019 7.5 (L)   Albumin (g/dL)  Date Value  01/08/2019 2.5 (L)   Phosphorus (mg/dL)  Date Value  01/06/2019 3.2   Sodium (mmol/L)  Date Value  01/10/2019 145     Assessment: 73 y.o. 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 Patient says on an average that he has anywhere between 4-10 stools every day.  CT of the abdomen revealing possible bowel perforation and free air in the abdomen. Patient is cdiff (-), but continues to have diarrhea.  Goal of Therapy:  K ~ 4.0 Mg ~ 2.0 Phos 2.5 - 4.5  Plan:  K 3.0  Mag 2.3  Scr 1.10 MD has ordered KCL IV 10 meq x4 and po KCL 40 meq x 2 doses. Patient with  frequent diarrhea. Will check K at 1800 and Will order electrolytes with AM labs.  Pharmacy will continue to monitor.   Chinita Greenland PharmD Clinical Pharmacist 01/10/2019

## 2019-01-10 NOTE — Consult Note (Signed)
PHARMACY CONSULT NOTE - FOLLOW UP  Pharmacy Consult for Electrolyte Monitoring and Replacement   Recent Labs: Potassium (mmol/L)  Date Value  01/10/2019 3.4 (L)   Magnesium (mg/dL)  Date Value  01/10/2019 2.3   Calcium (mg/dL)  Date Value  01/10/2019 7.5 (L)   Albumin (g/dL)  Date Value  01/08/2019 2.5 (L)   Phosphorus (mg/dL)  Date Value  01/06/2019 3.2   Sodium (mmol/L)  Date Value  01/10/2019 145     Assessment: 73 y.o. 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 Patient says on an average that he has anywhere between 4-10 stools every day.  CT of the abdomen revealing possible bowel perforation and free air in the abdomen. Patient is cdiff (-), but continues to have diarrhea.  Goal of Therapy:  K ~ 4.0 Mg ~ 2.0 Phos 2.5 - 4.5  Plan:  K 3.4 Patient with  frequent diarrhea. Will order KCl 10 mEq every 1 hour for 6 doses. Will order electrolytes with AM labs.  Pharmacy will continue to monitor.   Evelena Asa, PharmD Clinical Pharmacist 01/10/2019

## 2019-01-11 LAB — BASIC METABOLIC PANEL
Anion gap: 2 — ABNORMAL LOW (ref 5–15)
BUN: 17 mg/dL (ref 8–23)
CO2: 34 mmol/L — AB (ref 22–32)
CREATININE: 1.04 mg/dL (ref 0.61–1.24)
Calcium: 7.4 mg/dL — ABNORMAL LOW (ref 8.9–10.3)
Chloride: 105 mmol/L (ref 98–111)
GFR calc non Af Amer: >60 mL/min (ref >60)
Glucose, Bld: 94 mg/dL (ref 70–99)
Potassium: 3.5 mmol/L (ref 3.5–5.1)
Sodium: 141 mmol/L (ref 135–145)

## 2019-01-11 MED ORDER — CIPROFLOXACIN HCL 500 MG PO TABS
500.0000 mg | ORAL_TABLET | Freq: Two times a day (BID) | ORAL | Status: DC
  Administered 2019-01-11 – 2019-01-13 (×5): 500 mg via ORAL
  Filled 2019-01-11 (×6): qty 1

## 2019-01-11 MED ORDER — METRONIDAZOLE 500 MG PO TABS
500.0000 mg | ORAL_TABLET | Freq: Three times a day (TID) | ORAL | Status: DC
  Administered 2019-01-11 – 2019-01-13 (×7): 500 mg via ORAL
  Filled 2019-01-11 (×9): qty 1

## 2019-01-11 MED ORDER — SODIUM CHLORIDE 0.9 % IV SOLN
INTRAVENOUS | Status: DC | PRN
  Administered 2019-01-11: 500 mL via INTRAVENOUS

## 2019-01-11 MED ORDER — LEVOTHYROXINE SODIUM 25 MCG PO TABS
25.0000 ug | ORAL_TABLET | Freq: Every day | ORAL | Status: DC
  Administered 2019-01-11 – 2019-01-12 (×2): 25 ug via ORAL
  Filled 2019-01-11: qty 1

## 2019-01-11 MED ORDER — SODIUM CHLORIDE 0.9% FLUSH: 3.0000 mL | INTRAVENOUS | Status: DC | PRN

## 2019-01-11 MED ORDER — SODIUM CHLORIDE 0.9% FLUSH
3.0000 mL | Freq: Two times a day (BID) | INTRAVENOUS | Status: DC
  Administered 2019-01-11 – 2019-01-13 (×5): 3 mL via INTRAVENOUS

## 2019-01-11 MED ORDER — POTASSIUM CHLORIDE 10 MEQ/100ML IV SOLN
10.0000 meq | INTRAVENOUS | Status: AC
  Administered 2019-01-11 (×2): 10 meq via INTRAVENOUS
  Filled 2019-01-11 (×2): qty 100

## 2019-01-11 NOTE — Consult Note (Signed)
PHARMACY CONSULT NOTE - FOLLOW UP  Pharmacy Consult for Electrolyte Monitoring and Replacement   Recent Labs: Potassium (mmol/L)  Date Value  01/11/2019 3.5   Magnesium (mg/dL)  Date Value  01/10/2019 2.3   Calcium (mg/dL)  Date Value  01/11/2019 7.4 (L)   Albumin (g/dL)  Date Value  01/08/2019 2.5 (L)   Phosphorus (mg/dL)  Date Value  01/06/2019 3.2   Sodium (mmol/L)  Date Value  01/11/2019 141     Assessment: 73 y.o. 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 Patient says on an average that he has anywhere between 4-10 stools every day.  CT of the abdomen revealing possible bowel perforation and free air in the abdomen. Patient is cdiff (-), but continues to have diarrhea.  Goal of Therapy:  K ~ 4.0 Mg ~ 2.0 Phos 2.5 - 4.5  Plan:  K 3.5  Scr 1.04 Patient with  frequent diarrhea. Will order KCl 10 mEq IV every 1 hour for 2 doses. Will order electrolytes with AM labs.  Pharmacy will continue to monitor.  Chinita Greenland PharmD Clinical Pharmacist 01/11/2019

## 2019-01-11 NOTE — Progress Notes (Signed)
Physical Therapy Treatment Patient Details Name: William Holland MRN: [id number] DOB: 04/03/1946 Today's Date: 01/11/2019    History of Present Illness Pt is a 72 year old male admitted with hypokalemia, bowel perforation and pressure ulcers of LE's following family c/o pt experiencing 4 falls in the past week and "swelling" and "weeping" of the LE's.  Pt has a rectal tube in place.  Exploratory laparotomy scheduled for 01/09/2019.  PMH includes sigmoid diverticulosis, recent diagnosis of Crohns and delayed intellectual development since birth.    PT Comments    Pt able to progress to 40 ft of ambulation today with good use of RW.  He appears to be more alert and able to follow commands today.  Pt also demonstrated progress with STS transfers.  Much of the treatment was spent assisting the pt with toileting which included multiple STS's from standard height toilet for which pt required Min A.  Pt will benefit from [facility] at home for this reason.  PT guided pt verbally pt through seated there ex and pt was able to complete without physical assistance.  Pt will continue to benefit from skilled PT with focus on strength, safe functional mobility and tolerance to activity.  Follow Up Recommendations  Home health PT;Supervision - Intermittent     Equipment Recommendations  None recommended by PT;Rolling walker with 5" wheels;3in1 (PT)(Pt did state that he has a RW.  If he does not, pt will need a RW.)    Recommendations for Other Services       Precautions / Restrictions Precautions Precautions: Fall Precaution Comments: 4 falls in the past week Restrictions Weight Bearing Restrictions: No    Mobility  Bed Mobility Overal bed mobility: Modified Independent                Transfers Overall transfer level: Needs assistance Equipment used: Rolling walker (2 wheeled) Transfers: Sit to/from Stand Sit to Stand: Supervision         General transfer comment: Pt able to stand from bedside  without assistance; required min A to stand from standard height toilet.  Ambulation/Gait Ambulation/Gait assistance: Min guard Gait Distance (Feet): 40 Feet Assistive device: Rolling walker (2 wheeled)     Gait velocity interpretation: 1.31 - 2.62 ft/sec, indicative of limited community ambulator General Gait Details: Able to ambulate to restroom today without significant increase in fatigue.  Fair foot clearance and step length.   Stairs             Wheelchair Mobility    Modified Rankin (Stroke Patients Only)       Balance Overall balance assessment: Needs assistance Sitting-balance support: Bilateral upper extremity supported;Feet supported Sitting balance-Leahy Scale: Fair     Standing balance support: Bilateral upper extremity supported Standing balance-Leahy Scale: Fair Standing balance comment: Pt unsteady on feet without RW.  Has a RW at home and willing to use.                            Cognition Arousal/Alertness: Awake/alert Behavior During Therapy: WFL for tasks assessed/performed Overall Cognitive Status: History of cognitive impairments - at baseline                                 General Comments: Follows commands consistently      Exercises General Exercises - Lower Extremity Ankle Circles/Pumps: 10 reps;Strengthening;Supine;Both Quad Sets: Both;Strengthening;10 reps;Supine Heel Slides: Strengthening;Both;10 reps;Supine  Hip ABduction/ADduction: Strengthening;Both;10 reps;Supine(hip adduction pillow squeeze) Straight Leg Raises: Both;5 reps;Supine;Strengthening Other Exercises Other Exercises: Assistance with toileting x15 min, multiple STS's with min A and use of hand rail Other Exercises: Education regarding managment of HEP and importance of frequent mobility x4 min    General Comments        Pertinent Vitals/Pain      Home Living                      Prior Function            PT Goals  (current goals can now be found in the care plan section) Acute Rehab PT Goals PT Goal Formulation: Patient unable to participate in goal setting Progress towards PT goals: Progressing toward goals    Frequency    Min 2X/week      PT Plan      Co-evaluation              AM-PAC PT "6 Clicks" Mobility   Outcome Measure  Help needed turning from your back to your side while in a flat bed without using bedrails?: None Help needed moving from lying on your back to sitting on the side of a flat bed without using bedrails?: A Little Help needed moving to and from a bed to a chair (including a wheelchair)?: A Little Help needed standing up from a chair using your arms (e.g., wheelchair or bedside chair)?: A Little Help needed to walk in hospital room?: A Little Help needed climbing 3-5 steps with a railing? : A Little 6 Click Score: 19    End of Session Equipment Utilized During Treatment: Gait belt Activity Tolerance: Patient limited by fatigue Patient left: in bed;with call bell/phone within reach;with bed alarm set Nurse Communication: Mobility status PT Visit Diagnosis: Unsteadiness on feet (R26.81);Repeated falls (R29.6);History of falling (Z91.81);Muscle weakness (generalized) (M62.81)     Time: 2836-6294 PT Time Calculation (min) (ACUTE ONLY): 32 min  Charges:  $Therapeutic Exercise: 8-22 mins $Therapeutic Activity: 8-22 mins                     Roxanne Gates, PT, DPT    Roxanne Gates 01/11/2019, 4:30 PM

## 2019-01-11 NOTE — Plan of Care (Signed)
Ambulated with PT without difficulty. Stability has changed very little since admission.  Diarrhea continues to be the limiting factor on his mobility.

## 2019-01-11 NOTE — Progress Notes (Signed)
Subjective:  CC:  William Holland is a 73 y.o. male  Hospital stay day 6, bowel perforation, likely from diverticular disease  HPI: No reported issues overnight.  Pt eating FLD.  States no pain, only slight distention.  ROS:  General: Denies weight loss, weight gain, fatigue, fevers, chills, and night sweats. Heart: Denies chest pain, palpitations, racing heart, irregular heartbeat, leg pain or swelling, and decreased activity tolerance. Respiratory: Denies breathing difficulty, shortness of breath, wheezing, cough, and sputum. GI: Denies change in appetite, heartburn, nausea, vomiting, and blood in stool. GU: Denies difficulty urinating, pain with urinating, urgency, frequency, blood in urine.   Objective:      Temp:  [97.7 F (36.5 C)-98.9 F (37.2 C)] 97.7 F (36.5 C) (01/19 1737) Pulse Rate:  [73-79] 73 (01/19 1920) Resp:  [16] 16 (01/19 0430) BP: (92-106)/(43-68) 93/68 (01/19 1920) SpO2:  [94 %-98 %] 95 % (01/19 1920) Weight:  [76.3 kg] 76.3 kg (01/19 0430)     Height: 5' 9"  (175.3 cm) Weight: 76.3 kg BMI (Calculated): 24.84   Intake/Output this shift:   Intake/Output Summary (Last 24 hours) at 01/11/2019 2056 Last data filed at 01/11/2019 1845 Gross per 24 hour  Intake 1972.02 ml  Output 6955 ml  Net -4982.98 ml      Constitutional :  alert, cooperative, appears stated age and no distress  Respiratory:  clear to auscultation bilaterally  Cardiovascular:  regular rate and rhythm  Gastrointestinal: soft, non-tender; bowel sounds normal; no masses,  no organomegaly. Still distended  Skin: Cool and moist.   Psychiatric: Normal affect, non-agitated, not confused       LABS:  CMP Latest Ref Rng & Units 01/11/2019 01/10/2019 01/10/2019  Glucose 70 - 99 mg/dL 94 - 68(L)  BUN 8 - 23 mg/dL 17 - 22  Creatinine 0.61 - 1.24 mg/dL 1.04 - 1.10  Sodium 135 - 145 mmol/L 141 - 145  Potassium 3.5 - 5.1 mmol/L 3.5 3.4(L) 3.0(L)  Chloride 98 - 111 mmol/L 105 - 104  CO2 22 - 32 mmol/L  34(H) - 33(H)  Calcium 8.9 - 10.3 mg/dL 7.4(L) - 7.5(L)  Total Protein 6.5 - 8.1 g/dL - - -  Total Bilirubin 0.3 - 1.2 mg/dL - - -  Alkaline Phos 38 - 126 U/L - - -  AST 15 - 41 U/L - - -  ALT 0 - 44 U/L - - -   CBC Latest Ref Rng & Units 01/10/2019 01/07/2019 01/06/2019  WBC 4.0 - 10.5 K/uL 7.4 9.8 5.5  Hemoglobin 13.0 - 17.0 g/dL 10.3(L) 11.0(L) 10.5(L)  Hematocrit 39.0 - 52.0 % 33.9(L) 36.3(L) 34.1(L)  Platelets 150 - 400 K/uL 157 172 152    RADS:  Assessment:    bowel perforation, likely from diverticular disease.  Covering for Dr. Celine Ahr, and chart reviewed.  Agree with high risk for surgery.  Clinically remained stable now tolerating a full liquid diet.  Continue present management per medical team

## 2019-01-11 NOTE — Progress Notes (Signed)
Flexiseal removed.  Skin around rectum is intact and without irritation.  Barrier cream applied to this skin for protection.  We will ambulate him as far as he can tolerate and then have him in the chair for a while.

## 2019-01-11 NOTE — Progress Notes (Signed)
[facility] [place] NAME: William Holland    MR#:  [id number]  DATE OF BIRTH:  Nov 20, 1946  SUBJECTIVE:  CHIEF COMPLAINT:   Chief Complaint  Patient presents with  . Leg Swelling  . Weakness   -No complaints.  Feels about the same -Tolerating clear liquid diet well.  Diarrhea is improving some  REVIEW OF SYSTEMS:  Review of Systems  Constitutional: Positive for malaise/fatigue. Negative for chills and fever.  HENT: Negative for congestion, ear discharge, hearing loss and nosebleeds.   Eyes: Negative for blurred vision and double vision.  Respiratory: Negative for cough, shortness of breath and wheezing.   Cardiovascular: Positive for leg swelling. Negative for chest pain and palpitations.  Gastrointestinal: Positive for diarrhea. Negative for abdominal pain, constipation, nausea and vomiting.  Genitourinary: Negative for dysuria.  Musculoskeletal: Negative for myalgias.  Neurological: Negative for dizziness, focal weakness, seizures and headaches.  Psychiatric/Behavioral: Negative for depression.    DRUG ALLERGIES:  No Known Allergies  VITALS:  Blood pressure (!) 106/54, pulse 77, temperature 98 F (36.7 C), temperature source Oral, resp. rate 16, height 5' 9"  (1.753 m), weight 76.3 kg, SpO2 95 %.  PHYSICAL EXAMINATION:  Physical Exam  GENERAL:  73 y.o.-year-old patient lying in the bed with no acute distress.  EYES: Pupils equal, round, reactive to light and accommodation. No scleral icterus. Extraocular muscles intact.  HEENT: Head atraumatic, normocephalic. Oropharynx and nasopharynx clear.  NECK:  Supple, no jugular venous distention. No thyroid enlargement, no tenderness.  LUNGS: Normal breath sounds bilaterally, no wheezing, rales,rhonchi or crepitation. No use of accessory muscles of respiration.  Decreased bibasilar breath sounds CARDIOVASCULAR: S1, S2 normal. No  rubs, or gallops.  2/6 systolic murmur is present ABDOMEN:  Abdomen is soft and distended but nontender, hypoactive bowel sounds present. No organomegaly or mass.  -Significant scrotal and penile edema noted EXTREMITIES: No  cyanosis, or clubbing.  1-2+ lower extremity edema noted NEUROLOGIC: Cranial nerves II through XII are intact. Muscle strength 5/5 in all extremities. Sensation intact. Gait not checked.  PSYCHIATRIC: The patient is alert and oriented x 3. Slow stuttered speech at times. Slow intellectually per family. SKIN: No obvious rash, lesion, or ulcer.    LABORATORY PANEL:   CBC Recent Labs  Lab 01/10/19 0448  WBC 7.4  HGB 10.3*  HCT 33.9*  PLT 157   ------------------------------------------------------------------------------------------------------------------  Chemistries  Recent Labs  Lab 01/08/19 0450  01/10/19 0448  01/11/19 0555  NA 149*   < > 145  --  141  K 4.1   < > 3.0*   < > 3.5  CL 106   < > 104  --  105  CO2 34*   < > 33*  --  34*  GLUCOSE 65*   < > 68*  --  94  BUN 20   < > 22  --  17  CREATININE 1.18   < > 1.10  --  1.04  CALCIUM 7.7*   < > 7.5*  --  7.4*  MG  --   --  2.3  --   --   AST 83*  --   --   --   --   ALT 73*  --   --   --   --   ALKPHOS 59  --   --   --   --   BILITOT 1.6*  --   --   --   --    < > =  values in this interval not displayed.   ------------------------------------------------------------------------------------------------------------------  Cardiac Enzymes Recent Labs  Lab 01/06/19 0421  TROPONINI 0.12*   ------------------------------------------------------------------------------------------------------------------  RADIOLOGY:  Ct Abdomen Pelvis W Contrast  Result Date: 01/09/2019 CLINICAL DATA:  Patient seen and examined, no acute events or new complaints overnight. Patient denies any abdominal pain, nausea, or emesis. Continues to have significant stool output (APPROX.3L per chart review), partially due to the bowel prep he received overnight EXAM: CT ABDOMEN AND  PELVIS WITH CONTRAST TECHNIQUE: Multidetector CT imaging of the abdomen and pelvis was performed using the standard protocol following bolus administration of intravenous contrast. CONTRAST:  140m OMNIPAQUE IOHEXOL 300 MG/ML  SOLN COMPARISON:  CT, 01/05/2019 FINDINGS: Lower chest: Small bilateral pleural effusions associated with dependent atelectasis. Left pleural effusion is new and right pleural effusion increased the prior study. Hepatobiliary: No focal liver abnormality is seen. No gallstones, gallbladder wall thickening, or biliary dilatation. Pancreas: Unremarkable. No pancreatic ductal dilatation or surrounding inflammatory changes. Spleen: Normal in size without focal abnormality. Adrenals/Urinary Tract: No adrenal masses. Left greater than right renal cortical thinning. Stable low-density renal masses consistent with cysts. No stones. No hydronephrosis. Ureters normal in course and in caliber. There are numerous bladder stones. Nondependent air within the bladder. Stomach/Bowel: There is a transition point in the mid sigmoid colon between collapsed bowel, with an ill-defined wall multiple diverticula and distended colon with air-fluid levels. Maximum distension is of the cecum measuring 10 cm. There is a small amount of free air as noted on the prior CT. Mild inflammatory changes are noted adjacent to the decompressed portion of the sigmoid colon. The findings suggest diverticulitis with inflammation leading to partial colonic obstruction. Could not exclude neoplasm. Stomach is unremarkable. There is mild distention of loops of small bowel with air-fluid levels. No small bowel wall thickening. Vascular/Lymphatic: Mild aortic atherosclerosis no aneurysm. No enlarged lymph nodes. Reproductive: Prostate mildly enlarged. Other: Diffuse subcutaneous soft tissue edema. No ascites. No abdominal wall hernia. Musculoskeletal: No fracture or acute finding. No osteoblastic or osteolytic lesions. IMPRESSION: 1. Small  amount of free intraperitoneal air is again noted. This has not increased. 2. Partial colonic obstruction at the level of the mid sigmoid colon. The decompressed portion of the sigmoid colon demonstrates multiple diverticula and mild adjacent inflammation. Suspect diverticulitis with inflammation causing the partial obstruction. Neoplastic disease is not excluded. 3. No evidence of an abscess. 4. There is significant colonic distention with mild dilation of small bowel. Maximum colonic distention is the cecum at 10 cm. The degree of distention of bowel is similar to the prior exam. 5. New left and increased right pleural effusions with associated dependent atelectasis. 6. Diffuse soft tissue edema similar to the recent prior CT. Findings consistent with anasarca. 7. 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 Electronically Signed   By: DLajean ManesM.D.   On: 01/09/2019 15:20    EKG:   Orders placed or performed during the hospital encounter of 01/05/19  . ED EKG within 10 minutes  . ED EKG within 10 minutes  . EKG 12-Lead  . EKG 12-Lead    ASSESSMENT AND PLAN:   William Holland is a 73y.o. 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  1.  Acute CHF exacerbation-no prior history of CHF-no known history of  CHF. -Echocardiogram with EF of 30 to 35% and diffuse hypokinesis.  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 -Appreciate cardiology input.  High risk for surgery given his cardiac condition.    - Strict intake and output monitoring - daily weights -Hopefully can start Lasix prior to discharge.  may be tomorrow.  2.  Perforation of bowel-known history of significant diverticulosis.  Last colonoscopy in March  2019 -Likely diverticular perforation.  Nontoxic-appearing, no signs of peritonitis  -Appreciate surgical consult.   -Due to high perioperative risk, plans to operate were canceled.  Repeat CT showing small amount of intraperitoneal air which is stable and no contrast extravasation.  Likely healing. -Management per surgery.   -Partial colonic obstruction secondary to inflammation associated with diverticulitis.   -Also has multiple stones in the bladder and evidence of chronic cystitis.    -Tolerating clear liquid diet well, advance to full liquid today.  Change empiric Zosyn to oral Cipro and Flagyl today.  3.  Elevated troponin-stable.  Appreciate cardiology input.  Diffuse hypokinesis on echocardiogram.  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  Hypokalemia-replace aggressively especially with GI losses. -Pharmacy consult for electrolyte management  5.  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   -Improving LFTs. -Liver looks all right on ultrasound.  6.  Hypothyroidism-on oral Synthroid  7.  DVT prophylaxis-on Lovenox  Physical therapy consult today   All the records are reviewed and case discussed with Care Management/Social Workerr. Management plans discussed with the patient, family and they are in agreement.  CODE STATUS: Full code  TOTAL TIME TAKING CARE OF THIS PATIENT: 39 minutes.   POSSIBLE D/C IN 1-2 DAYS, DEPENDING ON CLINICAL CONDITION.   Gladstone Lighter M.D on 01/11/2019 at 8:29 AM  Between 7am to 6pm - Pager - [phone]  After 6pm go to [email or web address] - password [email or web address] [facility]  Office  [phone]  CC: Primary care physician; Novella Rob, FNP

## 2019-01-11 NOTE — Progress Notes (Signed)
15 beat run of V-tach.  Patient is asymptomatic.  Dr. Tressia Miners notified.  No change in orders at this time.

## 2019-01-12 LAB — CBC
HCT: 32.3 % — ABNORMAL LOW (ref 39.0–52.0)
Hemoglobin: 10 g/dL — ABNORMAL LOW (ref 13.0–17.0)
MCH: 26.2 pg (ref 26.0–34.0)
MCHC: 31 g/dL (ref 30.0–36.0)
MCV: 84.6 fL (ref 80.0–100.0)
NRBC: 0 % (ref 0.0–0.2)
Platelets: 148 10*3/uL — ABNORMAL LOW (ref 150–400)
RBC: 3.82 MIL/uL — AB (ref 4.22–5.81)
RDW: 17.1 % — ABNORMAL HIGH (ref 11.5–15.5)
WBC: 6.1 10*3/uL (ref 4.0–10.5)

## 2019-01-12 LAB — BASIC METABOLIC PANEL
Anion gap: 2 — ABNORMAL LOW (ref 5–15)
BUN: 16 mg/dL (ref 8–23)
CHLORIDE: 104 mmol/L (ref 98–111)
CO2: 33 mmol/L — ABNORMAL HIGH (ref 22–32)
Calcium: 7.6 mg/dL — ABNORMAL LOW (ref 8.9–10.3)
Creatinine, Ser: 0.93 mg/dL (ref 0.61–1.24)
GFR calc Af Amer: >60 mL/min (ref >60)
GFR calc non Af Amer: >60 mL/min (ref >60)
Glucose, Bld: 90 mg/dL (ref 70–99)
Potassium: 4 mmol/L (ref 3.5–5.1)
Sodium: 139 mmol/L (ref 135–145)

## 2019-01-12 LAB — MAGNESIUM: Magnesium: 1.8 mg/dL (ref 1.7–2.4)

## 2019-01-12 MED ORDER — SODIUM CHLORIDE 0.9 % IV BOLUS
250.0000 mL | Freq: Once | INTRAVENOUS | Status: AC
  Administered 2019-01-12: 250 mL via INTRAVENOUS

## 2019-01-12 MED ORDER — LEVOTHYROXINE SODIUM 50 MCG PO TABS
50.0000 ug | ORAL_TABLET | Freq: Every day | ORAL | Status: DC
  Administered 2019-01-13: 50 ug via ORAL
  Filled 2019-01-12: qty 1

## 2019-01-12 MED ORDER — FUROSEMIDE 20 MG PO TABS
20.0000 mg | ORAL_TABLET | Freq: Every day | ORAL | Status: DC
  Administered 2019-01-12: 20 mg via ORAL
  Filled 2019-01-12: qty 1

## 2019-01-12 MED ORDER — RISAQUAD PO CAPS
1.0000 | ORAL_CAPSULE | Freq: Every day | ORAL | Status: DC
  Administered 2019-01-12 – 2019-01-13 (×2): 1 via ORAL
  Filled 2019-01-12 (×2): qty 1

## 2019-01-12 MED ORDER — POTASSIUM CHLORIDE CRYS ER 20 MEQ PO TBCR
20.0000 meq | EXTENDED_RELEASE_TABLET | Freq: Every day | ORAL | Status: DC
  Administered 2019-01-12: 20 meq via ORAL
  Filled 2019-01-12: qty 1

## 2019-01-12 MED ORDER — LOPERAMIDE HCL 2 MG PO CAPS
2.0000 mg | ORAL_CAPSULE | Freq: Four times a day (QID) | ORAL | Status: DC | PRN
  Administered 2019-01-12 – 2019-01-13 (×2): 2 mg via ORAL
  Filled 2019-01-12 (×2): qty 1

## 2019-01-12 NOTE — Progress Notes (Addendum)
[facility]ESS NOTE (cpt [phone])  Hospital Day(s): 7  Interval History: Patient seen and examined, no acute events or new complaints overnight. Patient denies any abdominal pain, nausea, or emesis. Has tolerated advancement of diet to this point. No fever or chills. No worsening distension. Continues to have significant stool output, magnesium citrate discontinued over weekend given cancelled procedure and no further need for bowel prep.   Review of Systems:  Constitutional: denies fever, chills  Respiratory: denies any shortness of breath  Cardiovascular: denies chest pain or palpitations  Gastrointestinal: denies abdominal pain, N/V, or diarrhea/and bowel function as per interval history   Vital signs in last 24 hours: [min-max] current  Temp:  [97.5 F (36.4 C)-98.3 F (36.8 C)] 97.5 F (36.4 C) (01/20 0815) Pulse Rate:  [67-79] 70 (01/20 0815) Resp:  [16-20] 16 (01/20 0815) BP: (92-105)/(43-68) 105/56 (01/20 0815) SpO2:  [91 %-98 %] 96 % (01/20 0815) Weight:  [73.8 kg] 73.8 kg (01/20 0404)     Height: 5' 9"  (175.3 cm) Weight: 73.8 kg BMI (Calculated): 24   Intake/Output this shift:  No intake/output data recorded.   Intake/Output last 2 shifts:  @IOLAST2SHIFTS @   Physical Exam:  Constitutional: alert, cooperative and no distress  HENT: normocephalic without obvious abnormality  Eyes: EOM's grossly intact and symmetric  Respiratory: breathing non-labored at rest  Gastrointestinal: soft, non-tender, and non-distended. Musculoskeletal: + 2 pitting edema bilateral lower extremities extending to knees   Labs:  CBC Latest Ref Rng & Units 01/12/2019 01/10/2019 01/07/2019  WBC 4.0 - 10.5 K/uL 6.1 7.4 9.8  Hemoglobin 13.0 - 17.0 g/dL 10.0(L) 10.3(L) 11.0(L)  Hematocrit 39.0 - 52.0 % 32.3(L) 33.9(L) 36.3(L)  Platelets 150 - 400 K/uL 148(L) 157 172   CMP Latest Ref Rng & Units 01/12/2019 01/11/2019 01/10/2019  Glucose 70 - 99 mg/dL 90 94 -  BUN 8 -  23 mg/dL 16 17 -  Creatinine 0.61 - 1.24 mg/dL 0.93 1.04 -  Sodium 135 - 145 mmol/L 139 141 -  Potassium 3.5 - 5.1 mmol/L 4.0 3.5 3.4(L)  Chloride 98 - 111 mmol/L 104 105 -  CO2 22 - 32 mmol/L 33(H) 34(H) -  Calcium 8.9 - 10.3 mg/dL 7.6(L) 7.4(L) -  Total Protein 6.5 - 8.1 g/dL - - -  Total Bilirubin 0.3 - 1.2 mg/dL - - -  Alkaline Phos 38 - 126 U/L - - -  AST 15 - 41 U/L - - -  ALT 0 - 44 U/L - - -     Assessment/Plan: (ICD-10's: K63.1) 73 y.o. male who is hemodynamically stable with known (likely chronic) bowel perforation and colonic distension most likely secondary to sigmoid colonic stricture with newly diagnosed CHF and hypothyroidism, complicated by pertinent comorbidities including chronic diarrhea, intellectual slowing, and lack of previous medical care.   - Advance to Heart Healthy Diet as tolerates  - Continue to monitor abdominal examination and on-going bowel function  - Continue oral ABx (Cipro/Flagyl), pain control PRN, antiemetics PRN   - Recommend Imodium and fiber supplementation for diarrhea management  - Recommend increasing levothyroxine to 50 mcg/day, as the IV dose is about 50% of PO. He will likely require 75-100 mcg/day given hypothyroidism initially seen on labs in July 2019.   - Medical management per medical team otherwise.    - General surgery will sign off, please re-consult if new issues arise  All of the above findings and recommendations were discussed with the patient, and the medical team, and all of  patient's and medical team's questions were answered to their expressed satisfaction.  -- [name], PA-C [facility] 01/12/2019, 8:29 AM [phone] M-F: 7am - 4pm  I saw and evaluated the patient.  I agree with the above documentation, exam, and plan, which I have edited where appropriate. [name]  8:49 AM

## 2019-01-12 NOTE — Progress Notes (Signed)
[place] [facility] [place] NAME: William Holland    MR#:  [id number]  DATE OF BIRTH:  1946/03/14  SUBJECTIVE:  CHIEF COMPLAINT:   Chief Complaint  Patient presents with  . Leg Swelling  . Weakness   -Denies any complaints.  Continues to have diarrhea -Urine with some sediment in it and brown  REVIEW OF SYSTEMS:  Review of Systems  Constitutional: Positive for malaise/fatigue. Negative for chills and fever.  HENT: Negative for congestion, ear discharge, hearing loss and nosebleeds.   Eyes: Negative for blurred vision and double vision.  Respiratory: Negative for cough, shortness of breath and wheezing.   Cardiovascular: Positive for leg swelling. Negative for chest pain and palpitations.  Gastrointestinal: Positive for diarrhea. Negative for abdominal pain, constipation, nausea and vomiting.  Genitourinary: Negative for dysuria.  Musculoskeletal: Negative for myalgias.  Neurological: Negative for dizziness, focal weakness, seizures and headaches.  Psychiatric/Behavioral: Negative for depression.    DRUG ALLERGIES:  No Known Allergies  VITALS:  Blood pressure (!) 105/56, pulse 70, temperature (!) 97.5 F (36.4 C), temperature source Oral, resp. rate 16, height 5' 9"  (1.753 m), weight 73.8 kg, SpO2 96 %.  PHYSICAL EXAMINATION:  Physical Exam  GENERAL:  73 y.o.-year-old patient lying in the bed with no acute distress.  EYES: Pupils equal, round, reactive to light and accommodation. No scleral icterus. Extraocular muscles intact.  HEENT: Head atraumatic, normocephalic. Oropharynx and nasopharynx clear.  NECK:  Supple, no jugular venous distention. No thyroid enlargement, no tenderness.  LUNGS: Normal breath sounds bilaterally, no wheezing, rales,rhonchi or crepitation. No use of accessory muscles of respiration.  Decreased bibasilar breath sounds CARDIOVASCULAR: S1, S2 normal. No  rubs, or gallops.  2/6 systolic murmur is present ABDOMEN:  Abdomen is soft and distended but nontender, hypoactive bowel sounds present. No organomegaly or mass.  -Significant scrotal and penile edema noted EXTREMITIES: No  cyanosis, or clubbing.  1-2+ lower extremity edema noted NEUROLOGIC: Cranial nerves II through XII are intact. Muscle strength 5/5 in all extremities. Sensation intact. Gait not checked.  PSYCHIATRIC: The patient is alert and oriented x 3. Slow stuttered speech at times. Slow intellectually per family. SKIN: No obvious rash, lesion, or ulcer.    LABORATORY PANEL:   CBC Recent Labs  Lab 01/12/19 0439  WBC 6.1  HGB 10.0*  HCT 32.3*  PLT 148*   ------------------------------------------------------------------------------------------------------------------  Chemistries  Recent Labs  Lab 01/08/19 0450  01/12/19 0439  NA 149*   < > 139  K 4.1   < > 4.0  CL 106   < > 104  CO2 34*   < > 33*  GLUCOSE 65*   < > 90  BUN 20   < > 16  CREATININE 1.18   < > 0.93  CALCIUM 7.7*   < > 7.6*  MG  --    < > 1.8  AST 83*  --   --   ALT 73*  --   --   ALKPHOS 59  --   --   BILITOT 1.6*  --   --    < > = values in this interval not displayed.   ------------------------------------------------------------------------------------------------------------------  Cardiac Enzymes Recent Labs  Lab 01/06/19 0421  TROPONINI 0.12*   ------------------------------------------------------------------------------------------------------------------  RADIOLOGY:  No results found.  EKG:   Orders placed or performed during the hospital encounter of 01/05/19  . ED EKG within 10 minutes  . ED EKG within 10 minutes  . EKG  12-Lead  . EKG 12-Lead    ASSESSMENT AND PLAN:   Dahir Ayer  is a 73 y.o. 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  1.  Acute CHF exacerbation-no prior history of CHF-no known history of CHF. -Echocardiogram with EF of 30 to 35% and diffuse hypokinesis.  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 -Appreciate cardiology input.  High risk for surgery given his cardiac condition.    - Strict intake and output monitoring - daily weights -Low-dose Lasix started today  2.  Perforation of bowel-known history of significant diverticulosis.  Last colonoscopy in March 2019 -Likely diverticular perforation.  Also might have enterovesicular fistula. Nontoxic-appearing, no signs of peritonitis  -Appreciate surgical consult.   -Due to high perioperative risk, plans to operate were canceled.  Repeat CT showing small amount of intraperitoneal air which is stable and no contrast extravasation.  Likely healing. -Management per surgery.   -Partial colonic obstruction secondary to inflammation associated with diverticulitis.   -Also has multiple stones in the bladder and evidence of chronic cystitis.    -Tolerating liquid diet well, advance to soft diet today. -Antibiotics changed to oral Cipro and Flagyl at this time.  3.  Elevated troponin-stable.  Appreciate cardiology input.  Diffuse hypokinesis on echocardiogram.  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  Hypokalemia-continue to replace with GI losses. -Pharmacy consult for electrolyte management  5.  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   -Improving LFTs. -Liver looks all right on ultrasound.  6.  Hypothyroidism-on oral Synthroid  7.  DVT prophylaxis-on Lovenox  Physical therapy consulted Home health at discharge   All the records are reviewed and case discussed with Care Management/Social Workerr. Management plans discussed with the patient, family and they are in agreement.  CODE STATUS: Full  code  TOTAL TIME TAKING CARE OF THIS PATIENT: 37 minutes.   POSSIBLE D/C tomorrow, DEPENDING ON CLINICAL CONDITION.   Gladstone Lighter M.D on 01/12/2019 at 8:27 AM  Between 7am to 6pm - Pager - [phone]  After 6pm go to [email or web address] - password [email or web address] [facility]  Office  [phone]  CC: Primary care physician; Novella Rob, FNP

## 2019-01-12 NOTE — Care Management (Signed)
Barrier- bowel perforation in patient with many co morbidities. Surgery cancelled due to high surgical risk. CT shows may be healing. Liquid diet and advance to soft

## 2019-01-12 NOTE — Care Management Important Message (Signed)
Copy of signed Medicare IM left with patient in room. 

## 2019-01-12 NOTE — Consult Note (Signed)
PHARMACY CONSULT NOTE - FOLLOW UP  Pharmacy Consult for Electrolyte Monitoring and Replacement   Recent Labs: Potassium (mmol/L)  Date Value  01/12/2019 4.0   Magnesium (mg/dL)  Date Value  01/12/2019 1.8   Calcium (mg/dL)  Date Value  01/12/2019 7.6 (L)   Albumin (g/dL)  Date Value  01/08/2019 2.5 (L)   Phosphorus (mg/dL)  Date Value  01/06/2019 3.2   Sodium (mmol/L)  Date Value  01/12/2019 139     Assessment: 73 y.o. 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 Patient says on an average that he has anywhere between 4-10 stools every day.  CT of the abdomen revealing possible bowel perforation and free air in the abdomen. Patient is cdiff (-), but continues to have diarrhea.  Goal of Therapy:  K ~ 4.0 Mg ~ 2.0 Phos 2.5 - 4.5  Plan:  Electrolytes WNL. Diarrhea is improving and patient is tolerating CLD, advanced to full liquid yesterday. MD has ordered additional potassium replacement this morning. Pharmacy will sign off to avoid duplicating replacement orders. Please re-consult if further assistance is desired.  Leydi Winstead A. Jordan Hawks, PharmD, BCPS Clinical Pharmacist 01/12/2019

## 2019-01-12 NOTE — Progress Notes (Signed)
BP 79/40, HR 79.  Repeat BP 80/34. Spoke with Dr. Tressia Miners.  Lasix discontinued.  250 ml bolus of NS over 2 hours ordered and started.

## 2019-01-13 LAB — BASIC METABOLIC PANEL
Anion gap: 4 — ABNORMAL LOW (ref 5–15)
BUN: 18 mg/dL (ref 8–23)
CALCIUM: 7.9 mg/dL — AB (ref 8.9–10.3)
CO2: 31 mmol/L (ref 22–32)
Chloride: 103 mmol/L (ref 98–111)
Creatinine, Ser: 1.17 mg/dL (ref 0.61–1.24)
GFR calc Af Amer: >60 mL/min (ref >60)
GFR calc non Af Amer: >60 mL/min (ref >60)
Glucose, Bld: 90 mg/dL (ref 70–99)
Potassium: 4.4 mmol/L (ref 3.5–5.1)
Sodium: 138 mmol/L (ref 135–145)

## 2019-01-13 LAB — MAGNESIUM: Magnesium: 2 mg/dL (ref 1.7–2.4)

## 2019-01-13 MED ORDER — LOPERAMIDE HCL 2 MG PO CAPS: 2.0000 mg | ORAL_CAPSULE | Freq: Four times a day (QID) | ORAL | 0 refills | Status: DC | PRN

## 2019-01-13 MED ORDER — RISAQUAD PO CAPS: 1.0000 | ORAL_CAPSULE | Freq: Every day | ORAL | 0 refills | Status: DC

## 2019-01-13 MED ORDER — TAMSULOSIN HCL 0.4 MG PO CAPS: 0.4000 mg | ORAL_CAPSULE | Freq: Every day | ORAL | 2 refills | Status: DC

## 2019-01-13 MED ORDER — LEVOTHYROXINE SODIUM 50 MCG PO TABS: 50.0000 ug | ORAL_TABLET | Freq: Every day | ORAL | 2 refills | Status: DC

## 2019-01-13 NOTE — Plan of Care (Signed)
Frequent dark, brown liquid stools continue.  Lomotil given as ordered.

## 2019-01-13 NOTE — Plan of Care (Signed)
  Problem: Education: Goal: Ability to demonstrate management of disease process will improve Outcome: Adequate for Discharge Goal: Ability to verbalize understanding of medication therapies will improve Outcome: Adequate for Discharge Goal: Individualized Educational Video(s) Outcome: Adequate for Discharge   Problem: Activity: Goal: Capacity to carry out activities will improve Outcome: Adequate for Discharge   Problem: Cardiac: Goal: Ability to achieve and maintain adequate cardiopulmonary perfusion will improve Outcome: Adequate for Discharge   Problem: Education: Goal: Knowledge of General Education information will improve Description Including pain rating scale, medication(s)/side effects and non-pharmacologic comfort measures Outcome: Adequate for Discharge   Problem: Health Behavior/Discharge Planning: Goal: Ability to manage health-related needs will improve Outcome: Adequate for Discharge   Problem: Clinical Measurements: Goal: Ability to maintain clinical measurements within normal limits will improve Outcome: Adequate for Discharge Goal: Will remain free from infection Outcome: Adequate for Discharge Goal: Diagnostic test results will improve Outcome: Adequate for Discharge Goal: Respiratory complications will improve Outcome: Adequate for Discharge Goal: Cardiovascular complication will be avoided Outcome: Adequate for Discharge   Problem: Activity: Goal: Risk for activity intolerance will decrease Outcome: Adequate for Discharge   Problem: Nutrition: Goal: Adequate nutrition will be maintained Outcome: Adequate for Discharge   Problem: Elimination: Goal: Will not experience complications related to bowel motility Outcome: Adequate for Discharge

## 2019-01-13 NOTE — Progress Notes (Signed)
Patient's brother Ida Uppal notified of patients discharge. Will go over discharge information when he gets here.

## 2019-01-13 NOTE — Progress Notes (Signed)
Patient given discharge instructions with brother at bedside. Patient and brother verbalized understanding with no further questions or concerns. IV taken out and tele monitor off. Patient going home in stable condition.

## 2019-01-13 NOTE — Care Management Note (Signed)
Case Management Note  Patient Details  Name: William Holland MRN: [id number] Date of Birth: June 18, 1946  Subjective/Objective:   Patient is from home with nephew.  He was admitted with hypokalemia, bowel perforation, elevated troponin, elevated LFTs, new onset of congestive heart failure.  He has a functioning scale and weighs occasionally.  Educated patient on importance of daily weights.  He lives with his nephew who works full time.  Patient does not drive.  His brother is able to take him to appointments.  Has a heart failure clinic appointment.  Brother will pick up this afternoon; his phone number is [phone].   Offered home health services and patient does not have a preference.  Made referral to [facility] for RN, PT and ReDS Vest and accepted.  Please contact brother William Holland to set up home health services as patient does not have a phone and nephew will be at work during the day.   Denies difficulties obtaining medications or with accessing medical care.  No further needs identified at this time.                  Action/Plan:   Expected Discharge Date:  01/13/19               Expected Discharge Plan:  [place]  In-House Referral:     Discharge planning Services  CM Consult  Post Acute Care Choice:  Home Health Choice offered to:  Patient, Sibling  DME Arranged:    DME Agency:     HH Arranged:  RN, PT(REDS Vest) [place]:  [facility] (now [facility])  Status of Service:  Completed, signed off  If discussed at [employer] of Stay Meetings, dates discussed:    Additional Comments:  Elza Rafter, RN 01/13/2019, 10:26 AM

## 2019-01-13 NOTE — Discharge Summary (Signed)
[place] [facility] [place] NAME: William Holland    MR#:  [id number]  DATE OF BIRTH:  21-Nov-1946  DATE OF ADMISSION:  01/05/2019   ADMITTING PHYSICIAN: Gladstone Lighter, MD  DATE OF DISCHARGE:  01/13/19  PRIMARY CARE PHYSICIAN: Novella Rob, FNP   ADMISSION DIAGNOSIS:   Hypokalemia [E87.6] Bowel perforation ([place]) [K63.1] Elevated troponin [R79.89] Elevated LFTs [R94.5] New onset of congestive heart failure ([place]) [I50.9]  DISCHARGE DIAGNOSIS:   Active Problems:   CHF exacerbation (HCC)   Pressure injury of skin   Acute systolic heart failure (HCC)   Hypokalemia   Bowel perforation ([place])   SECONDARY DIAGNOSIS:   Past Medical History:  Diagnosis Date  . Diverticulosis, sigmoid 02/27/2018   Colonoscopy done on 02/27/2018 by Dr. Marius Ditch  . 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  1. Acute CHF exacerbation-no prior history of CHF-no known history of CHF. -Echocardiogram with EF of 30 to 35% and diffuse hypokinesis.  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 -Appreciate cardiology input.  High risk for surgery given his cardiac condition.    - Strict intake and output monitoring - daily weights -When tried to use low-dose Lasix in the hospital, patient dropped his blood pressure that required fluid bolus.  At this time hold off on diuretics at discharge is losing plenty of fluids through diarrhea. -Would recommend TED stockings to lower extremity edema  2. Perforation of bowel-known history of significant diverticulosis. Last colonoscopy in March 2019 -Likely diverticular perforation.  Also might have  enterovesicular fistula.Nontoxic-appearing, no signs of peritonitis  -Appreciate surgical consult.   -Due to high perioperative risk, plans to operate were canceled.  Repeat CT showing small amount of intraperitoneal air which is stable and no contrast extravasation.  Likely healing.  -Partial colonic obstruction secondary to inflammation associated with diverticulitis.   -Also has multiple stones in the bladder and evidence of chronic cystitis.    -Seems Zosyn which was changed to Cipro and Flagyl and antibiotics stopped at discharge. -We will follow-up with general surgery and urology as outpatient.  Patient able to tolerate regular diet prior to discharge.  3.  Elevated troponin-stable.  Appreciate cardiology input.  Diffuse hypokinesis on echocardiogram.  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 Hypokalemia-continue to replace with GI losses. Potassium is much improved.  Follow-up within 1 week as outpatient  5. 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  -Improving LFTs. -Liver looks all right on ultrasound.  6.  Hypothyroidism-on oral Synthroid  Physical therapy consulted Home health at discharge  DISCHARGE CONDITIONS:   Guarded  CONSULTS OBTAINED:   Treatment Team:  [name], MD Jules Husbands, MD Billey Co, MD  DRUG ALLERGIES:   No Known Allergies DISCHARGE MEDICATIONS:   Allergies as of 01/13/2019   No Known Allergies     Medication List    TAKE these medications   acidophilus Caps capsule Take 1 capsule by mouth daily. Start taking on:  January 14, 2019   levothyroxine 50 MCG tablet Commonly known as:  SYNTHROID, LEVOTHROID Take 1 tablet (50 mcg total) by mouth daily before breakfast. Start taking on:  January 14, 2019   loperamide 2 MG capsule Commonly known as:  IMODIUM Take 1 capsule (2 mg total) by mouth every 6 (six) hours as needed for  diarrhea or loose stools.   tamsulosin 0.4 MG Caps capsule Commonly known as:  FLOMAX Take 1 capsule (0.4 mg total) by mouth daily. What changed:    how much to take  how to take this  when to take this        DISCHARGE INSTRUCTIONS:   1.  PCP follow-up in 1 to 2 weeks 2.  Urology follow-up in 2 weeks 3.  Cardiology follow-up in 1 to 2 weeks 4.  General surgery follow-up in 2 to 3 weeks  DIET:   Cardiac diet  ACTIVITY:   Activity as tolerated  OXYGEN:   Home Oxygen: No.  Oxygen Delivery: room air  DISCHARGE LOCATION:   home   If you experience worsening of your admission symptoms, develop shortness of breath, life threatening emergency, suicidal or homicidal thoughts you must seek medical attention immediately by calling 911 or calling your MD immediately  if symptoms less severe.  You Must read complete instructions/literature along with all the possible adverse reactions/side effects for all the Medicines you take and that have been prescribed to you. Take any new Medicines after you have completely understood and accpet all the possible adverse reactions/side effects.   Please note  You were cared for by a hospitalist during your hospital stay. If you have any questions about your discharge medications or the care you received while you were in the hospital after you are discharged, you can call the unit and asked to speak with the hospitalist on call if the hospitalist that took care of you is not available. Once you are discharged, your primary care physician will handle any further medical issues. Please note that NO REFILLS for any discharge medications will be authorized once you are discharged, as it is imperative that you return to your primary care physician (or establish a relationship with a primary care physician if you do not have one) for your aftercare needs so that they can reassess your need for medications and monitor your lab values.    On the  day of Discharge:  VITAL SIGNS:   Blood pressure (!) 98/43, pulse 83, temperature 97.8 F (36.6 C), temperature source Oral, resp. rate 18, height 5' 9"  (1.753 m), weight 73.4 kg, SpO2 95 %.  PHYSICAL EXAMINATION:    GENERAL:73 y.o.-year-old patient lying in the bed with no acute distress.  EYES: Pupils equal, round, reactive to light and accommodation. No scleral icterus. Extraocular muscles intact.  HEENT: Head atraumatic, normocephalic. Oropharynx and nasopharynx clear.  NECK: Supple, no jugular venous distention. No thyroid enlargement, no tenderness.  LUNGS: Normal breath sounds bilaterally, no wheezing, rales,rhonchi or crepitation. No use of accessory muscles of respiration.Decreased bibasilar breath sounds CARDIOVASCULAR: S1, S2 normal. No rubs, or gallops. 2/6 systolic murmur is present ABDOMEN:Abdomen is soft and distended but nontender, hypoactive bowel sounds present. No organomegaly or mass.  -Significant scrotal and penile edema noted EXTREMITIES: No cyanosis, or clubbing. 1-2+ lower extremity edema noted NEUROLOGIC: Cranial nerves II through XII are intact. Muscle strength 5/5 in all extremities. Sensation intact. Gait not checked.  PSYCHIATRIC: The patient is alert and oriented x 3.Slow stuttered speech at times. Slow intellectually per family. SKIN: No obvious rash, lesion, or ulcer.   DATA REVIEW:   CBC Recent Labs  Lab 01/12/19 0439  WBC 6.1  HGB 10.0*  HCT 32.3*  PLT 148*    Chemistries  Recent Labs  Lab 01/08/19 0450  01/13/19 0513  NA 149*   < > 138  K 4.1   < > 4.4  CL 106   < > 103  CO2 34*   < > 31  GLUCOSE 65*   < > 90  BUN 20   < > 18  CREATININE 1.18   < > 1.17  CALCIUM 7.7*   < > 7.9*  MG  --    < > 2.0  AST 83*  --   --   ALT 73*  --   --   ALKPHOS 59  --   --   BILITOT 1.6*  --   --    < > = values in this interval not displayed.     Microbiology Results  Results for orders placed or performed during the hospital  encounter of 01/05/19  Gastrointestinal Panel by PCR , Stool     Status: None   Collection Time: 01/05/19 12:11 PM  Result Value Ref Range Status   Campylobacter species NOT DETECTED NOT DETECTED Final   Plesimonas shigelloides NOT DETECTED NOT DETECTED Final   Salmonella species NOT DETECTED NOT DETECTED Final   Yersinia enterocolitica NOT DETECTED NOT DETECTED Final   Vibrio species NOT DETECTED NOT DETECTED Final   Vibrio cholerae NOT DETECTED NOT DETECTED Final   Enteroaggregative E coli (EAEC) NOT DETECTED NOT DETECTED Final   Enteropathogenic E coli (EPEC) NOT DETECTED NOT DETECTED Final   Enterotoxigenic E coli (ETEC) NOT DETECTED NOT DETECTED Final   Shiga like toxin producing E coli (STEC) NOT DETECTED NOT DETECTED Final   Shigella/Enteroinvasive E coli (EIEC) NOT DETECTED NOT DETECTED Final   Cryptosporidium NOT DETECTED NOT DETECTED Final   Cyclospora cayetanensis NOT DETECTED NOT DETECTED Final   Entamoeba histolytica NOT DETECTED NOT DETECTED Final   Giardia lamblia NOT DETECTED NOT DETECTED Final   Adenovirus F40/41 NOT DETECTED NOT DETECTED Final   Astrovirus NOT DETECTED NOT DETECTED Final   Norovirus GI/GII NOT DETECTED NOT DETECTED Final   Rotavirus A NOT DETECTED NOT DETECTED Final   Sapovirus (I, II, IV, and V) NOT DETECTED NOT DETECTED Final    Comment: Performed at [facility], [address] [place], [place] [address]  C difficile quick scan w PCR reflex     Status: None   Collection Time: 01/05/19 12:44 PM  Result Value Ref Range Status   C Diff antigen NEGATIVE NEGATIVE Final   C Diff toxin NEGATIVE NEGATIVE Final   C Diff interpretation No C. difficile detected.  Final    Comment: Performed at [facility], [address] [place], [place] [address]  Blood culture (routine x 2)     Status: None   Collection Time: 01/05/19  1:43 PM  Result Value Ref Range Status   Specimen Description BLOOD RIGHT ARM  Final   Special Requests   Final     BOTTLES DRAWN AEROBIC AND ANAEROBIC Blood Culture adequate volume   Culture   Final    NO GROWTH 5 DAYS Performed at [facility], [address] [place], [place] [address]    Report Status 01/10/2019 FINAL  Final  Blood culture (routine x 2)     Status: None   Collection Time: 01/05/19  1:48 PM  Result Value Ref Range Status   Specimen Description BLOOD RIGHT ANTECUBITAL  Final   Special Requests   Final  BOTTLES DRAWN AEROBIC AND ANAEROBIC Blood Culture results may not be optimal due to an excessive volume of blood received in culture bottles   Culture   Final    NO GROWTH 5 DAYS Performed at [name], [address] [place], [place] [address]    Report Status 01/10/2019 FINAL  Final  Urine Culture     Status: None   Collection Time: 01/05/19  6:12 PM  Result Value Ref Range Status   Specimen Description   Final    URINE, RANDOM Performed at [facility], [address] [place], [place] [address]    Special Requests   Final    NONE Performed at [facility], [address], [place],  [address]    Culture   Final    Multiple bacterial morphotypes present, none predominant. Suggest appropriate recollection if clinically indicated.   Report Status 01/07/2019 FINAL  Final    RADIOLOGY:  No results found.   Management plans discussed with the patient, family and they are in agreement.  CODE STATUS:     Code Status Orders  (From admission, onward)         Start     Ordered   01/05/19 1621  Full code  Continuous     01/05/19 1620        Code Status History    Date Active Date Inactive Code Status Order ID Comments User Context   03/10/2016 0643 03/11/2016 1834 Full Code [id number]  Saundra Shelling, MD Inpatient      TOTAL TIME TAKING CARE OF THIS PATIENT: 38 minutes.    Gladstone Lighter M.D on 01/13/2019 at 9:15 AM  Between 7am to 6pm - Pager - [phone]  After 6pm go to [email or web address] - Microbiologist  [facility]  Office  [phone]  CC: Primary care physician; Novella Rob, FNP   Note: This dictation was prepared with Dragon dictation along with smaller phrase technology. Any transcriptional errors that result from this process are unintentional.

## 2019-01-18 NOTE — Progress Notes (Deleted)
   Patient ID: William Holland, male    DOB: 07/17/1946, 74 y.o.   MRN: [id number]  HPI  Mr William Holland is a 73 y/o male with a history of  Echo report from 01/06/2019 reviewed and showed an EF of 30-35% along with mild MR and increased filling pressures.   Admitted 01/05/2019 due to new onset HF. Low dose lasix had to be stopped due to hypotension which required fluid bolus. Bowel perforation but due to high surgical risk, no plans for surgery. Needs outpatient catheterization.  Cardiology, urology and surgery consults obtained. LFT's elevated but has history of alcohol use. Discharged after 8 days.   Patient presents today for his initial visit with a chief complaint of   Review of Systems    Physical Exam  Assessment & Plan:  1: Chronic heart failure with reduced ejection fraction- - NYHA class  2: Tobacco use

## 2019-01-20 ENCOUNTER — Telehealth: Payer: Self-pay | Admitting: Cardiovascular Disease

## 2019-01-20 ENCOUNTER — Ambulatory Visit: Payer: Medicare Other | Admitting: Family

## 2019-01-20 NOTE — Telephone Encounter (Signed)
Merry Proud from [facility] calling Requesting verbal orders for nursing for CHF management 2 times a week for six weeks Please call to discuss

## 2019-01-20 NOTE — Telephone Encounter (Signed)
Spoke with Merry Proud at [facility] and they want verbal orders for care. They also received order for BMP in one week and also need to send that to someone. Advised I would check with provider and then be in touch tomorrow. He was appreciative for the call.

## 2019-01-22 ENCOUNTER — Ambulatory Visit: Payer: Medicare Other | Admitting: Family

## 2019-01-22 NOTE — Progress Notes (Signed)
Patient ID: William Holland, male    DOB: [birth year], 73 y.o.   MRN: [id number]  HPI  William Holland is a 73 y/o male with a history of diverticulosis, stuttering, hypotension and chronic heart failure.   Echo report from 01/06/2019 reviewed and showed an EF of 30-35% along with mild William and increased filling pressures.   Admitted 01/05/2019 due to new onset HF. Low dose lasix had to be stopped due to hypotension which required fluid bolus. Bowel perforation but due to high surgical risk, no plans for surgery. Needs outpatient catheterization.  Cardiology, urology and surgery consults obtained. LFT's elevated but has history of alcohol use. Discharged after 8 days.   Patient presents today for his initial visit with a chief complaint of minimal fatigue upon moderate exertion. He describes this as having been present for several weeks. He has associated pedal edema, chronic diarrhea and intermittent back pain along with this. He denies any difficulty sleeping, dizziness, abdominal distention, palpitations, chest pain, shortness of breath, cough or weight gain.   Past Medical History:  Diagnosis Date  . CHF (congestive heart failure) ([place])   . Diverticulosis, sigmoid 02/27/2018   Colonoscopy done on 02/27/2018 by Dr. Marius Holland  . Stuttering during school years    Past Surgical History:  Procedure Laterality Date  . APPENDECTOMY    . COLONOSCOPY WITH PROPOFOL N/A 02/27/2018   Procedure: COLONOSCOPY WITH PROPOFOL;  Surgeon: Lin Landsman, MD;  Location[facility];  Service: Gastroenterology;  Laterality: N/A;  . EYE SURGERY     Family History  Problem Relation Age of Onset  . Cancer Brother   . Cancer Father   . Uterine cancer Mother    Social History   Tobacco Use  . Smoking status: Never Smoker  . Smokeless tobacco: Never Used  Substance Use Topics  . Alcohol use: No    Alcohol/week: 0.0 standard drinks   No Known Allergies Prior to Admission medications   Medication Sig Start Date End  Date Taking? Authorizing Provider  acidophilus (RISAQUAD) CAPS capsule Take 1 capsule by mouth daily. 01/14/19  Yes Gladstone Lighter, MD  levothyroxine (SYNTHROID, LEVOTHROID) 50 MCG tablet Take 1 tablet (50 mcg total) by mouth daily before breakfast. 01/14/19  Yes Gladstone Lighter, MD  loperamide (IMODIUM) 2 MG capsule Take 1 capsule (2 mg total) by mouth every 6 (six) hours as needed for diarrhea or loose stools. 01/13/19  Yes Gladstone Lighter, MD  tamsulosin (FLOMAX) 0.4 MG CAPS capsule Take 1 capsule (0.4 mg total) by mouth daily. 01/13/19  Yes Gladstone Lighter, MD    Review of Systems  Constitutional: Positive for fatigue (with moderate exertion). Negative for appetite change.  HENT: Positive for rhinorrhea. Negative for congestion and sore throat.   Eyes: Negative.   Respiratory: Negative for cough and shortness of breath.   Cardiovascular: Positive for leg swelling ("better"). Negative for chest pain and palpitations.  Gastrointestinal: Positive for diarrhea. Negative for abdominal distention and abdominal pain.  Endocrine: Negative.   Genitourinary: Negative.   Musculoskeletal: Positive for back pain (at times). Negative for neck pain.  Allergic/Immunologic: Negative.   Neurological: Negative for dizziness and light-headedness.  Hematological: Negative for adenopathy. Does not bruise/bleed easily.  Psychiatric/Behavioral: Negative for sleep disturbance (sleeping on 2 pillows).    Vitals:   01/23/19 0833  BP: (!) 102/57  Pulse: 95  Resp: 18  SpO2: 99%  Weight: 172 lb (78 kg)  Height: 5' 7"  (1.702 m)   Wt Readings from Last 3 Encounters:  01/23/19 172 lb (78 kg)  01/13/19 161 lb 14.4 oz (73.4 kg)  07/29/18 160 lb (72.6 kg)   Lab Results  Component Value Date   CREATININE 1.17 01/13/2019   CREATININE 0.93 01/12/2019   CREATININE 1.04 01/11/2019    Physical Exam Vitals signs and nursing note reviewed.  Constitutional:      Appearance: Normal appearance.  HENT:      Head: Normocephalic and atraumatic.  Cardiovascular:     Rate and Rhythm: Normal rate and regular rhythm.  Pulmonary:     Effort: Pulmonary effort is normal. No respiratory distress.     Breath sounds: No wheezing or rales.  Abdominal:     General: There is no distension.     Palpations: Abdomen is soft.  Musculoskeletal:        General: No tenderness.     Right lower leg: Edema (2+ pitting) present.     Left lower leg: Edema (2+ pitting) present.  Skin:    General: Skin is warm and dry.  Neurological:     Mental Status: He is alert and oriented to person, place, and time. Mental status is at baseline.  Psychiatric:        Mood and Affect: Mood normal.        Behavior: Behavior normal.    Assessment & Plan:  1: Chronic heart failure with reduced ejection fraction- - NYHA class II - euvolemic today - weighing daily; reminded to call for an overnight weight gain of >2 pounds or a weekly weight gain of >5 pounds - not adding salt and patient's brother has been reading food labels. Discussed keeping daily sodium intake to <2022m and written information was provided to him about this - BP too low today to begin entresto or ACE-I - will begin low dose metoprolol succinate 228m take 1/2 tablet daily; titrate as able - sees cardiology (D[facility]olina Primer2/13/2020 - has [facility] & looks like they drew labs on 01/20/2019 - BNP 01/05/2019 was 2927.0 - unsure if he's received the flu vaccine or not; encouraged good handwashing  2: Hypotension- - BP on the low side today; unable to begin entresto, consider ACE-I if able - was given lasix in hospital which dropped his BP requiring fluid bolus - BMP 01/13/2019 reviewed and showed sodium 138, potassium 4.4, creatinine 1.17 and GFR >60  3: Diverticulosis- - chronic diarrhea  - colonoscopy done 02/27/18 - saw GI ([place]) 07/18/18  4: Lymphedema- - stage 2 - hasn't been elevating his legs and he was encouraged to elevate them when sitting  for long periods of time - instructed to get compression socks and wear them daily with removal at bedtime - limited in his ability to exercise due to his BP - limited in ability to use diuretics due to blood pressure - consider lymphapress compression boots if edema persists  Patient did not bring his medications nor a list. Each medication was verbally reviewed with the patient and he was encouraged to bring the bottles to every visit to confirm accuracy of list.  Return in 6 weeks or sooner for any questions/problems before then.

## 2019-01-22 NOTE — Telephone Encounter (Signed)
That is fine thx TG

## 2019-01-23 ENCOUNTER — Encounter: Payer: Self-pay | Admitting: Family

## 2019-01-23 ENCOUNTER — Ambulatory Visit: Payer: Medicare Other | Attending: Family | Admitting: Family

## 2019-01-23 DIAGNOSIS — I959 Hypotension, unspecified: Secondary | ICD-10-CM | POA: Insufficient documentation

## 2019-01-23 DIAGNOSIS — K529 Noninfective gastroenteritis and colitis, unspecified: Secondary | ICD-10-CM | POA: Diagnosis not present

## 2019-01-23 DIAGNOSIS — I5022 Chronic systolic (congestive) heart failure: Secondary | ICD-10-CM | POA: Diagnosis not present

## 2019-01-23 DIAGNOSIS — Z79890 Hormone replacement therapy (postmenopausal): Secondary | ICD-10-CM | POA: Diagnosis not present

## 2019-01-23 DIAGNOSIS — I950 Idiopathic hypotension: Secondary | ICD-10-CM

## 2019-01-23 DIAGNOSIS — Z79899 Other long term (current) drug therapy: Secondary | ICD-10-CM | POA: Insufficient documentation

## 2019-01-23 DIAGNOSIS — R5383 Other fatigue: Secondary | ICD-10-CM | POA: Diagnosis present

## 2019-01-23 DIAGNOSIS — K5730 Diverticulosis of large intestine without perforation or abscess without bleeding: Secondary | ICD-10-CM | POA: Diagnosis not present

## 2019-01-23 DIAGNOSIS — I890 Lymphedema, not elsewhere classified: Secondary | ICD-10-CM | POA: Diagnosis not present

## 2019-01-23 DIAGNOSIS — K5790 Diverticulosis of intestine, part unspecified, without perforation or abscess without bleeding: Secondary | ICD-10-CM | POA: Insufficient documentation

## 2019-01-23 MED ORDER — METOPROLOL SUCCINATE ER 25 MG PO TB24: 12.5000 mg | ORAL_TABLET | Freq: Every day | ORAL | 5 refills | Status: DC

## 2019-01-23 NOTE — Patient Instructions (Addendum)
Continue weighing daily and call for an overnight weight gain of > 2 pounds or a weekly weight gain of >5 pounds.  Try to get compression socks and put them on daily with removal at bedtime.  Begin metoprolol succinate 70m tablet but you will take 1/2 tablet once daily.

## 2019-01-23 NOTE — Telephone Encounter (Signed)
William Holland stated he will be drawing the lab work this morning and then have the results faxed to our office. Fax number provided.

## 2019-01-26 ENCOUNTER — Telehealth: Payer: Self-pay | Admitting: Cardiovascular Disease

## 2019-01-26 NOTE — Telephone Encounter (Signed)
   [facility] Pre-operative Risk Assessment    Request for surgical clearance:  1. What type of surgery is being performed? BL TKA knee  2. When is this surgery scheduled? 03/13/19  3. What type of clearance is required (medical clearance vs. Pharmacy clearance to hold med vs. Both)? Note listed  4. Are there any medications that need to be held prior to surgery and how long? Not listed  5. Practice name and name of physician performing surgery?  [facility], Dr. Harlow Mares  6. What is your office phone number [phone]    7.   What is your office fax number [phone]  8.   Anesthesia type (None, local, MAC, general) ?William Holland, William Holland 01/26/2019, 8:01 AM  _________________________________________________________________   (provider comments below)

## 2019-01-26 NOTE — Telephone Encounter (Signed)
Patient is seeing William Holland on 02/05/19.

## 2019-01-27 ENCOUNTER — Telehealth: Payer: Self-pay | Admitting: Cardiovascular Disease

## 2019-01-27 DIAGNOSIS — I5022 Chronic systolic (congestive) heart failure: Secondary | ICD-10-CM

## 2019-01-27 NOTE — Telephone Encounter (Signed)
Home Health is faxing lab results to be scanned and reviewed as K+ is elevated .

## 2019-01-27 NOTE — Telephone Encounter (Signed)
Lab work scanned into lab results. Potassium was elevated to 5.7 on 01/23/19.   Discussed with Dr Fletcher Anon, DOD. He advised to have BMET drawn stat tomorrow and have the results faxed to [place]. Also, to please verify that patient is not on potassium supplement at this time.   Called [facility] and gave message to answering service to call us tomorrow morning starting around 8 am to discuss and order the stat BMET.   Called patient to verify that he is not taking potassium supplement at this time. He said he is not.  He denies any current palpitations or chest pain at this time.

## 2019-01-28 NOTE — Telephone Encounter (Signed)
Received incoming call from [facility]. She is returning call from last night where I left a message to call us. She said the BMET order was originally from patient's discharge from [facility].  Patient has not been seen in our office yet.  Saw Darylene Price, NP on 01/23/19. Dr Rockey Situ saw patient in the hospital and patient does not have appointment with [place] until 02/05/19 to see [facility].  Ordered BMET under [place] for now as advised by Dr Fletcher Anon.  She said she will put the order in stat and have someone go out to the patient's house today to draw and then fax [place] the results.

## 2019-01-29 NOTE — Telephone Encounter (Signed)
Spoke with Deneise Lever with [facility]. Was transferred to [facility]. The labs will be drawn today as stat. She has our fax number and the lab work should be faxed by the end of today.

## 2019-01-29 NOTE — Telephone Encounter (Signed)
Have not received lab work yet today. SPoke with [facility]'s answering service. They had left for the day. She will leave message to have them call us in the morning to make sure lab work gets to [place]. Routing back to triage to please check on this tomorrow.

## 2019-01-30 ENCOUNTER — Other Ambulatory Visit
Admission: RE | Admit: 2019-01-30 | Discharge: 2019-01-30 | Disposition: A | Discharge status: Home / Self Care | Payer: Medicare Other | Source: Ambulatory Visit | Attending: Cardiovascular Disease | Admitting: Cardiovascular Disease

## 2019-01-30 DIAGNOSIS — I5022 Chronic systolic (congestive) heart failure: Secondary | ICD-10-CM

## 2019-01-30 LAB — BASIC METABOLIC PANEL
Anion gap: 7 (ref 5–15)
BUN: 30 mg/dL — ABNORMAL HIGH (ref 8–23)
CHLORIDE: 109 mmol/L (ref 98–111)
CO2: 27 mmol/L (ref 22–32)
Calcium: 8.6 mg/dL — ABNORMAL LOW (ref 8.9–10.3)
Creatinine, Ser: 1.24 mg/dL (ref 0.61–1.24)
GFR calc Af Amer: >60 mL/min (ref >60)
GFR, EST NON AFRICAN AMERICAN: 58 mL/min — AB (ref >60)
Glucose, Bld: 113 mg/dL — ABNORMAL HIGH (ref 70–99)
POTASSIUM: 4.3 mmol/L (ref 3.5–5.1)
Sodium: 143 mmol/L (ref 135–145)

## 2019-01-30 NOTE — Telephone Encounter (Signed)
Incoming call from [facility], reported that labs did not result until this morning at 5 am and did not include a potassium. She reported that it could have been hemolyzed.   Lab order placed for [facility] mall today. She is arranging transportation. She reports that patient is asymptomatic at this time.   Previously resulted labs are to faxed to [place] from facility.  She will call with any other questions or concerns.

## 2019-02-02 NOTE — Telephone Encounter (Signed)
Lab work completed and patient's brother notified.

## 2019-02-03 NOTE — Progress Notes (Signed)
Cardiology Office Note Date:  02/05/2019  Patient ID:  William Holland, William Holland 04/11/1946, MRN [id number] PCP:  Acquanetta Chain, DO  Cardiologist:  Dr. Fletcher Anon, MD    Chief Complaint: Hospital follow up  History of Present Illness: William Holland is a 73 y.o. male with history of recently diagnosed systolic CHF, diverticulosis, chronic diarrhea, anemia, and intellectual slowing who presents for hospital follow-up from recent admission to [facility] from 1/13 through 1/21 for bowl perforation with known history of significant diverticulosis complicated by volume overload with acute systolic CHF.  Prior to the above admission, the patient did not have any previously known cardiac history.  He presented to the hospital with an approximate 42-monthhistory of watery diarrhea with anywhere from 4-10 stools daily.  He was found to have prominent colon distention with CT of the abdomen and pelvis showing free air suggestive of bowel perforation.  He was noted to be significantly volume overloaded.  Despite the finding of free air in the abdominal cavity he was surprisingly hemodynamically stable with not a lot of symptoms.  Initial labs showed severe hypokalemia with a potassium less than 2 even after replacement, BNP close to 3000, CPK slightly above 7000, albumin 2.9, and a flat trending troponin of 0.16.  Echo showed an EF of 30 to 35%, diffuse hypokinesis, grade 2 diastolic dysfunction, mild mitral regurgitation, moderately dilated left atrium, moderately reduced RV systolic function, trivial pericardial effusion was identified, along with a right pleural effusion.  Diuresis was initially slowed secondary to his severe hypokalemia.  Due to the patient being hemodynamically stable and his high perioperative risk his surgery was canceled.  Repeat CT of the abdomen and pelvis showed a small amount of intraperitoneal air which was stable and no contrast extravasation.  Outpatient follow-up with surgery was recommended.   Diuresis continued to be hampered by hypotension.  It was felt that given his chronic diarrhea he was losing plenty of fluids and he was not discharged with diuretics.  Discharge labs: Potassium 4.4, serum creatinine 1.17, magnesium 2.0, WBC 6.1, Hgb 10.0, PLT 148, albumin 2.5, AST 83, ALT 73, T bili 1.6  He was not discharged on any evidence-based heart failure therapies secondary to relative hypotension.  Discharge weight documented at 73.4 kg.  Outpatient BMP from 01/29/2019 showed a serum creatinine of 1.35 with a potassium not being performed on the sample secondary to intact red blood cells.  Repeat BMP on 01/30/2019 showed a serum creatinine of 1.24 with a potassium of 4.3.  [facility] with a documented weight of 172 pounds (78 kg).  BP was soft at 102/57.  He was started on Toprol-XL 12.5 mg daily.  Patient comes in accompanied by his wife and brother today.  He is doing well from a cardiac perspective.  Unfortunately, he has noted the return of watery diarrhea over the past 2 days.  He is unaware of any outpatient follow-up with GI or general surgery.  He has not been weighing himself or checking blood pressures at home.  He has been taking Toprol-XL 12.5 mg twice daily rather than daily.  He continues to note lower extremity swelling that is stable when compared to his hospital discharge per the patient and family.  He does forget to elevate his legs when sitting frequently.  When he is home alone, he does not follow a low-sodium diet.  He is not wrapping his legs with Ace wraps or using compression stockings.  He has a stable two-pillow orthopnea.  He has not had any chest pain, abdominal distention, PND, or early satiety.  No falls since his hospital discharge.  He denies any BRBPR or melena.  Past Medical History:  Diagnosis Date  . Anemia   . Bowel perforation ([place])   . Chronic diarrhea   . Chronic systolic CHF (congestive heart failure)  ([place])    a. TTE 12/2018: EF of 30 to 35%, diffuse hypokinesis, grade 2 diastolic dysfunction, mild mitral regurgitation, moderately dilated left atrium, moderately reduced RV systolic function, trivial pericardial effusion was identified, along with a right pleural effusion  . Diverticulosis, sigmoid 02/27/2018   Colonoscopy done on 02/27/2018 by Dr. Marius Ditch  . Stuttering during school years     Past Surgical History:  Procedure Laterality Date  . APPENDECTOMY    . COLONOSCOPY WITH PROPOFOL N/A 02/27/2018   Procedure: COLONOSCOPY WITH PROPOFOL;  Surgeon: Lin Landsman, MD;  Location: [facility] ENDOSCOPY;  Service: Gastroenterology;  Laterality: N/A;  . EYE SURGERY      Current Meds  Medication Sig  . acidophilus (RISAQUAD) CAPS capsule Take 1 capsule by mouth daily.  Marland Kitchen levothyroxine (SYNTHROID, LEVOTHROID) 50 MCG tablet Take 1 tablet (50 mcg total) by mouth daily before breakfast.  . loperamide (IMODIUM) 2 MG capsule Take 1 capsule (2 mg total) by mouth every 6 (six) hours as needed for diarrhea or loose stools.  . metoprolol succinate (TOPROL XL) 25 MG 24 hr tablet Take 0.5 tablets (12.5 mg total) by mouth daily. (Patient taking differently: Take 12.5 mg by mouth 2 (two) times daily. )  . tamsulosin (FLOMAX) 0.4 MG CAPS capsule Take 1 capsule (0.4 mg total) by mouth daily.    Allergies:   Patient has no known allergies.   Social History:  The patient  reports that he has never smoked. He has never used smokeless tobacco. He reports that he does not drink alcohol or use drugs.   Family History:  The patient's family history includes Cancer in his brother and father; Uterine cancer in his mother.  ROS:   Review of Systems  Constitutional: Positive for malaise/fatigue. Negative for chills, diaphoresis, fever and weight loss.  HENT: Negative for congestion.   Eyes: Negative for discharge and redness.  Respiratory: Negative for cough, hemoptysis, sputum production, shortness of breath and  wheezing.   Cardiovascular: Positive for leg swelling. Negative for chest pain, palpitations, orthopnea, claudication and PND.  Gastrointestinal: Positive for diarrhea. Negative for abdominal pain, blood in stool, constipation, heartburn, melena, nausea and vomiting.  Genitourinary: Negative for hematuria.  Musculoskeletal: Negative for falls and myalgias.  Skin: Negative for rash.  Neurological: Positive for weakness. Negative for dizziness, tingling, tremors, sensory change, speech change, focal weakness and loss of consciousness.  Endo/Heme/Allergies: Does not bruise/bleed easily.  Psychiatric/Behavioral: Negative for substance abuse. The patient is not nervous/anxious.   All other systems reviewed and are negative.    PHYSICAL EXAM:  VS:  BP 120/70 (BP Location: Left Arm, Patient Position: Sitting, Cuff Size: Normal)   Pulse 74   Ht 5' 7"  (1.702 m)   Wt 171 lb 4 oz (77.7 kg)   BMI 26.82 kg/m  BMI: Body mass index is 26.82 kg/m.  Physical Exam  Constitutional: He is oriented to person, place, and time. He appears well-developed and well-nourished.  HENT:  Head: Normocephalic and atraumatic.  Eyes: Right eye exhibits no discharge. Left eye exhibits no discharge.  Neck: Normal range of motion. JVD present.  JVD elevated  approximately 8 cm  Cardiovascular: Normal rate, regular rhythm, S1 normal and S2 normal. Exam reveals no distant heart sounds, no friction rub, no midsystolic click and no opening snap.  Murmur heard. High-pitched blowing holosystolic murmur is present with a grade of 1/6 at the apex. Pulses:      Posterior tibial pulses are 1+ on the right side and 1+ on the left side.  Pulmonary/Chest: Effort normal and breath sounds normal. No respiratory distress. He has no decreased breath sounds. He has no wheezes. He has no rales. He exhibits no tenderness.  Abdominal: Soft. He exhibits distension. There is no abdominal tenderness.  Musculoskeletal:        General: Edema  present.     Comments: 2+ bilateral lower extremity pitting edema to the knees  Neurological: He is alert and oriented to person, place, and time.  Skin: Skin is warm and dry. No cyanosis. Nails show no clubbing.  Psychiatric: He has a normal mood and affect. His speech is normal and behavior is normal. Judgment and thought content normal.     EKG:  Was ordered and interpreted by me today. Shows NSR, 74 bpm, low voltage QRS, nonspecific lateral ST-T changes  Recent Labs: 01/05/2019: B Natriuretic Peptide 2,927.0 01/07/2019: TSH 6.424 01/08/2019: ALT 73 01/12/2019: Hemoglobin 10.0; Platelets 148 01/13/2019: Magnesium 2.0 01/30/2019: BUN 30; Creatinine, Ser 1.24; Potassium 4.3; Sodium 143  No results found for requested labs within last 8760 hours.   Estimated Creatinine Clearance: 50.3 mL/min (by C-G formula based on SCr of 1.24 mg/dL).   Wt Readings from Last 3 Encounters:  02/05/19 171 lb 4 oz (77.7 kg)  01/23/19 172 lb (78 kg)  01/13/19 161 lb 14.4 oz (73.4 kg)     Other studies reviewed: Additional studies/records reviewed today include: summarized above  ASSESSMENT AND PLAN:  1. Chronic combined systolic and diastolic CHF/lower extremity swelling/elevated troponin: This was presumed to be nonischemic in the hospital.  Now that his GI symptoms have stabilized, we will pursue right and left cardiac cath to further evaluate his cardiomyopathy.  He continues to appear volume overloaded on exam today however with the return of his watery diarrhea over the past 2 days I am hesitant to place him on diuretic therapy as this caused a significant drop in his blood pressure in the hospital.  I have advised him and his family to take Toprol-XL 12.5 mg only once daily rather than twice daily.  Start losartan 12.5 mg daily.  Check CMP and CBC.  I suspect some of his lower extremity swelling and abdominal distention is secondary to third spacing in the setting of hypoalbuminemia and underlying anemia.   I recommend he follow-up with his PCP with regards to these issues.  We will continue to escalate evidence-based heart failure therapy as vital signs and symptoms allow.  2. History of bowel perforation: Patient denies being referred to surgery at time of discharge.  I have placed referral today.  He is hemodynamically stable.  3. Chronic diarrhea: Patient denies being referred to GI at time of discharge.  Diarrhea has returned.  I have placed referral to GI today.  We will check a CMP as outlined above with recommendation to replete potassium to goal of 4.0.  4. History of hypokalemia: Most recent potassium noted to be stable at 4.3.  Check CMP as above.  Recommend to replete potassium as indicated.  5. Transaminitis: Check CMP as above.  Cannot exclude congestive hepatopathy or underlying cirrhosis as parts of  the liver were obscured on recent abdominal ultrasound.  Patient has been referred to GI as above.  6. Anemia: Check CBC.  Recommend he follow-up with PCP.  7. AKI: Most recent serum creatinine improved to 1.17.  Check CMP as above.  8. Preprocedural cardiac evaluation: We received a fax from [facility] indicating the patient was scheduled for bilateral TKAs on 03/13/2019.  Upon discussing this with the patient and his family this was news to them as the patient has never been seen there and does not have an orthopedic physician.  I suspect this was sent to [place] in error by the orthopedic office.  Disposition: F/u with Dr. Fletcher Anon or an APP in 1 month.  Current medicines are reviewed at length with the patient today.  The patient did not have any concerns regarding medicines.  Signed, Christell Faith, PA-C 02/05/2019 10:42 AM     [place] [address] [place], [place] [address] [phone]

## 2019-02-03 NOTE — H&P (View-Only) (Signed)
Cardiology Office Note Date:  02/05/2019  Patient ID:  William, Holland Feb 03, 1946, MRN [id number] PCP:  Acquanetta Chain, DO  Cardiologist:  Dr. Fletcher Anon, MD    Chief Complaint: Hospital follow up  History of Present Illness: William Holland is a 73 y.o. male with history of recently diagnosed systolic CHF, diverticulosis, chronic diarrhea, anemia, and intellectual slowing who presents for hospital follow-up from recent admission to [facility] from 1/13 through 1/21 for bowl perforation with known history of significant diverticulosis complicated by volume overload with acute systolic CHF.  Prior to the above admission, the patient did not have any previously known cardiac history.  He presented to the hospital with an approximate 69-monthhistory of watery diarrhea with anywhere from 4-10 stools daily.  He was found to have prominent colon distention with CT of the abdomen and pelvis showing free air suggestive of bowel perforation.  He was noted to be significantly volume overloaded.  Despite the finding of free air in the abdominal cavity he was surprisingly hemodynamically stable with not a lot of symptoms.  Initial labs showed severe hypokalemia with a potassium less than 2 even after replacement, BNP close to 3000, CPK slightly above 7000, albumin 2.9, and a flat trending troponin of 0.16.  Echo showed an EF of 30 to 35%, diffuse hypokinesis, grade 2 diastolic dysfunction, mild mitral regurgitation, moderately dilated left atrium, moderately reduced RV systolic function, trivial pericardial effusion was identified, along with a right pleural effusion.  Diuresis was initially slowed secondary to his severe hypokalemia.  Due to the patient being hemodynamically stable and his high perioperative risk his surgery was canceled.  Repeat CT of the abdomen and pelvis showed a small amount of intraperitoneal air which was stable and no contrast extravasation.  Outpatient follow-up with surgery was recommended.   Diuresis continued to be hampered by hypotension.  It was felt that given his chronic diarrhea he was losing plenty of fluids and he was not discharged with diuretics.  Discharge labs: Potassium 4.4, serum creatinine 1.17, magnesium 2.0, WBC 6.1, Hgb 10.0, PLT 148, albumin 2.5, AST 83, ALT 73, T bili 1.6  He was not discharged on any evidence-based heart failure therapies secondary to relative hypotension.  Discharge weight documented at 73.4 kg.  Outpatient BMP from 01/29/2019 showed a serum creatinine of 1.35 with a potassium not being performed on the sample secondary to intact red blood cells.  Repeat BMP on 01/30/2019 showed a serum creatinine of 1.24 with a potassium of 4.3.  [facility] with a documented weight of 172 pounds (78 kg).  BP was soft at 102/57.  He was started on Toprol-XL 12.5 mg daily.  Patient comes in accompanied by his wife and brother today.  He is doing well from a cardiac perspective.  Unfortunately, he has noted the return of watery diarrhea over the past 2 days.  He is unaware of any outpatient follow-up with GI or general surgery.  He has not been weighing himself or checking blood pressures at home.  He has been taking Toprol-XL 12.5 mg twice daily rather than daily.  He continues to note lower extremity swelling that is stable when compared to his hospital discharge per the patient and family.  He does forget to elevate his legs when sitting frequently.  When he is home alone, he does not follow a low-sodium diet.  He is not wrapping his legs with Ace wraps or using compression stockings.  He has a stable two-pillow orthopnea.  He has not had any chest pain, abdominal distention, PND, or early satiety.  No falls since his hospital discharge.  He denies any BRBPR or melena.  Past Medical History:  Diagnosis Date  . Anemia   . Bowel perforation ([place])   . Chronic diarrhea   . Chronic systolic CHF (congestive heart failure)  ([place])    a. TTE 12/2018: EF of 30 to 35%, diffuse hypokinesis, grade 2 diastolic dysfunction, mild mitral regurgitation, moderately dilated left atrium, moderately reduced RV systolic function, trivial pericardial effusion was identified, along with a right pleural effusion  . Diverticulosis, sigmoid 02/27/2018   Colonoscopy done on 02/27/2018 by Dr. Marius Ditch  . Stuttering during school years     Past Surgical History:  Procedure Laterality Date  . APPENDECTOMY    . COLONOSCOPY WITH PROPOFOL N/A 02/27/2018   Procedure: COLONOSCOPY WITH PROPOFOL;  Surgeon: Lin Landsman, MD;  Location: [facility] ENDOSCOPY;  Service: Gastroenterology;  Laterality: N/A;  . EYE SURGERY      Current Meds  Medication Sig  . acidophilus (RISAQUAD) CAPS capsule Take 1 capsule by mouth daily.  Marland Kitchen levothyroxine (SYNTHROID, LEVOTHROID) 50 MCG tablet Take 1 tablet (50 mcg total) by mouth daily before breakfast.  . loperamide (IMODIUM) 2 MG capsule Take 1 capsule (2 mg total) by mouth every 6 (six) hours as needed for diarrhea or loose stools.  . metoprolol succinate (TOPROL XL) 25 MG 24 hr tablet Take 0.5 tablets (12.5 mg total) by mouth daily. (Patient taking differently: Take 12.5 mg by mouth 2 (two) times daily. )  . tamsulosin (FLOMAX) 0.4 MG CAPS capsule Take 1 capsule (0.4 mg total) by mouth daily.    Allergies:   Patient has no known allergies.   Social History:  The patient  reports that he has never smoked. He has never used smokeless tobacco. He reports that he does not drink alcohol or use drugs.   Family History:  The patient's family history includes Cancer in his brother and father; Uterine cancer in his mother.  ROS:   Review of Systems  Constitutional: Positive for malaise/fatigue. Negative for chills, diaphoresis, fever and weight loss.  HENT: Negative for congestion.   Eyes: Negative for discharge and redness.  Respiratory: Negative for cough, hemoptysis, sputum production, shortness of breath and  wheezing.   Cardiovascular: Positive for leg swelling. Negative for chest pain, palpitations, orthopnea, claudication and PND.  Gastrointestinal: Positive for diarrhea. Negative for abdominal pain, blood in stool, constipation, heartburn, melena, nausea and vomiting.  Genitourinary: Negative for hematuria.  Musculoskeletal: Negative for falls and myalgias.  Skin: Negative for rash.  Neurological: Positive for weakness. Negative for dizziness, tingling, tremors, sensory change, speech change, focal weakness and loss of consciousness.  Endo/Heme/Allergies: Does not bruise/bleed easily.  Psychiatric/Behavioral: Negative for substance abuse. The patient is not nervous/anxious.   All other systems reviewed and are negative.    PHYSICAL EXAM:  VS:  BP 120/70 (BP Location: Left Arm, Patient Position: Sitting, Cuff Size: Normal)   Pulse 74   Ht 5' 7"  (1.702 m)   Wt 171 lb 4 oz (77.7 kg)   BMI 26.82 kg/m  BMI: Body mass index is 26.82 kg/m.  Physical Exam  Constitutional: He is oriented to person, place, and time. He appears well-developed and well-nourished.  HENT:  Head: Normocephalic and atraumatic.  Eyes: Right eye exhibits no discharge. Left eye exhibits no discharge.  Neck: Normal range of motion. JVD present.  JVD elevated  approximately 8 cm  Cardiovascular: Normal rate, regular rhythm, S1 normal and S2 normal. Exam reveals no distant heart sounds, no friction rub, no midsystolic click and no opening snap.  Murmur heard. High-pitched blowing holosystolic murmur is present with a grade of 1/6 at the apex. Pulses:      Posterior tibial pulses are 1+ on the right side and 1+ on the left side.  Pulmonary/Chest: Effort normal and breath sounds normal. No respiratory distress. He has no decreased breath sounds. He has no wheezes. He has no rales. He exhibits no tenderness.  Abdominal: Soft. He exhibits distension. There is no abdominal tenderness.  Musculoskeletal:        General: Edema  present.     Comments: 2+ bilateral lower extremity pitting edema to the knees  Neurological: He is alert and oriented to person, place, and time.  Skin: Skin is warm and dry. No cyanosis. Nails show no clubbing.  Psychiatric: He has a normal mood and affect. His speech is normal and behavior is normal. Judgment and thought content normal.     EKG:  Was ordered and interpreted by me today. Shows NSR, 74 bpm, low voltage QRS, nonspecific lateral ST-T changes  Recent Labs: 01/05/2019: B Natriuretic Peptide 2,927.0 01/07/2019: TSH 6.424 01/08/2019: ALT 73 01/12/2019: Hemoglobin 10.0; Platelets 148 01/13/2019: Magnesium 2.0 01/30/2019: BUN 30; Creatinine, Ser 1.24; Potassium 4.3; Sodium 143  No results found for requested labs within last 8760 hours.   Estimated Creatinine Clearance: 50.3 mL/min (by C-G formula based on SCr of 1.24 mg/dL).   Wt Readings from Last 3 Encounters:  02/05/19 171 lb 4 oz (77.7 kg)  01/23/19 172 lb (78 kg)  01/13/19 161 lb 14.4 oz (73.4 kg)     Other studies reviewed: Additional studies/records reviewed today include: summarized above  ASSESSMENT AND PLAN:  1. Chronic combined systolic and diastolic CHF/lower extremity swelling/elevated troponin: This was presumed to be nonischemic in the hospital.  Now that his GI symptoms have stabilized, we will pursue right and left cardiac cath to further evaluate his cardiomyopathy.  He continues to appear volume overloaded on exam today however with the return of his watery diarrhea over the past 2 days I am hesitant to place him on diuretic therapy as this caused a significant drop in his blood pressure in the hospital.  I have advised him and his family to take Toprol-XL 12.5 mg only once daily rather than twice daily.  Start losartan 12.5 mg daily.  Check CMP and CBC.  I suspect some of his lower extremity swelling and abdominal distention is secondary to third spacing in the setting of hypoalbuminemia and underlying anemia.   I recommend he follow-up with his PCP with regards to these issues.  We will continue to escalate evidence-based heart failure therapy as vital signs and symptoms allow.  2. History of bowel perforation: Patient denies being referred to surgery at time of discharge.  I have placed referral today.  He is hemodynamically stable.  3. Chronic diarrhea: Patient denies being referred to GI at time of discharge.  Diarrhea has returned.  I have placed referral to GI today.  We will check a CMP as outlined above with recommendation to replete potassium to goal of 4.0.  4. History of hypokalemia: Most recent potassium noted to be stable at 4.3.  Check CMP as above.  Recommend to replete potassium as indicated.  5. Transaminitis: Check CMP as above.  Cannot exclude congestive hepatopathy or underlying cirrhosis as parts of  the liver were obscured on recent abdominal ultrasound.  Patient has been referred to GI as above.  6. Anemia: Check CBC.  Recommend he follow-up with PCP.  7. AKI: Most recent serum creatinine improved to 1.17.  Check CMP as above.  8. Preprocedural cardiac evaluation: We received a fax from [facility] indicating the patient was scheduled for bilateral TKAs on 03/13/2019.  Upon discussing this with the patient and his family this was news to them as the patient has never been seen there and does not have an orthopedic physician.  I suspect this was sent to [place] in error by the orthopedic office.  Disposition: F/u with Dr. Fletcher Anon or an APP in 1 month.  Current medicines are reviewed at length with the patient today.  The patient did not have any concerns regarding medicines.  Signed, Christell Faith, PA-C 02/05/2019 10:42 AM     [facility] [address] [place], [place] [address] [phone]

## 2019-02-05 ENCOUNTER — Encounter: Payer: Self-pay | Admitting: Physician Assistant

## 2019-02-05 ENCOUNTER — Ambulatory Visit (INDEPENDENT_AMBULATORY_CARE_PROVIDER_SITE_OTHER): Payer: Medicare Other | Admitting: Physician Assistant

## 2019-02-05 VITALS — BP 120/70 | HR 74 | Ht 67.0 in | Wt 171.2 lb

## 2019-02-05 DIAGNOSIS — K529 Noninfective gastroenteritis and colitis, unspecified: Secondary | ICD-10-CM | POA: Diagnosis not present

## 2019-02-05 DIAGNOSIS — I890 Lymphedema, not elsewhere classified: Secondary | ICD-10-CM

## 2019-02-05 DIAGNOSIS — I5022 Chronic systolic (congestive) heart failure: Secondary | ICD-10-CM

## 2019-02-05 DIAGNOSIS — E876 Hypokalemia: Secondary | ICD-10-CM

## 2019-02-05 DIAGNOSIS — I5042 Chronic combined systolic (congestive) and diastolic (congestive) heart failure: Secondary | ICD-10-CM

## 2019-02-05 DIAGNOSIS — R740 Nonspecific elevation of levels of transaminase and lactic acid dehydrogenase [LDH]: Secondary | ICD-10-CM

## 2019-02-05 DIAGNOSIS — K631 Perforation of intestine (nontraumatic): Secondary | ICD-10-CM | POA: Diagnosis not present

## 2019-02-05 DIAGNOSIS — R7401 Elevation of levels of liver transaminase levels: Secondary | ICD-10-CM

## 2019-02-05 DIAGNOSIS — N179 Acute kidney failure, unspecified: Secondary | ICD-10-CM

## 2019-02-05 DIAGNOSIS — D649 Anemia, unspecified: Secondary | ICD-10-CM

## 2019-02-05 MED ORDER — LOSARTAN POTASSIUM 25 MG PO TABS: 12.5000 mg | ORAL_TABLET | Freq: Every day | ORAL | 3 refills | Status: DC

## 2019-02-05 NOTE — Patient Instructions (Signed)
Medication Instructions:  Your physician has recommended you make the following change in your medication:  1- RESUME Taking Toprol as previously ordered. Take 0.5 tablets (12.5 mg total) by mouth once daily. 2- START Losartan Take 0.5 tablets (12.5 mg total) by mouth once daily If you need a refill on your cardiac medications before your next appointment, please call your pharmacy.   Lab work: Your physician recommends that you return for lab work today (CMet, CBC)  If you have labs (blood work) drawn today and your tests are completely normal, you will receive your results only by: Marland Kitchen MyChart Message (if you have MyChart) OR . A paper copy in the mail If you have any lab test that is abnormal or we need to change your treatment, we will call you to review the results.  Testing/Procedures: 1- Right/Left Heart Cath    [place], [place] [address] Dept: [phone] Loc: [place]  02/05/2019  You are scheduled for a Cardiac Catheterization on Monday, February 17 with Dr. [name].  1. Please arrive at the [place] [facility] at 9:30 AM (This time is one hour before your procedure to ensure your preparation). Free valet parking service is available.   Special note: Every effort is made to have your procedure done on time. Please understand that emergencies sometimes delay scheduled procedures.  2. Diet: Do not eat solid foods after midnight.  The patient may have clear liquids until 5am upon the day of the procedure.  3. Labs: Today 4. Medication instructions in preparation for your procedure:  On the morning of your procedure, take 1 tablet (81 mg) Aspirin and any morning medicines NOT listed above.  You may use sips of water.  5. Plan for one night stay--bring personal belongings. 6. Bring a current list of your medications and current insurance  cards. 7. You MUST have a responsible person to drive you home. 8. Someone MUST be with you the first 24 hours after you arrive home or your discharge will be delayed. 9. Please wear clothes that are easy to get on and off and wear slip-on shoes.  Thank you for allowing [place] to care for you!   -- [facility]   Follow-Up: At [facility], you and your health needs are our priority.  As part of our continuing mission to provide you with exceptional heart care, we have created designated Provider Care Teams.  These Care Teams include your primary Cardiologist (physician) and Advanced Practice Providers (APPs -  Physician Assistants and Nurse Practitioners) who all work together to provide you with the care you need, when you need it. You will need a follow up appointment in 1 months.  You may see Dr. Fletcher Anon or Christell Faith, PA-C  Any Other Special Instructions Will Be Listed Below (If Applicable). Referral to General Surgery  Referral to Gastrointestinal medicine

## 2019-02-06 ENCOUNTER — Telehealth: Payer: Self-pay | Admitting: *Deleted

## 2019-02-06 DIAGNOSIS — Z79899 Other long term (current) drug therapy: Secondary | ICD-10-CM

## 2019-02-06 DIAGNOSIS — I5042 Chronic combined systolic (congestive) and diastolic (congestive) heart failure: Secondary | ICD-10-CM

## 2019-02-06 LAB — COMPREHENSIVE METABOLIC PANEL
ALT: 14 IU/L (ref 0–44)
AST: 14 IU/L (ref 0–40)
Albumin/Globulin Ratio: 1.3 (ref 1.2–2.2)
Albumin: 3.7 g/dL (ref 3.7–4.7)
Alkaline Phosphatase: 112 IU/L (ref 39–117)
BUN/Creatinine Ratio: 21 (ref 10–24)
BUN: 25 mg/dL (ref 8–27)
Bilirubin Total: 0.8 mg/dL (ref 0.0–1.2)
CO2: 24 mmol/L (ref 20–29)
Calcium: 9 mg/dL (ref 8.6–10.2)
Chloride: 106 mmol/L (ref 96–106)
Creatinine, Ser: 1.2 mg/dL (ref 0.76–1.27)
GFR calc Af Amer: 69 mL/min/{1.73_m2} (ref >59)
GFR, EST NON AFRICAN AMERICAN: 60 mL/min/{1.73_m2} (ref >59)
Globulin, Total: 2.8 g/dL (ref 1.5–4.5)
Glucose: 86 mg/dL (ref 65–99)
POTASSIUM: 4.6 mmol/L (ref 3.5–5.2)
Sodium: 145 mmol/L — ABNORMAL HIGH (ref 134–144)
Total Protein: 6.5 g/dL (ref 6.0–8.5)

## 2019-02-06 LAB — CBC
Hematocrit: 32.5 % — ABNORMAL LOW (ref 37.5–51.0)
Hemoglobin: 10 g/dL — ABNORMAL LOW (ref 13.0–17.7)
MCH: 26.5 pg — ABNORMAL LOW (ref 26.6–33.0)
MCHC: 30.8 g/dL — ABNORMAL LOW (ref 31.5–35.7)
MCV: 86 fL (ref 79–97)
PLATELETS: 211 10*3/uL (ref 150–450)
RBC: 3.77 x10E6/uL — ABNORMAL LOW (ref 4.14–5.80)
RDW: 16.9 % — ABNORMAL HIGH (ref 11.6–15.4)
WBC: 6.1 10*3/uL (ref 3.4–10.8)

## 2019-02-06 NOTE — Telephone Encounter (Signed)
-----   Message from Rise Mu, PA-C sent at 02/06/2019  7:07 AM EST ----- Random glucose is normal. Renal function is stable.  Potassium is at goal. Liver function is improved and normal. HGB is low, though stable.   Recheck bmet in 1 week following initiation of losartan.

## 2019-02-06 NOTE — Telephone Encounter (Signed)
Spoke with patient's brother, ok per DPR. He verbalized understanding of results and he will [facility] lab work. Order entered.

## 2019-02-09 ENCOUNTER — Encounter
Admission: RE | Disposition: A | Discharge status: Home / Self Care | Payer: Self-pay | Source: Home / Self Care | Attending: Cardiovascular Disease

## 2019-02-09 ENCOUNTER — Other Ambulatory Visit: Payer: Self-pay

## 2019-02-09 ENCOUNTER — Ambulatory Visit
Admission: RE | Admit: 2019-02-09 | Discharge: 2019-02-09 | Disposition: A | Discharge status: Home / Self Care | Payer: Medicare Other | Attending: Cardiovascular Disease | Admitting: Cardiovascular Disease

## 2019-02-09 DIAGNOSIS — I5043 Acute on chronic combined systolic (congestive) and diastolic (congestive) heart failure: Secondary | ICD-10-CM | POA: Insufficient documentation

## 2019-02-09 DIAGNOSIS — I272 Pulmonary hypertension, unspecified: Secondary | ICD-10-CM | POA: Diagnosis not present

## 2019-02-09 DIAGNOSIS — N179 Acute kidney failure, unspecified: Secondary | ICD-10-CM | POA: Insufficient documentation

## 2019-02-09 DIAGNOSIS — Z79890 Hormone replacement therapy (postmenopausal): Secondary | ICD-10-CM | POA: Insufficient documentation

## 2019-02-09 DIAGNOSIS — R197 Diarrhea, unspecified: Secondary | ICD-10-CM | POA: Insufficient documentation

## 2019-02-09 DIAGNOSIS — D649 Anemia, unspecified: Secondary | ICD-10-CM | POA: Diagnosis not present

## 2019-02-09 DIAGNOSIS — E876 Hypokalemia: Secondary | ICD-10-CM | POA: Diagnosis not present

## 2019-02-09 DIAGNOSIS — I5042 Chronic combined systolic (congestive) and diastolic (congestive) heart failure: Secondary | ICD-10-CM | POA: Diagnosis not present

## 2019-02-09 DIAGNOSIS — Z79899 Other long term (current) drug therapy: Secondary | ICD-10-CM | POA: Insufficient documentation

## 2019-02-09 DIAGNOSIS — R740 Nonspecific elevation of levels of transaminase and lactic acid dehydrogenase [LDH]: Secondary | ICD-10-CM | POA: Diagnosis not present

## 2019-02-09 DIAGNOSIS — I5021 Acute systolic (congestive) heart failure: Secondary | ICD-10-CM

## 2019-02-09 HISTORY — PX: RIGHT/LEFT HEART CATH AND CORONARY ANGIOGRAPHY: CATH118266

## 2019-02-09 SURGERY — RIGHT/LEFT HEART CATH AND CORONARY ANGIOGRAPHY
Anesthesia: Moderate Sedation

## 2019-02-09 MED ORDER — HEPARIN SODIUM (PORCINE) 1000 UNIT/ML IJ SOLN
INTRAMUSCULAR | Status: DC | PRN
  Administered 2019-02-09: 4000 [IU] via INTRAVENOUS

## 2019-02-09 MED ORDER — IOPAMIDOL (ISOVUE-300) INJECTION 61%
INTRAVENOUS | Status: DC | PRN
  Administered 2019-02-09: 60 mL via INTRAVENOUS

## 2019-02-09 MED ORDER — SODIUM CHLORIDE 0.9 % IV SOLN: 250.0000 mL | INTRAVENOUS | Status: DC | PRN

## 2019-02-09 MED ORDER — SODIUM CHLORIDE 0.9% FLUSH: 3.0000 mL | INTRAVENOUS | Status: DC | PRN

## 2019-02-09 MED ORDER — ASPIRIN 81 MG PO CHEW: 81.0000 mg | CHEWABLE_TABLET | ORAL | Status: DC

## 2019-02-09 MED ORDER — SODIUM CHLORIDE 0.9% FLUSH: 3.0000 mL | Freq: Two times a day (BID) | INTRAVENOUS | Status: DC

## 2019-02-09 MED ORDER — FENTANYL CITRATE (PF) 100 MCG/2ML IJ SOLN
INTRAMUSCULAR | Status: DC | PRN
  Administered 2019-02-09: 25 ug via INTRAVENOUS

## 2019-02-09 MED ORDER — MIDAZOLAM HCL 2 MG/2ML IJ SOLN
INTRAMUSCULAR | Status: DC | PRN
  Administered 2019-02-09: 1 mg via INTRAVENOUS

## 2019-02-09 MED ORDER — SODIUM CHLORIDE 0.9 % IV SOLN
INTRAVENOUS | Status: DC
  Administered 2019-02-09: 10:00:00 via INTRAVENOUS

## 2019-02-09 MED ORDER — HEPARIN (PORCINE) IN NACL 1000-0.9 UT/500ML-% IV SOLN
INTRAVENOUS | Status: AC
  Filled 2019-02-09: qty 1000

## 2019-02-09 MED ORDER — ACETAMINOPHEN 325 MG PO TABS: 650.0000 mg | ORAL_TABLET | ORAL | Status: DC | PRN

## 2019-02-09 MED ORDER — VERAPAMIL HCL 2.5 MG/ML IV SOLN
INTRAVENOUS | Status: AC
  Filled 2019-02-09: qty 2

## 2019-02-09 MED ORDER — HEPARIN SODIUM (PORCINE) 1000 UNIT/ML IJ SOLN
INTRAMUSCULAR | Status: AC
  Filled 2019-02-09: qty 1

## 2019-02-09 MED ORDER — ASPIRIN 81 MG PO CHEW
CHEWABLE_TABLET | ORAL | Status: AC
  Filled 2019-02-09: qty 1

## 2019-02-09 MED ORDER — MIDAZOLAM HCL 2 MG/2ML IJ SOLN
INTRAMUSCULAR | Status: AC
  Filled 2019-02-09: qty 2

## 2019-02-09 MED ORDER — FENTANYL CITRATE (PF) 100 MCG/2ML IJ SOLN
INTRAMUSCULAR | Status: AC
  Filled 2019-02-09: qty 2

## 2019-02-09 MED ORDER — FUROSEMIDE 20 MG PO TABS: 20.0000 mg | ORAL_TABLET | Freq: Every day | ORAL | 3 refills | Status: DC

## 2019-02-09 MED ORDER — POTASSIUM CHLORIDE CRYS ER 20 MEQ PO TBCR: 20.0000 meq | EXTENDED_RELEASE_TABLET | Freq: Every day | ORAL | 3 refills | Status: DC

## 2019-02-09 MED ORDER — HEPARIN (PORCINE) IN NACL 1000-0.9 UT/500ML-% IV SOLN
INTRAVENOUS | Status: DC | PRN
  Administered 2019-02-09: 1000 mL

## 2019-02-09 MED ORDER — ONDANSETRON HCL 4 MG/2ML IJ SOLN: 4.0000 mg | Freq: Four times a day (QID) | INTRAMUSCULAR | Status: DC | PRN

## 2019-02-09 SURGICAL SUPPLY — 10 items
CATH BALLN WEDGE 5F 110CM (CATHETERS) ×3 IMPLANT
CATH INFINITI 5 FR JL3.5 (CATHETERS) ×3 IMPLANT
CATH INFINITI JR4 5F (CATHETERS) ×3 IMPLANT
DEVICE RAD TR BAND REGULAR (VASCULAR PRODUCTS) ×3 IMPLANT
GLIDESHEATH SLEND SS 6F .021 (SHEATH) ×3 IMPLANT
KIT MANI 3VAL PERCEP (MISCELLANEOUS) ×3 IMPLANT
KIT RIGHT HEART (MISCELLANEOUS) ×3 IMPLANT
PACK CARDIAC CATH (CUSTOM PROCEDURE TRAY) ×3 IMPLANT
SHEATH GLIDE SLENDER 4/5FR (SHEATH) ×3 IMPLANT
WIRE ROSEN-J .035X260CM (WIRE) ×3 IMPLANT

## 2019-02-09 NOTE — Interval H&P Note (Signed)
History and Physical Interval Note:  02/09/2019 12:09 PM  William Holland  has presented today for surgery, with the diagnosis of RT LT Heart Cath   Acute systolic heart failure  The various methods of treatment have been discussed with the patient and family. After consideration of risks, benefits and other options for treatment, the patient has consented to  Procedure(s): RIGHT/LEFT HEART CATH AND CORONARY ANGIOGRAPHY (N/A) as a surgical intervention .  The patient's history has been reviewed, patient examined, no change in status, stable for surgery.  I have reviewed the patient's chart and labs.  Questions were answered to the patient's satisfaction.     [name]

## 2019-02-09 NOTE — Progress Notes (Signed)
Informed Dr. Fletcher Anon of the patient's cloudy fowl smelling urine. He was also informed that per the sister-in law the patient has been having kidney stones and bowel issues recently. Per Dr. Fletcher Anon no changes to be made. Strongly encouraged the family at the bedside to call the PCP today regarding the patient's urine. They were educated regarding the importance and were agreeable to call.

## 2019-02-09 NOTE — Discharge Instructions (Signed)
Moderate Conscious Sedation, Adult, Care After These instructions provide you with information about caring for yourself after your procedure. Your health care provider may also give you more specific instructions. Your treatment has been planned according to current medical practices, but problems sometimes occur. Call your health care provider if you have any problems or questions after your procedure. What can I expect after the procedure? After your procedure, it is common:  To feel sleepy for several hours.  To feel clumsy and have poor balance for several hours.  To have poor judgment for several hours.  To vomit if you eat too soon. Follow these instructions at home: For at least 24 hours after the procedure:   Do not: ? Participate in activities where you could fall or become injured. ? Drive. ? Use heavy machinery. ? Drink alcohol. ? Take sleeping pills or medicines that cause drowsiness. ? Make important decisions or sign legal documents. ? Take care of children on your own.  Rest. Eating and drinking  Follow the diet recommended by your health care provider.  If you vomit: ? Drink water, juice, or soup when you can drink without vomiting. ? Make sure you have little or no nausea before eating solid foods. General instructions  Have a responsible adult stay with you until you are awake and alert.  Take over-the-counter and prescription medicines only as told by your health care provider.  If you smoke, do not smoke without supervision.  Keep all follow-up visits as told by your health care provider. This is important. Contact a health care provider if:  You keep feeling nauseous or you keep vomiting.  You feel light-headed.  You develop a rash.  You have a fever. Get help right away if:  You have trouble breathing. This information is not intended to replace advice given to you by your health care provider. Make sure you discuss any questions you have  with your health care provider. Document Released: 09/30/2013 Document Revised: 05/14/2016 Document Reviewed: 03/31/2016 Elsevier Interactive Patient Education  2019 [employer] After This sheet gives you information about how to care for yourself after your procedure. Your doctor may also give you more specific instructions. If you have problems or questions, contact your doctor. Follow these instructions at home: Insertion site care  Follow instructions from your doctor about how to take care of your long, thin tube (catheter) insertion area. Make sure you: ? Wash your hands with soap and water before you change your bandage (dressing). If you cannot use soap and water, use hand sanitizer. ? Change your bandage as told by your doctor. ? Leave stitches (sutures), skin glue, or skin tape (adhesive) strips in place. They may need to stay in place for 2 weeks or longer. If tape strips get loose and curl up, you may trim the loose edges. Do not remove tape strips completely unless your doctor says it is okay.  Do not take baths, swim, or use a hot tub until your doctor says it is okay.  You may shower 24-48 hours after the procedure or as told by your doctor. ? Gently wash the area with plain soap and water. ? Pat the area dry with a clean towel. ? Do not rub the area. This may cause bleeding.  Do not apply powder or lotion to the area. Keep the area clean and dry.  Check your insertion area every day for signs of infection. Check for: ? More redness, swelling, or pain. ?  Fluid or blood. ? Warmth. ? Pus or a bad smell. Activity  Rest as told by your doctor, usually for 1-2 days.  Do not lift anything that is heavier than 10 lbs. (4.5 kg) or as told by your doctor.  Do not drive for 24 hours if you were given a medicine to help you relax (sedative).  Do not drive or use heavy machinery while taking prescription pain medicine. General instructions   Go back to  your normal activities as told by your doctor, usually in about a week. Ask your doctor what activities are safe for you.  If the insertion area starts to bleed, lie flat and put pressure on the area. If the bleeding does not stop, get help right away. This is an emergency.  Drink enough fluid to keep your pee (urine) clear or pale yellow.  Take over-the-counter and prescription medicines only as told by your doctor.  Keep all follow-up visits as told by your doctor. This is important. Contact a doctor if:  You have a fever.  You have chills.  You have more redness, swelling, or pain around your insertion area.  You have fluid or blood coming from your insertion area.  The insertion area feels warm to the touch.  You have pus or a bad smell coming from your insertion area.  You have more bruising around the insertion area.  Blood collects in the tissue around the insertion area (hematoma) that may be painful to the touch. Get help right away if:  You have a lot of pain in the insertion area.  The insertion area swells very fast.  The insertion area is bleeding, and the bleeding does not stop after holding steady pressure on the area.  The area near or just beyond the insertion area becomes pale, cool, tingly, or numb. These symptoms may be an emergency. Do not wait to see if the symptoms will go away. Get medical help right away. Call your local emergency services (911 in the [place] Do not drive yourself to the hospital. Summary  After the procedure, it is common to have bruising and tenderness at the long, thin tube insertion area.  After the procedure, it is important to rest and drink plenty of fluids.  Do not take baths, swim, or use a hot tub until your doctor says it is okay to do so. You may shower 24-48 hours after the procedure or as told by your doctor.  If the insertion area starts to bleed, lie flat and put pressure on the area. If the bleeding does not stop,  get help right away. This is an emergency. This information is not intended to replace advice given to you by your health care provider. Make sure you discuss any questions you have with your health care provider. Document Released: 03/08/2009 Document Revised: 12/04/2016 Document Reviewed: 12/04/2016 Elsevier Interactive Patient Education  2019 [employer].

## 2019-02-10 ENCOUNTER — Other Ambulatory Visit: Payer: Self-pay

## 2019-02-10 ENCOUNTER — Ambulatory Visit: Payer: Medicare Other | Admitting: Surgery

## 2019-02-10 ENCOUNTER — Encounter: Payer: Self-pay | Admitting: Surgery

## 2019-02-10 VITALS — BP 116/66 | HR 88 | Temp 97.7°F | Resp 14 | Ht 68.0 in | Wt 170.6 lb

## 2019-02-10 DIAGNOSIS — K56699 Other intestinal obstruction unspecified as to partial versus complete obstruction: Secondary | ICD-10-CM | POA: Diagnosis not present

## 2019-02-10 NOTE — Patient Instructions (Addendum)
You may use Colace and increase fiber. Be sure to stay hydrated.  We will send the referral to [facility] or [facility] to General Surgery or Colorectal Surgery.

## 2019-02-10 NOTE — Progress Notes (Signed)
Surgical Clinic Progress/Follow-up Note   HPI:  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 Review of patient's chart indicates that he presented with pneumoperitoneum attributed to perforated bowel, for which urgent surgery was suggested/offered despite patient's hemodynamic stability with minimal symptoms, but surgery was canceled do to prohibitively high cardiac risk associated with surgery and general anesthesia. Due to this high risk, patient and his family decided not to proceed with surgery, and surgical consultant (Dr. Celine Ahr) instructed no further surgical management and to call if any questions or concerns. Patient was then discharged home with this outpatient surgical follow-up appointment. Patient reports he continues to experience chronic unchanged non-bloody loose BM's with +flatus, though he denies abdominal pain, N/V, abdominal bloating/distention, fever/chills, CP, or SOB.  Review of Systems:  Constitutional: denies any other weight loss, fever, chills, or sweats  Eyes: denies any other vision changes, history of eye injury  ENT: denies sore throat, hearing problems  Respiratory: denies shortness of breath, wheezing  Cardiovascular: denies chest pain, palpitations  Gastrointestinal: abdominal pain, N/V, and bowel function as per interval history Musculoskeletal: denies any other joint pains or cramps  Skin: Denies any other rashes or skin discolorations  Neurological: denies any other headache, dizziness, weakness  Psychiatric: denies any other depression, anxiety  All other review of systems: otherwise negative   Vital Signs:  BP 116/66   Pulse 88   Temp 97.7 F (36.5 C) (Oral)   Resp 14   Ht 5' 8"  (1.727 m)   Wt 170 lb 9.6 oz (77.4 kg)   SpO2 94%   BMI 25.94 kg/m    Physical Exam:  Constitutional:  -- Normal body  habitus  -- Awake, alert, and oriented x3  Eyes:  -- Pupils equally round and reactive to light  -- No scleral icterus  Ear, nose, throat:  -- No jugular venous distension  -- No nasal drainage, bleeding Pulmonary:  -- No crackles -- Equal breath sounds bilaterally -- Breathing non-labored at rest Cardiovascular:  -- S1, S2 present  -- No pericardial rubs  Gastrointestinal:  -- Soft, nontender, non-distended, no guarding/rebound  -- No abdominal masses appreciated, pulsatile or otherwise  Musculoskeletal / Integumentary:  -- Wounds or skin discoloration: None appreciated  -- Extremities: B/L UE and LE FROM, hands and feet warm  Neurologic:  -- Motor function: intact and symmetric  -- Sensation: intact and symmetric   Laboratory studies:  CBC Latest Ref Rng & Units 02/05/2019 01/12/2019 01/10/2019  WBC 3.4 - 10.8 x10E3/uL 6.1 6.1 7.4  Hemoglobin 13.0 - 17.7 g/dL 10.0(L) 10.0(L) 10.3(L)  Hematocrit 37.5 - 51.0 % 32.5(L) 32.3(L) 33.9(L)  Platelets 150 - 450 x10E3/uL 211 148(L) 157   CMP Latest Ref Rng & Units 02/05/2019 01/30/2019 01/13/2019  Glucose 65 - 99 mg/dL 86 113(H) 90  BUN 8 - 27 mg/dL 25 30(H) 18  Creatinine 0.76 - 1.27 mg/dL 1.20 1.24 1.17  Sodium 134 - 144 mmol/L 145(H) 143 138  Potassium 3.5 - 5.2 mmol/L 4.6 4.3 4.4  Chloride 96 - 106 mmol/L 106 109 103  CO2 20 - 29 mmol/L 24 27 31   Calcium 8.6 - 10.2 mg/dL 9.0 8.6(L) 7.9(L)  Total Protein 6.0 - 8.5 g/dL 6.5 - -  Total Bilirubin 0.0 - 1.2 mg/dL 0.8 - -  Alkaline Phos 39 - 117 IU/L 112 - -  AST 0 - 40 IU/L 14 - -  ALT 0 - 44 IU/L 14 - -   Imaging: No new imaging studies available for review since inpatient studies   Assessment:  73 y.o. yo Male with a problem list including...  Patient Active Problem List   Diagnosis Date Noted  . Chronic combined systolic and diastolic CHF, NYHA class 2 ([place]) 02/05/2019  . Chronic systolic (congestive) heart failure ([place]) 01/23/2019  . Hypotension 01/23/2019  .  Diverticulosis 01/23/2019  . Lymphedema 01/23/2019  . Hypokalemia   . Bowel perforation ([place])   . CHF exacerbation ([place]) 01/05/2019  . Pressure injury of skin 01/05/2019  . Chronic diarrhea of unknown origin   . Loss of weight   . Abdominal pain 03/10/2016  . 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  Plan:   - if patient was considered prohibitive medical/cardiac risk for urgent/emergent surgery, it is hard to imagine him being appropriate risk for elective surgery without significant optimization (if possible)  - discussed with patient continuing current management with understood and clearly discussed risks vs referral to tertiary center for surgical consultation in context of additional options potentially available to optimize peri-op supportive care   - though open to continuing current non-operative management, patient and his family express interest in consultation to discuss patient's options  - will request referral to [facility], or [facility]  - instructed to call office if any questions or concerns  All of the above recommendations were discussed with the patient and patient's family, and all of patient's and family's questions were answered to their expressed satisfaction.  -- Marilynne Drivers Rosana Hoes, MD, [place]: [facility] - Partnering for exceptional care. Office: [phone]

## 2019-02-11 ENCOUNTER — Encounter: Payer: Self-pay | Admitting: *Deleted

## 2019-02-11 NOTE — Progress Notes (Signed)
Referral has been sent to [facility]. They will contact the patient to schedule. Contact information has been given to the patient.

## 2019-02-18 ENCOUNTER — Telehealth: Payer: Self-pay | Admitting: *Deleted

## 2019-02-18 NOTE — Telephone Encounter (Signed)
Call placed to the patient's brother, per the dpr, to remind him that the patient needs repeat blood work. He stated that he would take him tomorrow.

## 2019-02-19 ENCOUNTER — Other Ambulatory Visit
Admission: RE | Admit: 2019-02-19 | Discharge: 2019-02-19 | Disposition: A | Discharge status: Home / Self Care | Payer: Medicare Other | Attending: Physician Assistant | Admitting: Physician Assistant

## 2019-02-19 DIAGNOSIS — I5042 Chronic combined systolic (congestive) and diastolic (congestive) heart failure: Secondary | ICD-10-CM | POA: Diagnosis present

## 2019-02-19 DIAGNOSIS — Z79899 Other long term (current) drug therapy: Secondary | ICD-10-CM | POA: Insufficient documentation

## 2019-02-19 LAB — BASIC METABOLIC PANEL
ANION GAP: 8 (ref 5–15)
BUN: 21 mg/dL (ref 8–23)
CO2: 30 mmol/L (ref 22–32)
Calcium: 8.5 mg/dL — ABNORMAL LOW (ref 8.9–10.3)
Chloride: 103 mmol/L (ref 98–111)
Creatinine, Ser: 1.09 mg/dL (ref 0.61–1.24)
Glucose, Bld: 102 mg/dL — ABNORMAL HIGH (ref 70–99)
Potassium: 3.6 mmol/L (ref 3.5–5.1)
Sodium: 141 mmol/L (ref 135–145)

## 2019-02-23 ENCOUNTER — Ambulatory Visit: Payer: Medicare Other | Admitting: Urology

## 2019-02-23 ENCOUNTER — Encounter: Payer: Self-pay | Admitting: Urology

## 2019-02-23 VITALS — BP 110/64 | HR 79 | Ht 69.0 in | Wt 161.0 lb

## 2019-02-23 DIAGNOSIS — Z87898 Personal history of other specified conditions: Secondary | ICD-10-CM | POA: Diagnosis not present

## 2019-02-23 DIAGNOSIS — N210 Calculus in bladder: Secondary | ICD-10-CM | POA: Diagnosis not present

## 2019-02-23 LAB — BLADDER SCAN AMB NON-IMAGING

## 2019-02-23 NOTE — Progress Notes (Signed)
   02/23/2019 12:45 PM   William Holland [birth year] [id number]  Reason for visit: Follow up bladder stones  HPI: I saw William Holland in urology clinic today in hospital follow-up.  He is a 73 year old male with developmental delay and multiple co-morbidities(including congestive heart failure with ejection fraction 30% and cardiomyopathy) who was recently admitted with free air on CT but benign abdominal exam, thought to be secondary to diverticular disease.  He was evaluated by general surgery.  Ultimately, surgery was deferred secondary to his extreme high risk with his medical co-morbidities and lack of symptoms with hemodynamic stability.  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  He reports he has been doing well since his discharge from the hospital.  He denies any abdominal pain.  He reports he is voiding well and denies any feeling of incomplete emptying.  He denies gross hematuria or flank pain.  He does occasionally have flecks of stool in his urine.  He has some urinary frequency and urgency.  He denies a history of urinary tract infections.  PVR in clinic today is 14 cc.  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  This puts him at high risk for infections and even retention.  We discussed that standard management would be abdominal surgery with general surgery to resect any abnormal bowel, remove the bladder stones, and close the bladder with need for catheter placement for at least 1 to 2 weeks.  They are adamant that he does not want to undergo any aggressive surgery at this time secondary to his high risk with his other comorbidities.  I again re-iterated the risks of observation including infection and even death.  They understand these risks and would not like to pursue any aggressive  intervention.  I do not think there is a role for endoscopic management of his impressive bladder stone burden, as he is asymptomatic and this would still expose him to moderate operative risk for minimal benefit.  RTC 1 year with PVR, sooner if problems  A total of 15 minutes were spent face-to-face with the patient, greater than 50% was spent in patient education, counseling, and coordination of care regarding colovesical fistula and bladder stones.   [place], [facility] [address] [place], [place] [address] [phone]

## 2019-02-25 ENCOUNTER — Telehealth: Payer: Self-pay

## 2019-02-25 NOTE — Telephone Encounter (Signed)
Spoke with [facility] about referral sent for the patient. They said that when they contacted the patient to schedule an appointment that he did not want to be seen. I spoke with the patient and he said that he was doing well and did not want to be seen for surgical consultation. He does not want surgery for this problem and will just be seen as needed.  I have sent this information to Dr Rosana Hoes.

## 2019-03-04 NOTE — Progress Notes (Deleted)
Cardiology Office Note Date:  03/04/2019  Patient ID:  William Holland, William Holland [birth year], MRN [id number] PCP:  Acquanetta Chain, DO  Cardiologist:  Dr. Fletcher Anon, MD  ***refresh   Chief Complaint: Follow up  History of Present Illness: William Holland is a 73 y.o. male with history of normal coronary arteries by cardiac cath on 03/06/9701, chronic systolic CHF secondary to nonischemic cardiomyopathy, pulmonary hypertension, diverticulosis, chronic diarrhea, anemia, and intellectual slowing who presents for follow up of his nonischemic cardiomyopathy.   hospital follow-up from recent admission to [facility] from 1/13 through 1/21 for bowl perforation with known history of significant diverticulosis complicated by volume overload with acute systolic CHF.  Patient was recently admitted in 12/2018 with bowel perforation with known history of significant diverticulosis complicated by volume overload and acute systolic CHF.  Prior to the above admission, the patient did not have any previously known cardiac history.  He presented to the hospital in 12/2018 with an approximate 8-monthhistory of watery diarrhea with anywhere from 4-10 stools daily.  He was found to have prominent colon distention with CT of the abdomen and pelvis showing free air suggestive of bowel perforation.  He was noted to be significantly volume overloaded.  Despite the finding of free air in the abdominal cavity he was surprisingly hemodynamically stable with not a lot of symptoms.  Initial labs showed severe hypokalemia with a potassium less than 2 even after replacement, BNP close to 3000, CPK slightly above 7000, albumin 2.9, and a flat trending troponin of 0.16.  Echo showed an EF of 30 to 35%, diffuse hypokinesis, grade 2 diastolic dysfunction, mild mitral regurgitation, moderately dilated left atrium, moderately reduced RV systolic function, trivial pericardial effusion was identified, along with a right pleural effusion.  Diuresis was  initially slowed secondary to his severe hypokalemia.  Due to the patient being hemodynamically stable and his high perioperative risk his surgery was canceled.  Repeat CT of the abdomen and pelvis showed a small amount of intraperitoneal air which was stable and no contrast extravasation.  Outpatient follow-up with surgery was recommended.  Diuresis continued to be hampered by hypotension.  It was felt that given his chronic diarrhea he was losing plenty of fluids and he was not discharged with diuretics.  Relative hypotension precluded escalation of evidence-based heart failure therapy.In hospital follow-up on 2/13 he was doing well from a cardiac perspective.  He did note a return of watery diarrhea.  He had not yet followed up with GI or general surgery.  He was not weighing himself.  Due to his cardiomyopathy, he underwent right and left cardiac cath on 02/09/2019 which showed near normal coronary arteries with no obstructive disease.  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  Right heart catheterization showed moderately elevated filling pressures, moderate pulmonary pretension, and normal cardiac output.  He was mildly volume overloaded and was started on low-dose Lasix with KCl.   Labs: 01/2019 - potassium 3.6, serum creatinine 1.09, LFT normal, hemoglobin 10  ***   Past Medical History:  Diagnosis Date  . Anemia   . Bowel perforation ([place]   . Chronic diarrhea   . Chronic systolic CHF (congestive heart failure) ([place]    a. TTE 12/2018: EF of 30 to 35%, diffuse hypokinesis, grade 2 diastolic dysfunction, mild mitral regurgitation, moderately dilated left atrium, moderately reduced RV systolic function, trivial pericardial effusion was identified, along with a right pleural effusion  . Diverticulosis, sigmoid 02/27/2018   Colonoscopy done  on 02/27/2018 by Dr. Marius Ditch  . Stuttering during school years     Past Surgical History:  Procedure Laterality Date  . APPENDECTOMY     . COLONOSCOPY WITH PROPOFOL N/A 02/27/2018   Procedure: COLONOSCOPY WITH PROPOFOL;  Surgeon: Lin Landsman, MD;  Location: [facility] ENDOSCOPY;  Service: Gastroenterology;  Laterality: N/A;  . EYE SURGERY    . RIGHT/LEFT HEART CATH AND CORONARY ANGIOGRAPHY N/A 02/09/2019   Procedure: RIGHT/LEFT HEART CATH AND CORONARY ANGIOGRAPHY;  Surgeon: Wellington Hampshire, MD;  Location: [facility] CV LAB;  Service: Cardiovascular;  Laterality: N/A;    No outpatient medications have been marked as taking for the 03/05/19 encounter (Appointment) with Rise Mu, PA-C.    Allergies:   Patient has no known allergies.   Social History:  The patient  reports that he has never smoked. He has never used smokeless tobacco. He reports that he does not drink alcohol or use drugs.   Family History:  The patient's family history includes Cancer in his brother and father; Uterine cancer in his mother.  ROS:   ROS   PHYSICAL EXAM: *** VS:  There were no vitals taken for this visit. BMI: There is no height or weight on file to calculate BMI.  Physical Exam   EKG:  Was ordered and interpreted by me today. Shows ***  Recent Labs: 01/05/2019: B Natriuretic Peptide 2,927.0 01/07/2019: TSH 6.424 01/13/2019: Magnesium 2.0 02/05/2019: ALT 14; Hemoglobin 10.0; Platelets 211 02/19/2019: BUN 21; Creatinine, Ser 1.09; Potassium 3.6; Sodium 141  No results found for requested labs within last 8760 hours.   Estimated Creatinine Clearance: 61.3 mL/min (by C-G formula based on SCr of 1.09 mg/dL).   Wt Readings from Last 3 Encounters:  02/23/19 161 lb (73 kg)  02/10/19 170 lb 9.6 oz (77.4 kg)  02/09/19 169 lb (76.7 kg)     Other studies reviewed: Additional studies/records reviewed today include: summarized above  ASSESSMENT AND PLAN:  1. ***  Disposition: F/u with Dr. Fletcher Anon or an APP in ***  Current medicines are reviewed at length with the patient today.  The patient did not have any concerns regarding  medicines.  Signed, Christell Faith, PA-C 03/04/2019 9:51 AM     [facility] [address] [place] [address] [place], [place] [address] [phone]

## 2019-03-05 ENCOUNTER — Ambulatory Visit: Payer: Medicare Other | Admitting: Physician Assistant

## 2019-03-06 ENCOUNTER — Ambulatory Visit: Payer: Medicare Other | Admitting: Family

## 2019-03-26 ENCOUNTER — Telehealth: Payer: Self-pay

## 2019-03-26 NOTE — Telephone Encounter (Signed)
Virtual Visit Pre-Appointment Phone Call  Steps For Call:  1. Confirm consent - "In the setting of the current Covid19 crisis, you are scheduled for a phone visit with your provider on April 08, 2019 at 2:30PM.  Just as we do with many in-office visits, in order for you to participate in this visit, we must obtain consent.  If you'd like, I can send this to your mychart (if signed up) or email for you to review.  Otherwise, I can obtain your verbal consent now.  All virtual visits are billed to your insurance company just like a normal visit would be.  By agreeing to a virtual visit, we'd like you to understand that the technology does not allow for your provider to perform an examination, and thus may limit your provider's ability to fully assess your condition.  Finally, though the technology is pretty good, we cannot assure that it will always work on either your or our end, and in the setting of a video visit, we may have to convert it to a phone-only visit.  In either situation, we cannot ensure that we have a secure connection.  Are you willing to proceed?"  2. Give patient instructions for WebEx download to smartphone as below if video visit  3. Advise patient to be prepared with any vital sign or heart rhythm information, their current medicines, and a piece of paper and pen handy for any instructions they may receive the day of their visit  4. Inform patient they will receive a phone call 15 minutes prior to their appointment time (may be from unknown caller ID) so they should be prepared to answer  5. Confirm that appointment type is correct in Epic appointment notes (video vs telephone)    TELEPHONE CALL NOTE  William Holland has been deemed a candidate for a follow-up tele-health visit to limit community exposure during the Covid-19 pandemic. I spoke with the patient via phone to ensure availability of phone/video source, confirm preferred email & phone number, and discuss instructions  and expectations.  I reminded William Holland to be prepared with any vital sign and/or heart rhythm information that could potentially be obtained via home monitoring, at the time of his visit. I reminded William Holland to expect a phone call at the time of his visit if his visit.  Did the patient verbally acknowledge consent to treatment? YES PER [place] (LISTED ON DPR)  Horton Finer 03/26/2019 1:43 PM   CONSENT FOR TELE-HEALTH VISIT - PLEASE REVIEW  I hereby voluntarily request, consent and authorize [facility] and its employed or contracted physicians, physician assistants, nurse practitioners or other licensed health care professionals (the Practitioner), to provide me with telemedicine health care services (the Services") as deemed necessary by the treating Practitioner. I acknowledge and consent to receive the Services by the Practitioner via telemedicine. I understand that the telemedicine visit will involve communicating with the Practitioner through live audiovisual communication technology and the disclosure of certain medical information by electronic transmission. I acknowledge that I have been given the opportunity to request an in-person assessment or other available alternative prior to the telemedicine visit and am voluntarily participating in the telemedicine visit.  I understand that I have the right to withhold or withdraw my consent to the use of telemedicine in the course of my care at any time, without affecting my right to future care or treatment, and that the Practitioner or I may terminate the telemedicine visit at  any time. I understand that I have the right to inspect all information obtained and/or recorded in the course of the telemedicine visit and may receive copies of available information for a reasonable fee.  I understand that some of the potential risks of receiving the Services via telemedicine include:   Delay or interruption in medical  evaluation due to technological equipment failure or disruption;  Information transmitted may not be sufficient (e.g. poor resolution of images) to allow for appropriate medical decision making by the Practitioner; and/or   In rare instances, security protocols could fail, causing a breach of personal health information.  Furthermore, I acknowledge that it is my responsibility to provide information about my medical history, conditions and care that is complete and accurate to the best of my ability. I acknowledge that Practitioner's advice, recommendations, and/or decision may be based on factors not within their control, such as incomplete or inaccurate data provided by me or distortions of diagnostic images or specimens that may result from electronic transmissions. I understand that the practice of medicine is not an exact science and that Practitioner makes no warranties or guarantees regarding treatment outcomes. I acknowledge that I will receive a copy of this consent concurrently upon execution via email to the email address I last provided but may also request a printed copy by calling the office of [place].    I understand that my insurance will be billed for this visit.   I have read or had this consent read to me.  I understand the contents of this consent, which adequately explains the benefits and risks of the Services being provided via telemedicine.   I have been provided ample opportunity to ask questions regarding this consent and the Services and have had my questions answered to my satisfaction.  I give my informed consent for the services to be provided through the use of telemedicine in my medical care  By participating in this telemedicine visit I agree to the above.

## 2019-04-07 NOTE — Progress Notes (Signed)
Virtual Visit via Telephone Note   This visit type was conducted due to national recommendations for restrictions regarding the COVID-19 Pandemic (e.g. social distancing) in an effort to limit this patient's exposure and mitigate transmission in our community.  Due to his co-morbid illnesses, this patient is at least at moderate risk for complications without adequate follow up.  This format is felt to be most appropriate for this patient at this time.  The patient did not have access to video technology/had technical difficulties with video requiring transitioning to audio format only (telephone).  All issues noted in this document were discussed and addressed.  No physical exam could be performed with this format.  Please refer to the patient's chart for his  consent to telehealth for [facility]   Evaluation Performed:  Follow-up visit  Date:  04/08/2019   ID:  William Holland, DOB July 18, 1946, MRN [id number]  Patient Location: Home  Provider Location: Home  PCP:  William Chain, DO  Cardiologist:  [name], MD  Electrophysiologist:  None   Chief Complaint:  Telehealth follow up  History of Present Illness:    William Holland is a 73 y.o. male who presents via audio/video conferencing for a telehealth visit today.  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  Prior to the patient's admission in 12/2018, he did not have any previously known cardiac history.  He presented to the hospital in 12/2018, with an approximate 62-monthhistory of watery diarrhea with anywhere from 4-10 stools daily.  He was found to have prominent colon distention with CT of the abdomen and pelvis showing free air suggestive of bowel perforation.  He was noted to be significantly volume overloaded.  Despite the finding of free air in the abdominal cavity he was surprisingly hemodynamically stable with not a lot  of symptoms.  Initial labs showed severe hypokalemia with a potassium less than 2 even after replacement, BNP close to 3000, CPK slightly above 7000, albumin 2.9, and a flat trending troponin of 0.16.  Echo showed an EF of 30 to 35%, diffuse hypokinesis, grade 2 diastolic dysfunction, mild mitral regurgitation, moderately dilated left atrium, moderately reduced RV systolic function, trivial pericardial effusion was identified, along with a right pleural effusion.  Diuresis was initially slowed secondary to his severe hypokalemia.  Due to the patient being hemodynamically stable and his high perioperative risk his surgery was canceled.  Repeat CT of the abdomen and pelvis showed a small amount of intraperitoneal air which was stable and no contrast extravasation.  Outpatient follow-up with surgery was recommended.  Diuresis continued to be hampered by hypotension.  It was felt that given his chronic diarrhea he was losing plenty of fluids and he was not discharged with diuretics or evidence based heart failure therapy secondary to relative hypotension. In follow up with cardiology in 01/2019, he was doing well from a cardiac perspective, though noted a return of his watery diarrhea. His lower extremity swelling was stable and he was not following a low sodium diet.  He underwent diagnostic R/LHC on 02/09/2019 that showed near normal coronary arteries without obstructive disease. 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 RHC showed moderately elevated filling pressures, moderate pulmonary hypertension, and normal cardiac output. Given he remained volume up, Lasix with KCl was added to his Toprol and losartan.   Labs: 01/2019 - SCr 1.09, K+ 3.6 (following losartan), HGB 10.0, albumin 3.7  Spoke with patient's brother for the patient today secondary to the patient's intellectual slowing.  The patient has been very well from a cardiac perspective since he was last seen.  He has denied any chest pain,  SOB, palpitations, dizziness, presyncope, or syncope. His lower extremity swelling has fully resolved and he denies any abdominal fullness, orthopnea, PND, or early satiety. He is monitoring his salt and fluid intake. His weight is down 30 pounds today compared to when I saw him in 01/2019. He continues to take Lasix 20 mg daily with KCl. His longstanding diarrhea continues. No falls. His energy is picking back up. On nice days, he has been going outside to walk around in his yard. He is now doing some light household work without issues. The patient's brother is quite pleased with the patient's progress.   The patient does not have symptoms concerning for COVID-19 infection (fever, chills, cough, or new shortness of breath).    Past Medical History:  Diagnosis Date  . Anemia   . Bowel perforation ([place])   . Chronic diarrhea   . Chronic systolic CHF (congestive heart failure) ([place])    a. TTE 12/2018: EF of 30 to 35%, diffuse hypokinesis, grade 2 diastolic dysfunction, mild mitral regurgitation, moderately dilated left atrium, moderately reduced RV systolic function, trivial pericardial effusion was identified, along with a right pleural effusion  . Diverticulosis, sigmoid 02/27/2018   Colonoscopy done on 02/27/2018 by Dr. Marius Ditch  . Stuttering during school years    Past Surgical History:  Procedure Laterality Date  . APPENDECTOMY    . COLONOSCOPY WITH PROPOFOL N/A 02/27/2018   Procedure: COLONOSCOPY WITH PROPOFOL;  Surgeon: Lin Landsman, MD;  Location: [facility];  Service: Gastroenterology;  Laterality: N/A;  . EYE SURGERY    . RIGHT/LEFT HEART CATH AND CORONARY ANGIOGRAPHY N/A 02/09/2019   Procedure: RIGHT/LEFT HEART CATH AND CORONARY ANGIOGRAPHY;  Surgeon: Wellington Hampshire, MD;  Location: [facility];  Service: Cardiovascular;  Laterality: N/A;     Current Meds  Medication Sig  . acidophilus (RISAQUAD) CAPS capsule Take 1 capsule by mouth daily.  . furosemide (LASIX) 20 MG  tablet Take 1 tablet (20 mg total) by mouth daily.  Marland Kitchen levothyroxine (SYNTHROID, LEVOTHROID) 50 MCG tablet Take 1 tablet (50 mcg total) by mouth daily before breakfast.  . loperamide (IMODIUM) 2 MG capsule Take 1 capsule (2 mg total) by mouth every 6 (six) hours as needed for diarrhea or loose stools.  Marland Kitchen losartan (COZAAR) 25 MG tablet Take 0.5 tablets (12.5 mg total) by mouth daily.  . metoprolol succinate (TOPROL XL) 25 MG 24 hr tablet Take 0.5 tablets (12.5 mg total) by mouth daily. (Patient taking differently: Take 12.5 mg by mouth 2 (two) times daily. )  . potassium chloride SA (K-DUR,KLOR-CON) 20 MEQ tablet Take 1 tablet (20 mEq total) by mouth daily.  . tamsulosin (FLOMAX) 0.4 MG CAPS capsule Take 1 capsule (0.4 mg total) by mouth daily.     Allergies:   Patient has no known allergies.   Social History   Tobacco Use  . Smoking status: Never Smoker  . Smokeless tobacco: Never Used  Substance Use Topics  . Alcohol use: No    Alcohol/week: 0.0 standard drinks  . Drug use: No     Family Hx: The patient's family history includes Cancer in his brother and father; Uterine cancer in his mother.  ROS:   Please see the history of present illness.     All other  systems reviewed and are negative.   Prior CV studies:   The following studies were reviewed today:  2D Echo 12/2018: - Left ventricle: The cavity size was at the upper limits of   normal. Wall thickness was normal. Systolic function was   moderately to severely reduced. The estimated ejection fraction   was in the range of 30% to 35%. Diffuse hypokinesis. Regional   wall motion abnormalities cannot be excluded. Features are   consistent with a pseudonormal left ventricular filling pattern,   with concomitant abnormal relaxation and increased filling   pressure (grade 2 diastolic dysfunction). Doppler parameters are   consistent with high ventricular filling pressure. - Aortic valve: Trileaflet; mildly thickened leaflets.  - Mitral valve: Mildly thickened leaflets . There was mild   regurgitation. - Left atrium: The atrium was moderately dilated. - Right ventricle: The cavity size was normal. Systolic function   was moderately reduced. - Pericardium, extracardiac: A trivial pericardial effusion was   identified. There was a right pleural effusion. __________  [facility] 01/2019: 1.  Near normal coronary arteries with no obstructive disease.  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  2.  Right heart catheterization showed moderately elevated filling pressures, moderate pulmonary hypertension and normal cardiac output.  Recommendations: The patient has nonischemic cardiomyopathy for which I recommend continuing medical therapy. He continues to be volume overloaded and thus I will go ahead and start him on small dose furosemide with potassium. __________  Labs/Other Tests and Data Reviewed:    EKG:  No ECG reviewed.  Recent Labs: 01/05/2019: B Natriuretic Peptide 2,927.0 01/07/2019: TSH 6.424 01/13/2019: Magnesium 2.0 02/05/2019: ALT 14; Hemoglobin 10.0; Platelets 211 02/19/2019: BUN 21; Creatinine, Ser 1.09; Potassium 3.6; Sodium 141   Recent Lipid Panel No results found for: CHOL, TRIG, HDL, CHOLHDL, LDLCALC, LDLDIRECT  Wt Readings from Last 3 Encounters:  04/08/19 141 lb (64 kg)  02/23/19 161 lb (73 kg)  02/10/19 170 lb 9.6 oz (77.4 kg)     Objective:    Vital Signs:  Ht 5' 9"  (1.753 m)   Wt 141 lb (64 kg)   BMI 20.82 kg/m    Well nourished, well developed male in no acute distress.   ASSESSMENT & PLAN:    1. 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 His weight is down 30 pounds compared to when I last saw him. His lower extremity swelling and abdominal distension is reportedly resolved. In the above setting, I have elected to  change his Lasix and KCl from scheduled to prn in an effort to preserve renal function. He will continue Toprol and losartan. I did not start spironolactone at this time in an effort to avoid the need for initial and follow up BMP given the COVID-19 pandemic, especially given the patient's comorbid conditions. In follow up, once social distancing restrictions have lessened, we will need to consider the addition of spironolactone in an effort to optimize his heart failure therapy. Once his heart failure therapy has been optimized we will need to schedule a repeat limited echo to evaluate for improvement in EF. If his EF remains < 35% at that time, consideration for EP referral will need to be made for possible ICD therapy. Consideration for outpatient sleep study, though with his intelectual slowing it is uncertain if he would tolerate this or a CPAP. CHF education.   2.  Lower extremity swelling: Resolved. Change Lasix to prn as above.   3. Chronic diarrhea: I suspect at this point given it appears based on his brother's account of resolution of lower extremity swelling and abdominal distension, and his documented weight decrease of 30 pounds, volume management will likely be undertaken with his chronic diarrhea. Previously referred to GI.   4. History of bowel perforation: Conservatively managed given stable vitals and comorbid conditions. Per surgery.   5. Transaminitis: Resolved.   6. Anemia: Stable.   COVID-19 Education: The signs and symptoms of COVID-19 were discussed with the patient and how to seek care for testing (follow up with PCP or arrange E-visit).  The importance of social distancing was discussed today.  Time:   Today, I have spent 11 minutes with the patient with telehealth technology discussing the above problems.     Medication Adjustments/Labs and Tests Ordered: Current medicines are reviewed at length with the patient today.  Concerns regarding medicines are outlined above.    Tests Ordered: No orders of the defined types were placed in this encounter.   Medication Changes: No orders of the defined types were placed in this encounter.   Disposition:  Follow up in 6 month(s)  Signed, Christell Faith, PA-C  04/08/2019 2:27 PM    [facility]

## 2019-04-08 ENCOUNTER — Other Ambulatory Visit: Payer: Self-pay

## 2019-04-08 ENCOUNTER — Telehealth (INDEPENDENT_AMBULATORY_CARE_PROVIDER_SITE_OTHER): Payer: Medicare Other | Admitting: Physician Assistant

## 2019-04-08 ENCOUNTER — Encounter: Payer: Self-pay | Admitting: Physician Assistant

## 2019-04-08 VITALS — Ht 69.0 in | Wt 141.0 lb

## 2019-04-08 DIAGNOSIS — D649 Anemia, unspecified: Secondary | ICD-10-CM

## 2019-04-08 DIAGNOSIS — I428 Other cardiomyopathies: Secondary | ICD-10-CM

## 2019-04-08 DIAGNOSIS — I5042 Chronic combined systolic (congestive) and diastolic (congestive) heart failure: Secondary | ICD-10-CM

## 2019-04-08 DIAGNOSIS — R7401 Elevation of levels of liver transaminase levels: Secondary | ICD-10-CM

## 2019-04-08 DIAGNOSIS — I2721 Secondary pulmonary arterial hypertension: Secondary | ICD-10-CM

## 2019-04-08 DIAGNOSIS — K631 Perforation of intestine (nontraumatic): Secondary | ICD-10-CM

## 2019-04-08 DIAGNOSIS — M7989 Other specified soft tissue disorders: Secondary | ICD-10-CM

## 2019-04-08 DIAGNOSIS — R740 Nonspecific elevation of levels of transaminase and lactic acid dehydrogenase [LDH]: Secondary | ICD-10-CM

## 2019-04-08 DIAGNOSIS — K529 Noninfective gastroenteritis and colitis, unspecified: Secondary | ICD-10-CM

## 2019-04-08 MED ORDER — POTASSIUM CHLORIDE CRYS ER 20 MEQ PO TBCR: 20.0000 meq | EXTENDED_RELEASE_TABLET | Freq: Every day | ORAL | 3 refills | Status: DC | PRN

## 2019-04-08 MED ORDER — FUROSEMIDE 20 MG PO TABS: 20.0000 mg | ORAL_TABLET | Freq: Every day | ORAL | 3 refills | Status: DC | PRN

## 2019-04-08 NOTE — Patient Instructions (Signed)
It was a pleasure to speak with you on the phone today! Thank you for allowing [place] to continue taking care of your [facility] needs during this time.   Feel free to call as needed for questions and concerns related to your cardiac needs.   Medication Instructions:  Your physician has recommended you make the following change in your medication:  1- Take Lasix once daily only as needed for shortness of breath, swelling in legs or weight gain > 3 lb in 1 day or 5 lb in 1 week. 2- Take Potassium only on days that you take Lasix.  If you need a refill on your cardiac medications before your next appointment, please call your pharmacy.   Lab work: None ordered  If you have labs (blood work) drawn today and your tests are completely normal, you will receive your results only by: Marland Kitchen MyChart Message (if you have MyChart) OR . A paper copy in the mail If you have any lab test that is abnormal or we need to change your treatment, we will call you to review the results.  Testing/Procedures: None ordered   Follow-Up: At [facility], you and your health needs are our priority.  As part of our continuing mission to provide you with exceptional heart care, we have created designated Provider Care Teams.  These Care Teams include your primary Cardiologist (physician) and Advanced Practice Providers (APPs -  Physician Assistants and Nurse Practitioners) who all work together to provide you with the care you need, when you need it. You will need a follow up appointment in 6 months.  Please call our office 2 months in advance to schedule this appointment.  You may see [name], MD or Christell Faith, PA-C.

## 2019-09-04 ENCOUNTER — Telehealth: Payer: Self-pay | Admitting: Physician Assistant

## 2019-09-04 NOTE — Telephone Encounter (Signed)
Call to patient in regards to triage message. Pt gave verbal permission for me to speak to brother, [name].  Per [name], the RN was visiting this morning and had called. He is not sure what reasoning for call is. I requested the name and number of RN who was assisting but they did not have that information.   Reaching out to front office staff to see if we have the information for home health RN.    We do not have name and number of RN. After chart review, it appears he is treated by [facility]. I called and they confirmed that he is a patient. I left a message for nurse manager to call me back.

## 2019-09-04 NOTE — Telephone Encounter (Signed)
Brandy with [employer] calling Pt c/o swelling: STAT is pt has developed SOB within 24 hours  1) How much weight have you gained and in what time span? Yes, but not sure how much approx 40 lbs over a year   2) If swelling, where is the swelling located? Abdominal region and bilateral lower legs  3) Are you currently taking a fluid pill? no  4) Are you currently SOB?no  5) Do you have a log of your daily weights (if so, list)? no  6) Have you gained 3 pounds in a day or 5 pounds in a week? Not sure  7) Have you traveled recently? No  4+ glucose in urine, possible UTI

## 2019-09-07 NOTE — Telephone Encounter (Signed)
Colletta Maryland, nurse with [facility], states patient is no longer active with [facility].

## 2019-09-08 NOTE — Telephone Encounter (Signed)
Call to get update on how patient is feeling. Brother [name] answered but said he was in the bathroom with handicapped son and would return our call at a later time.

## 2019-09-14 NOTE — Telephone Encounter (Signed)
Returned call. Brother, [name] answered and reported that pt was not in the residence at this time.   I asked him if he was able to give any update on how the patient was doing and he refused saying that he was caring for 2 grandchildren and handicapped son at this time. I asked for name and number of home health and he refused being able to locate at this time.   I have been unable to reach patient multiple times. I looked again in the chart to see if I could find name/contact for home health and I have been unable to. I left a message with PCP to see if they have contact for current home health.   I will also send a letter to patient to request that he call the office for update on sx.   Encounter completed at this time and I will continue to follow up.

## 2019-09-16 NOTE — Telephone Encounter (Signed)
Repeat call to [facility] office, have not heard back anything after VM left earlier this week.   Requesting to see if she has info about who is following pt for home health. [facility] has not since march.   No answer. Left VM.

## 2019-10-06 ENCOUNTER — Telehealth: Payer: Self-pay | Admitting: Physician Assistant

## 2019-10-06 DIAGNOSIS — I5042 Chronic combined systolic (congestive) and diastolic (congestive) heart failure: Secondary | ICD-10-CM

## 2019-10-06 NOTE — Telephone Encounter (Signed)
l  Mom to return call to verify address, return mail.

## 2019-10-07 NOTE — Progress Notes (Signed)
Cardiology Office Note    Date:  10/12/2019   ID:  William Holland, DOB 04-27-1946, MRN [id number]  PCP:  William Chain, DO  Cardiologist:  William Sacramento, MD  Electrophysiologist:  None   Chief Complaint: Follow-up  History of Present Illness:   William Holland is a 73 y.o. 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  Prior to the patient's admission in 12/2018, he did not have any previously known cardiac history.  He was admitted in 12/2018 with an approximate 63-monthhistory of watery diarrhea with anywhere from 4-10 stools daily.  He was found to have prominent colon distention with CT of the abdomen and pelvis showing free air suggestive of bowel perforation.  He was noted to be significantly volume overloaded.  Despite the finding of free air in the abdominal cavity he was surprisingly hemodynamically stable with not a lot of symptoms. Initial labs showed severe hypokalemia with a potassium less than 2 even after replacement, BNP close to 3000, CPK slightly above 7000, albumin 2.9, and a flat trending troponin of 0.16. Echo showed an EF of 30 to 35%, diffuse hypokinesis, grade 2 diastolic dysfunction, mild mitral regurgitation, moderately dilated left atrium, moderately reduced RV systolic function, trivial pericardial effusion was identified, along with a right pleural effusion. Diuresis was initially slowed secondary to his severe hypokalemia. Due to the patient being hemodynamically stable and his high perioperative risk his surgery was canceled. Repeat CT of the abdomen and pelvis showed a small amount of intraperitoneal air which was stable and no contrast extravasation. Outpatient follow-up with surgery was recommended. Diuresis continued to be hampered by hypotension.  It was felt that given his chronic diarrhea he was losing plenty of fluids and he was not discharged with diuretics or evidence based heart failure therapy secondary to relative hypotension. In follow up with cardiology in 01/2019, he was doing well from a cardiac perspective, though noted a return of his watery diarrhea. His lower extremity swelling was stable and he was not following a low sodium diet.  He underwent diagnostic R/LHC on 02/09/2019 that showed near normal coronary arteries without obstructive disease. 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 RHC showed moderately elevated filling pressures, moderate pulmonary hypertension, and normal cardiac output. Given he remained volume up, Lasix with KCl was added to his Toprol and losartan.  He was seen virtually in 03/2019 and was doing very well from a cardiac perspective.  His weight was down 30 pounds compared to when he was seen in 01/2019 with a home reported weight of 141 pounds.  Given his dramatic weight loss, his Lasix was changed to as needed.  His evidence-based heart failure therapy was not further escalated in the setting of COVID-19 pandemic at that time.  Patient comes in accompanied by family member today.  Patient's weight is up 7 pounds compared to when he was last seen in the office in 01/2019.  They are uncertain what his weight has been at home as he has not been weighed in the past several months.  However, he reported a home weight of 141 pounds at his virtual visit in 03/2019.  He has been out of Lasix for at least the past 2 to 3 months.  He does report continued compliance with losartan and Toprol-XL.  He has continued to  have multiple loose watery stools on a daily basis.  He has not followed up with GI for these.  He reports increased lower extremity swelling in the setting of being out of Lasix.  He denies any increased shortness of breath, chest pain, palpitations, dizziness, presyncope, or syncope.  He has stable  two-pillow orthopnea.  He does add salt to food though drinks less than 2 L of fluids on a daily basis.   Labs: 01/2019 - potassium 3.6, BUN 21, serum creatinine 1.09, albumin 3.7, AST/ALT normal, Hgb 10.0, PLT 211 12/2018 - magnesium 2.0, TSH elevated at 6.424, free T4 low at 0.78, free T3 low at 1.3, A1c 5.2  Past Medical History:  Diagnosis Date   Anemia    Bowel perforation (HCC)    Chronic diarrhea    Chronic systolic CHF (congestive heart failure) ([place])    a. TTE 12/2018: EF of 30 to 35%, diffuse hypokinesis, grade 2 diastolic dysfunction, mild mitral regurgitation, moderately dilated left atrium, moderately reduced RV systolic function, trivial pericardial effusion was identified, along with a right pleural effusion   Diverticulosis, sigmoid 02/27/2018   Colonoscopy done on 02/27/2018 by Dr. Marius Ditch   Stuttering during school years     Past Surgical History:  Procedure Laterality Date   APPENDECTOMY     COLONOSCOPY WITH PROPOFOL N/A 02/27/2018   Procedure: COLONOSCOPY WITH PROPOFOL;  Surgeon: Lin Landsman, MD;  Location: [facility];  Service: Gastroenterology;  Laterality: N/A;   EYE SURGERY     RIGHT/LEFT HEART CATH AND CORONARY ANGIOGRAPHY N/A 02/09/2019   Procedure: RIGHT/LEFT HEART CATH AND CORONARY ANGIOGRAPHY;  Surgeon: Wellington Hampshire, MD;  Location: [facility];  Service: Cardiovascular;  Laterality: N/A;    Current Medications: Current Meds  Medication Sig   metoprolol succinate (TOPROL XL) 25 MG 24 hr tablet Take 0.5 tablets (12.5 mg total) by mouth daily. (Patient taking differently: Take 12.5 mg by mouth 2 (two) times daily. )    Allergies:   Patient has no known allergies.   Social History   Socioeconomic History   Marital status: Single    Spouse name: Not on file   Number of children: Not on file   Years of education: Not on file   Highest education level: Not on file  Occupational History   Occupation: retired  Child psychotherapist strain: Not on file   Food insecurity    Worry: Not on file    Inability: Not on Lexicographer needs    Medical: Not on file    Non-medical: Not on file  Tobacco Use   Smoking status: Never Smoker   Smokeless tobacco: Never Used  Substance and Sexual Activity   Alcohol use: No    Alcohol/week: 0.0 standard drinks   Drug use: No   Sexual activity: Not on file    Comment: Not Asked  Lifestyle   Physical activity    Days per week: Not on file    Minutes per session: Not on file   Stress: Not on file  Relationships   Social connections    Talks on phone: Not on file    Gets together: Not on file    Attends religious service: Not on file    Active member of club or organization: Not on file    Attends meetings of clubs or organizations: Not on file    Relationship status: Not on file  Other Topics Concern   Not on  file  Social History Narrative   Lives at home with his nephew.  Unsteady gait.     Family History:  The patient's family history includes Cancer in his brother and father; Uterine cancer in his mother.  ROS:   Review of Systems  Constitutional: Positive for malaise/fatigue. Negative for chills, diaphoresis, fever and weight loss.  HENT: Negative for congestion.   Eyes: Negative for discharge and redness.  Respiratory: Negative for cough, hemoptysis, sputum production, shortness of breath and wheezing.   Cardiovascular: Positive for leg swelling. Negative for chest pain, palpitations, orthopnea, claudication and PND.  Gastrointestinal: Positive for diarrhea. Negative for abdominal pain, blood in stool, constipation, heartburn, melena, nausea and vomiting.  Genitourinary: Negative for hematuria.  Musculoskeletal: Negative for falls and myalgias.  Skin: Negative for rash.  Neurological: Positive for weakness. Negative for dizziness, tingling, tremors, sensory change, speech change, focal weakness and loss of  consciousness.  Endo/Heme/Allergies: Does not bruise/bleed easily.  Psychiatric/Behavioral: Negative for substance abuse. The patient is not nervous/anxious.   All other systems reviewed and are negative.    EKGs/Labs/Other Studies Reviewed:    Studies reviewed were summarized above. The additional studies were reviewed today:  2D Echo 12/2018: - Left ventricle: The cavity size was at the upper limits of normal. Wall thickness was normal. Systolic function was moderately to severely reduced. The estimated ejection fraction was in the range of 30% to 35%. Diffuse hypokinesis. Regional wall motion abnormalities cannot be excluded. Features are consistent with a pseudonormal left ventricular filling pattern, with concomitant abnormal relaxation and increased filling pressure (grade 2 diastolic dysfunction). Doppler parameters are consistent with high ventricular filling pressure. - Aortic valve: Trileaflet; mildly thickened leaflets. - Mitral valve: Mildly thickened leaflets . There was mild regurgitation. - Left atrium: The atrium was moderately dilated. - Right ventricle: The cavity size was normal. Systolic function was moderately reduced. - Pericardium, extracardiac: A trivial pericardial effusion was identified. There was a right pleural effusion. __________  [facility] 01/2019: 1. Near normal coronary arteries with no obstructive disease. 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  2. Right heart catheterization showed moderately elevated filling pressures, moderate pulmonary hypertension and normal cardiac output.  Recommendations: The patient has nonischemic cardiomyopathy for which I recommend continuing medical therapy. He continues to be volume overloaded and thus I will go ahead and start him on small dose furosemide with potassium. __________   EKG:  EKG is ordered today.  The EKG ordered today  demonstrates NSR, 73 bpm, right axis deviation, inferior lateral T wave inversion (grossly unchanged from prior)  Recent Labs: 01/05/2019: B Natriuretic Peptide 2,927.0 01/07/2019: TSH 6.424 01/13/2019: Magnesium 2.0 02/05/2019: ALT 14; Hemoglobin 10.0; Platelets 211 02/19/2019: BUN 21; Creatinine, Ser 1.09; Potassium 3.6; Sodium 141  Recent Lipid Panel No results found for: CHOL, TRIG, HDL, CHOLHDL, VLDL, LDLCALC, LDLDIRECT  PHYSICAL EXAM:    VS:  BP (!) 110/50 (BP Location: Right Arm, Patient Position: Sitting, Cuff Size: Normal)    Pulse 73    Ht 5' 9"  (1.753 m)    Wt 178 lb 12 oz (81.1 kg)    BMI 26.40 kg/m   BMI: Body mass index is 26.4 kg/m.  Physical Exam  Constitutional: He is oriented to person, place, and time. He appears well-developed and well-nourished.  HENT:  Head: Normocephalic and atraumatic.  Eyes: Right eye exhibits no discharge. Left eye exhibits no discharge.  Neck: Normal range of motion. JVD present.  JVD elevated  approximately 8 cm  Cardiovascular: Normal rate, regular rhythm, S1 normal, S2 normal and normal heart sounds. Exam reveals no distant heart sounds, no friction rub, no midsystolic click and no opening snap.  No murmur heard. Pulmonary/Chest: Effort normal. No respiratory distress. He has decreased breath sounds. He has no wheezes. He has no rales. He exhibits no tenderness.  Abdominal: Soft. He exhibits no distension. There is no abdominal tenderness.  Musculoskeletal:        General: Edema present.     Comments: 1+ bilateral lower extremity pitting edema to the thighs  Neurological: He is alert and oriented to person, place, and time.  Skin: Skin is warm and dry. No cyanosis. Nails show no clubbing.  Psychiatric: He has a normal mood and affect. His speech is normal and behavior is normal. Judgment and thought content normal.    Wt Readings from Last 3 Encounters:  10/12/19 178 lb 12 oz (81.1 kg)  04/08/19 141 lb (64 kg)  02/23/19 161 lb (73 kg)      ASSESSMENT & PLAN:   1. Acute on chronic combined systolic and diastolic CHF/NICM/pulmonary hypertension: Patient is volume overloaded and has been out of his Lasix for at least the past 2 to 3 months.  He appears NYHA class II.  He has not been following a low-sodium/heart healthy diet.  ReDs vest reading of 42%.  Check stat CBC and BMP.  If he is again found to have significant hypokalemia he will be referred to the ED for further evaluation and management.  If renal function and potassium are stable the plan is for the patient to restart Lasix 40 mg daily for 5 days followed by 20 mg daily thereafter.  He will also be placed on KCl 20 mEq daily for 5 days followed by 10 mEq daily thereafter.  Following diuresis, we will look to potentially add spironolactone.  Continue losartan and Toprol-XL.  In the past, relative hypotension, and COVID-19 pandemic, have precluded transition to [facility] or addition of spironolactone.  CHF education discussed in detail.  Following optimization of evidence-based heart failure therapy, and continued compliance, recommend rechecking echo in several months time to evaluate LV systolic function.  If EF remains less than 30% at that time would recommend referral to EP for consideration of ICD.  2. Anemia: Check CBC.  3. Chronic diarrhea: Symptoms persist.  Refer to GI.  4. Hypothyroidism: No longer on levothyroxine.  Recommend he discuss this with his PCP.  Disposition: F/u with Dr. Fletcher Anon or an APP in 1 week.    Medication Adjustments/Labs and Tests Ordered: Current medicines are reviewed at length with the patient today.  Concerns regarding medicines are outlined above. Medication changes, Labs and Tests ordered today are summarized above and listed in the Patient Instructions accessible in Encounters.   Signed, Christell Faith, PA-C 10/12/2019 10:59 AM     [facility] [address] [place], [place] [address] [phone]

## 2019-10-12 ENCOUNTER — Other Ambulatory Visit: Payer: Self-pay

## 2019-10-12 ENCOUNTER — Other Ambulatory Visit
Admission: RE | Admit: 2019-10-12 | Discharge: 2019-10-12 | Disposition: A | Discharge status: Home / Self Care | Payer: Medicare Other | Source: Ambulatory Visit | Attending: Physician Assistant | Admitting: Physician Assistant

## 2019-10-12 ENCOUNTER — Encounter: Payer: Self-pay | Admitting: Physician Assistant

## 2019-10-12 ENCOUNTER — Ambulatory Visit (INDEPENDENT_AMBULATORY_CARE_PROVIDER_SITE_OTHER): Payer: Medicare Other | Admitting: Physician Assistant

## 2019-10-12 VITALS — BP 110/50 | HR 73 | Ht 69.0 in | Wt 178.8 lb

## 2019-10-12 DIAGNOSIS — D649 Anemia, unspecified: Secondary | ICD-10-CM

## 2019-10-12 DIAGNOSIS — I5043 Acute on chronic combined systolic (congestive) and diastolic (congestive) heart failure: Secondary | ICD-10-CM

## 2019-10-12 DIAGNOSIS — K529 Noninfective gastroenteritis and colitis, unspecified: Secondary | ICD-10-CM

## 2019-10-12 DIAGNOSIS — I428 Other cardiomyopathies: Secondary | ICD-10-CM

## 2019-10-12 DIAGNOSIS — I2721 Secondary pulmonary arterial hypertension: Secondary | ICD-10-CM

## 2019-10-12 LAB — BASIC METABOLIC PANEL
Anion gap: 10 (ref 5–15)
BUN: 25 mg/dL — ABNORMAL HIGH (ref 8–23)
CO2: 31 mmol/L (ref 22–32)
Calcium: 8.7 mg/dL — ABNORMAL LOW (ref 8.9–10.3)
Chloride: 104 mmol/L (ref 98–111)
Creatinine, Ser: 1.37 mg/dL — ABNORMAL HIGH (ref 0.61–1.24)
GFR calc Af Amer: 59 mL/min — ABNORMAL LOW (ref >60)
GFR calc non Af Amer: 51 mL/min — ABNORMAL LOW (ref >60)
Glucose, Bld: 148 mg/dL — ABNORMAL HIGH (ref 70–99)
Potassium: 3.8 mmol/L (ref 3.5–5.1)
Sodium: 145 mmol/L (ref 135–145)

## 2019-10-12 LAB — CBC
HCT: 35.7 % — ABNORMAL LOW (ref 39.0–52.0)
Hemoglobin: 10.8 g/dL — ABNORMAL LOW (ref 13.0–17.0)
MCH: 27.5 pg (ref 26.0–34.0)
MCHC: 30.3 g/dL (ref 30.0–36.0)
MCV: 90.8 fL (ref 80.0–100.0)
Platelets: 158 10*3/uL (ref 150–400)
RBC: 3.93 MIL/uL — ABNORMAL LOW (ref 4.22–5.81)
RDW: 16.9 % — ABNORMAL HIGH (ref 11.5–15.5)
WBC: 7.9 10*3/uL (ref 4.0–10.5)
nRBC: 0 % (ref 0.0–0.2)

## 2019-10-12 MED ORDER — FUROSEMIDE 20 MG PO TABS: 20.0000 mg | ORAL_TABLET | Freq: Every day | ORAL | 11 refills | Status: DC

## 2019-10-12 MED ORDER — POTASSIUM CHLORIDE CRYS ER 20 MEQ PO TBCR: 10.0000 meq | EXTENDED_RELEASE_TABLET | Freq: Every day | ORAL | 3 refills | Status: DC

## 2019-10-12 NOTE — Patient Instructions (Signed)
Medication Instructions:  1- INCREASE Lasix to 2 tablets (40 mg total) once daily for 5 days, then 1 tablet (20 mg total) once daily thereafter.  2- INCREASE K CL to 20 MEQ daily, then take 0.5 tablet (10 meq total) once daily thereafter.  *If you need a refill on your cardiac medications before your next appointment, please call your pharmacy*  Lab Work: Your physician recommends that you return for lab work today at the medical mall. No appt is needed. Hours are M-F 7AM- 6 PM.  If you have labs (blood work) drawn today and your tests are completely normal, you will receive your results only by: Marland Kitchen MyChart Message (if you have MyChart) OR . A paper copy in the mail If you have any lab test that is abnormal or we need to change your treatment, we will call you to review the results.  Testing/Procedures: None ordered  Follow-Up: At [facility], you and your health needs are our priority.  As part of our continuing mission to provide you with exceptional heart care, we have created designated Provider Care Teams.  These Care Teams include your primary Cardiologist (physician) and Advanced Practice Providers (APPs -  Physician Assistants and Nurse Practitioners) who all work together to provide you with the care you need, when you need it.  Your next appointment:   1 week  The format for your next appointment:   In Person  Provider:    You may see [name], MD or one of the following Advanced Practice Providers on your designated Care Team:    Murray Hodgkins, NP  Christell Faith, PA-C  Marrianne Mood, PA-C

## 2019-10-13 NOTE — Telephone Encounter (Signed)
Attempted call to patient. No answer, no VM.

## 2019-10-13 NOTE — Telephone Encounter (Signed)
-----   Message from Rise Mu, PA-C sent at 10/12/2019  2:00 PM EDT ----- Labs show slightly worse kidney function when compared to prior with stable potassium.  Blood count remains low though stable.  Given patient's elevated ReDs vest reading of 42% and in the setting of his significant weight gain with noted lower extremity swelling and crackles/diminished breath sounds on exam with an elevated JVD we will continue with outpatient diuresis.  Hopefully, with fluid removal his renal function will improve.  Follow-up next week as planned.  Will need follow-up labs at that time.

## 2019-10-14 NOTE — Progress Notes (Signed)
Cardiology Office Note    Date:  10/19/2019   ID:  William Holland, DOB July 31, 1946, MRN [id number]  PCP:  Acquanetta Chain, DO  Cardiologist:  [name], MD  Electrophysiologist:  None   Chief Complaint: Follow-up  History of Present Illness:   William Holland is a 73 y.o. 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  Prior to the patient's admission in 12/2018, he did not have any previously known cardiac history.  He was admitted in 12/2018 with an approximate 51-monthhistory of watery diarrhea with anywhere from 4-10 stools daily.  He was found to have prominent colon distention with CT of the abdomen and pelvis showing free air suggestive of bowel perforation.  He was noted to be significantly volume overloaded.  Despite the finding of free air in the abdominal cavity he was surprisingly hemodynamically stable with not a lot of symptoms. Initial labs showed severe hypokalemia with a potassium less than 2 even after replacement, BNP close to 3000, CPK slightly above 7000, albumin 2.9, and a flat trending troponin of 0.16. Echo showed an EF of 30 to 35%, diffuse hypokinesis, grade 2 diastolic dysfunction, mild mitral regurgitation, moderately dilated left atrium, moderately reduced RV systolic function, trivial pericardial effusion was identified, along with a right pleural effusion. Diuresis was initially slowed secondary to his severe hypokalemia. Due to the patient being hemodynamically stable and his high perioperative risk his surgery was canceled. Repeat CT of the abdomen and pelvis showed a small amount of intraperitoneal air which was stable and no contrast extravasation. Outpatient follow-up with surgery was recommended. Diuresis continued to be hampered by  hypotension. It was felt that given his chronic diarrhea he was losing plenty of fluids and he was not discharged with diureticsor evidence based heart failure therapy secondary to relative hypotension. In follow up with cardiology in 01/2019, he was doing well from a cardiac perspective, though noted a return of his watery diarrhea. His lower extremity swelling was stableand he was not following a low sodium diet. He underwent diagnostic R/LHC on 02/09/2019 that showed near normal coronary arteries without obstructive disease. 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 RHC showed moderately elevated filling pressures, moderate pulmonary hypertension, and normal cardiac output. Given he remained volume up, Lasix with KCl was added to his Toprol and losartan.  He was seen virtually in 03/2019 and was doing very well from a cardiac perspective.  His weight was down 30 pounds compared to when he was seen in 01/2019 with a home reported weight of 141 pounds.  Given his dramatic weight loss, his Lasix was changed to as needed.  His evidence-based heart failure therapy was not further escalated in the setting of COVID-19 pandemic at that time.  I last saw him in the office on 10/12/2019 with his weight being up weight being up 7 pounds compared to when he was last seen in the office in 01/2019, with a documented weight of 178 pounds.  Home weight was uncertain as he had not been weighed in the past several months.  He had been out of Lasix for at least 2 to 3 months.  He was noncompliant with a heart healthy diet.  He continued to have multiple loose watery stools on a daily basis and had not followed up with GI.  ReDs  vest reading in the office that day of 42%.  He was restarted on Lasix 40 mg daily for 5 days followed by 20 mg daily thereafter.  Stat labs were obtained that day and detailed below.  He comes in accompanied by his brother today.  His weight is down 7 pounds from 178-171 compared to  his visit last week following outpatient diuresis.  Compliant with all medications including Lasix 20 mg daily.  He denies eating a diet high in sodium.  Drinking less than 2 L of fluid per day.  He has stable two-pillow orthopnea.  Lower extremity swelling and abdominal distention persists.  No chest pain, shortness of breath, palpitations, dizziness, presyncope, syncope.  Watery diarrhea persists.   Labs: 09/2019 - potassium 3.8, BUN 25, SCr 1.37, HGB 10.8, PLT 158 01/2019 - potassium 3.6, BUN 21, serum creatinine 1.09, albumin 3.7, AST/ALT normal, Hgb 10.0, PLT 211 12/2018 - magnesium 2.0, TSH elevated at 6.424, free T4 low at 0.78, free T3 low at 1.3, A1c 5.2  Past Medical History:  Diagnosis Date   Anemia    Bowel perforation (HCC)    Chronic diarrhea    Chronic systolic CHF (congestive heart failure) ([place])    a. TTE 12/2018: EF of 30 to 35%, diffuse hypokinesis, grade 2 diastolic dysfunction, mild mitral regurgitation, moderately dilated left atrium, moderately reduced RV systolic function, trivial pericardial effusion was identified, along with a right pleural effusion   Diverticulosis, sigmoid 02/27/2018   Colonoscopy done on 02/27/2018 by Dr. Marius Ditch   Stuttering during school years     Past Surgical History:  Procedure Laterality Date   APPENDECTOMY     COLONOSCOPY WITH PROPOFOL N/A 02/27/2018   Procedure: COLONOSCOPY WITH PROPOFOL;  Surgeon: Lin Landsman, MD;  Location: [facility];  Service: Gastroenterology;  Laterality: N/A;   EYE SURGERY     RIGHT/LEFT HEART CATH AND CORONARY ANGIOGRAPHY N/A 02/09/2019   Procedure: RIGHT/LEFT HEART CATH AND CORONARY ANGIOGRAPHY;  Surgeon: Wellington Hampshire, MD;  Location: [facility];  Service: Cardiovascular;  Laterality: N/A;    Current Medications: Current Meds  Medication Sig   acidophilus (RISAQUAD) CAPS capsule Take 1 capsule by mouth daily.   furosemide (LASIX) 20 MG tablet Take 1 tablet (20 mg total) by  mouth daily.   levothyroxine (SYNTHROID, LEVOTHROID) 50 MCG tablet Take 1 tablet (50 mcg total) by mouth daily before breakfast.   loperamide (IMODIUM) 2 MG capsule Take 1 capsule (2 mg total) by mouth every 6 (six) hours as needed for diarrhea or loose stools.   losartan (COZAAR) 25 MG tablet Take 0.5 tablets (12.5 mg total) by mouth daily.   metoprolol succinate (TOPROL XL) 25 MG 24 hr tablet Take 0.5 tablets (12.5 mg total) by mouth daily. (Patient taking differently: Take 12.5 mg by mouth 2 (two) times daily. )   potassium chloride SA (KLOR-CON) 20 MEQ tablet Take 0.5 tablets (10 mEq total) by mouth daily. (Patient taking differently: Take 10 mEq by mouth 2 (two) times daily. )   tamsulosin (FLOMAX) 0.4 MG CAPS capsule Take 1 capsule (0.4 mg total) by mouth daily.    Allergies:   Patient has no known allergies.   Social History   Socioeconomic History   Marital status: Single    Spouse name: Not on file   Number of children: Not on file   Years of education: Not on file   Highest education level: Not on file  Occupational History   Occupation: retired  Science writer  Needs   Financial resource strain: Not on file   Food insecurity    Worry: Not on file    Inability: Not on file   Transportation needs    Medical: Not on file    Non-medical: Not on file  Tobacco Use   Smoking status: Never Smoker   Smokeless tobacco: Never Used  Substance and Sexual Activity   Alcohol use: No    Alcohol/week: 0.0 standard drinks   Drug use: No   Sexual activity: Not on file    Comment: Not Asked  Lifestyle   Physical activity    Days per week: Not on file    Minutes per session: Not on file   Stress: Not on file  Relationships   Social connections    Talks on phone: Not on file    Gets together: Not on file    Attends religious service: Not on file    Active member of club or organization: Not on file    Attends meetings of clubs or organizations: Not on file     Relationship status: Not on file  Other Topics Concern   Not on file  Social History Narrative   Lives at home with his nephew.  Unsteady gait.     Family History:  The patient's family history includes Cancer in his brother and father; Uterine cancer in his mother.  ROS:   Review of Systems  Constitutional: Positive for malaise/fatigue. Negative for chills, diaphoresis, fever and weight loss.  HENT: Negative for congestion.   Eyes: Negative for discharge and redness.  Respiratory: Negative for cough, hemoptysis, sputum production, shortness of breath and wheezing.   Cardiovascular: Positive for leg swelling. Negative for chest pain, palpitations, orthopnea, claudication and PND.  Gastrointestinal: Positive for diarrhea. Negative for abdominal pain, blood in stool, constipation, heartburn, melena, nausea and vomiting.  Genitourinary: Negative for hematuria.  Musculoskeletal: Negative for falls and myalgias.  Skin: Negative for rash.  Neurological: Positive for weakness. Negative for dizziness, tingling, tremors, sensory change, speech change, focal weakness and loss of consciousness.  Endo/Heme/Allergies: Does not bruise/bleed easily.  Psychiatric/Behavioral: Negative for substance abuse. The patient is not nervous/anxious.   All other systems reviewed and are negative.    EKGs/Labs/Other Studies Reviewed:    Studies reviewed were summarized above. The additional studies were reviewed today:  2D Echo 12/2018: - Left ventricle: The cavity size was at the upper limits of normal. Wall thickness was normal. Systolic function was moderately to severely reduced. The estimated ejection fraction was in the range of 30% to 35%. Diffuse hypokinesis. Regional wall motion abnormalities cannot be excluded. Features are consistent with a pseudonormal left ventricular filling pattern, with concomitant abnormal relaxation and increased filling pressure (grade 2 diastolic  dysfunction). Doppler parameters are consistent with high ventricular filling pressure. - Aortic valve: Trileaflet; mildly thickened leaflets. - Mitral valve: Mildly thickened leaflets . There was mild regurgitation. - Left atrium: The atrium was moderately dilated. - Right ventricle: The cavity size was normal. Systolic function was moderately reduced. - Pericardium, extracardiac: A trivial pericardial effusion was identified. There was a right pleural effusion. __________  [facility] 01/2019: 1. Near normal coronary arteries with no obstructive disease. 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  2. Right heart catheterization showed moderately elevated filling pressures, moderate pulmonary hypertension and normal cardiac output.  Recommendations: The patient has nonischemic cardiomyopathy for which I recommend continuing medical therapy. He continues to be volume overloaded and  thus I will go ahead and start him on small dose furosemide with potassium. __________  ReDs vest: 46% (42% 1 week prior)   EKG:  EKG is ordered today.  The EKG ordered today demonstrates NSR, 75 bpm, low voltage QRS, nonspecific st/t changes  Recent Labs: 01/05/2019: B Natriuretic Peptide 2,927.0 01/07/2019: TSH 6.424 01/13/2019: Magnesium 2.0 02/05/2019: ALT 14 10/12/2019: BUN 25; Creatinine, Ser 1.37; Hemoglobin 10.8; Platelets 158; Potassium 3.8; Sodium 145  Recent Lipid Panel No results found for: CHOL, TRIG, HDL, CHOLHDL, VLDL, LDLCALC, LDLDIRECT  PHYSICAL EXAM:    VS:  BP 120/64 (BP Location: Left Arm, Patient Position: Sitting, Cuff Size: Normal)    Pulse 76    Temp 98.1 F (36.7 C)    Ht 5' 7"  (1.702 m)    Wt 171 lb 8 oz (77.8 kg)    BMI 26.86 kg/m   BMI: Body mass index is 26.86 kg/m.  Physical Exam  Constitutional: He is oriented to person, place, and time. He appears well-developed and well-nourished.  HENT:  Head: Normocephalic and  atraumatic.  Eyes: Right eye exhibits no discharge. Left eye exhibits no discharge.  Neck: Normal range of motion. JVD present.  JVD elevated approximately 8 to 10 cm  Cardiovascular: Normal rate, regular rhythm, S1 normal, S2 normal and normal heart sounds. Exam reveals no distant heart sounds, no friction rub, no midsystolic click and no opening snap.  No murmur heard. Pulmonary/Chest: Effort normal. No respiratory distress. He has no decreased breath sounds. He has no wheezes. He has rales in the right lower field and the left lower field. He exhibits no tenderness.  Abdominal: Soft. He exhibits distension. There is no abdominal tenderness.  Musculoskeletal:        General: Edema present.     Comments: 1-2+ bilateral lower extremity pitting edema to the mid thighs  Neurological: He is alert and oriented to person, place, and time.  Skin: Skin is warm and dry. No cyanosis. Nails show no clubbing.  Psychiatric: He has a normal mood and affect. His speech is normal and behavior is normal. Judgment and thought content normal.    Wt Readings from Last 3 Encounters:  10/19/19 171 lb 8 oz (77.8 kg)  10/12/19 178 lb 12 oz (81.1 kg)  04/08/19 141 lb (64 kg)     ASSESSMENT & PLAN:   1. Acute on chronic combined systolic and diastolic CHF/NICM/pulmonary hypertension: He remains volume up though weight is down just over 7 pounds when compared to his visit 1 week prior following outpatient diuresis.  He was noted to have mild AKI on labs checked last week which may have been in the setting of vascular congestion.  ReDs vest reading in the office today of 46% which is higher than 1 week prior when he had a reading of 42%.  That said, his weight is down 7 pounds as outlined above.  Check stat BMP this morning.  If renal function is worsening he will need to go to the ED for likely admission with IV Lasix and possible milrinone infusion secondary to cardiorenal syndrome.  If renal function remains stable  to improved would continue outpatient diuresis with escalation of diuretic therapy.  Further titration of diuretic is pending stat labs drawn this morning.  Lab results recommendations will be called to the patient's brother, [name], at [phone].  Continue current dose of losartan, pending renal function, as well as Toprol-XL.  CHF education.  2. AKI: Possibly in the setting of congestion secondary  to volume overload.  Cannot exclude some component of cardiorenal syndrome as well.  Check stat BMP today following outpatient diuresis.  3. Anemia: Stable Hgb on most recent CBC last week.  Follow-up with PCP as directed.  4. Chronic diarrhea: Symptoms persist.  He has been referred to GI multiple times.  Follow-up with PCP/GI.  5. Hypothyroidism: Has resumed levothyroxine.  Patient advised to follow-up with PCP.  Disposition: F/u with Dr. Fletcher Anon or an APP in 2 weeks, pending stat labs drawn today.   Medication Adjustments/Labs and Tests Ordered: Current medicines are reviewed at length with the patient today.  Concerns regarding medicines are outlined above. Medication changes, Labs and Tests ordered today are summarized above and listed in the Patient Instructions accessible in Encounters.   Signed, Christell Faith, PA-C 10/19/2019 8:37 AM     [facility] [address] [place], [place] [address] [phone]

## 2019-10-19 ENCOUNTER — Other Ambulatory Visit
Admission: RE | Admit: 2019-10-19 | Discharge: 2019-10-19 | Disposition: A | Discharge status: Home / Self Care | Payer: Medicare Other | Source: Ambulatory Visit | Attending: Physician Assistant | Admitting: Physician Assistant

## 2019-10-19 ENCOUNTER — Ambulatory Visit (INDEPENDENT_AMBULATORY_CARE_PROVIDER_SITE_OTHER): Payer: Medicare Other | Admitting: Physician Assistant

## 2019-10-19 ENCOUNTER — Encounter: Payer: Self-pay | Admitting: Physician Assistant

## 2019-10-19 ENCOUNTER — Other Ambulatory Visit: Payer: Self-pay

## 2019-10-19 VITALS — BP 120/64 | HR 76 | Temp 98.1°F | Ht 67.0 in | Wt 171.5 lb

## 2019-10-19 DIAGNOSIS — I5043 Acute on chronic combined systolic (congestive) and diastolic (congestive) heart failure: Secondary | ICD-10-CM

## 2019-10-19 DIAGNOSIS — I2721 Secondary pulmonary arterial hypertension: Secondary | ICD-10-CM | POA: Diagnosis not present

## 2019-10-19 DIAGNOSIS — I428 Other cardiomyopathies: Secondary | ICD-10-CM

## 2019-10-19 DIAGNOSIS — D649 Anemia, unspecified: Secondary | ICD-10-CM

## 2019-10-19 DIAGNOSIS — N179 Acute kidney failure, unspecified: Secondary | ICD-10-CM

## 2019-10-19 DIAGNOSIS — M7989 Other specified soft tissue disorders: Secondary | ICD-10-CM

## 2019-10-19 DIAGNOSIS — K529 Noninfective gastroenteritis and colitis, unspecified: Secondary | ICD-10-CM

## 2019-10-19 LAB — BASIC METABOLIC PANEL
Anion gap: 11 (ref 5–15)
BUN: 24 mg/dL — ABNORMAL HIGH (ref 8–23)
CO2: 34 mmol/L — ABNORMAL HIGH (ref 22–32)
Calcium: 8.8 mg/dL — ABNORMAL LOW (ref 8.9–10.3)
Chloride: 100 mmol/L (ref 98–111)
Creatinine, Ser: 1.28 mg/dL — ABNORMAL HIGH (ref 0.61–1.24)
GFR calc Af Amer: >60 mL/min (ref >60)
GFR calc non Af Amer: 56 mL/min — ABNORMAL LOW (ref >60)
Glucose, Bld: 101 mg/dL — ABNORMAL HIGH (ref 70–99)
Potassium: 4.2 mmol/L (ref 3.5–5.1)
Sodium: 145 mmol/L (ref 135–145)

## 2019-10-19 NOTE — Telephone Encounter (Signed)
Call to patient to discuss lab results from this morning and POC from Christell Faith, PA>   Pt verbalized understanding and will go to the medical mall in 1 week.   Pt verbalized understanding and agreeable to POC.   Advised pt to call for any further questions or concerns.

## 2019-10-19 NOTE — Patient Instructions (Signed)
Medication Instructions:  Your physician recommends that you continue on your current medications as directed. Please refer to the Current Medication list given to you today.  *If you need a refill on your cardiac medications before your next appointment, please call your pharmacy*  Lab Work: Your physician recommends that you return for lab work today at the medical mall. ([facility])  No appt is needed. Hours are M-F 7AM- 6 PM.  If you have labs (blood work) drawn today and your tests are completely normal, you will receive your results only by: Marland Kitchen MyChart Message (if you have MyChart) OR . A paper copy in the mail If you have any lab test that is abnormal or we need to change your treatment, we will call you to review the results.  Testing/Procedures: None ordered   Follow-Up: At [facility], you and your health needs are our priority.  As part of our continuing mission to provide you with exceptional heart care, we have created designated Provider Care Teams.  These Care Teams include your primary Cardiologist (physician) and Advanced Practice Providers (APPs -  Physician Assistants and Nurse Practitioners) who all work together to provide you with the care you need, when you need it.  Your next appointment:   2 weeks  The format for your next appointment:   In Person  Provider:    You may see [name], MD or Christell Faith, PA-C.

## 2019-10-19 NOTE — Telephone Encounter (Signed)
-----   Message from Rise Mu, PA-C sent at 10/19/2019 10:03 AM EDT ----- Renal function is improving with outpatient diuresis. Potassium is at goal.  Recommendations: -Please have the patient increase his Lasix to 40 mg daily for 5 days -Continue KCl 20 mEq for 5 days -On day 6, reduce Lasix to 20 mg daily and KCl to 10 mEq daily -Recheck BMP in 1 week -Follow-up in 2 weeks

## 2019-10-26 ENCOUNTER — Other Ambulatory Visit
Admission: RE | Admit: 2019-10-26 | Discharge: 2019-10-26 | Disposition: A | Discharge status: Home / Self Care | Payer: Medicare Other | Source: Ambulatory Visit | Attending: Physician Assistant | Admitting: Physician Assistant

## 2019-10-26 ENCOUNTER — Telehealth: Payer: Self-pay

## 2019-10-26 DIAGNOSIS — I5042 Chronic combined systolic (congestive) and diastolic (congestive) heart failure: Secondary | ICD-10-CM | POA: Insufficient documentation

## 2019-10-26 LAB — BASIC METABOLIC PANEL
Anion gap: 14 (ref 5–15)
BUN: 27 mg/dL — ABNORMAL HIGH (ref 8–23)
CO2: 31 mmol/L (ref 22–32)
Calcium: 9.2 mg/dL (ref 8.9–10.3)
Chloride: 103 mmol/L (ref 98–111)
Creatinine, Ser: 1.44 mg/dL — ABNORMAL HIGH (ref 0.61–1.24)
GFR calc Af Amer: 56 mL/min — ABNORMAL LOW (ref >60)
GFR calc non Af Amer: 48 mL/min — ABNORMAL LOW (ref >60)
Glucose, Bld: 112 mg/dL — ABNORMAL HIGH (ref 70–99)
Potassium: 4.1 mmol/L (ref 3.5–5.1)
Sodium: 148 mmol/L — ABNORMAL HIGH (ref 135–145)

## 2019-10-26 NOTE — Telephone Encounter (Signed)
-----   Message from William Mu, PA-C sent at 10/26/2019 12:36 PM EST ----- Following outpatient diuresis there was a slight bump in his renal function.  Potassium is at goal.  That said, he should now be on lower dose Lasix 20 mg daily.  He should continue lower dose Lasix and follow-up as planned next week at which time we will repeat renal function.

## 2019-10-26 NOTE — Telephone Encounter (Signed)
Call to patient for results. Busy signal.

## 2019-10-27 NOTE — Telephone Encounter (Signed)
Call to patient to discuss lab results and POC. Spoke with brother, [name].   He voiced understanding of results and agreeable to POC>   No further questions at this time.   Advised pt to call for any further questions or concerns.

## 2019-10-27 NOTE — Telephone Encounter (Signed)
-----   Message from Rise Mu, PA-C sent at 10/26/2019 12:36 PM EST ----- Following outpatient diuresis there was a slight bump in his renal function.  Potassium is at goal.  That said, he should now be on lower dose Lasix 20 mg daily.  He should continue lower dose Lasix and follow-up as planned next week at which time we will repeat renal function.

## 2019-11-04 ENCOUNTER — Other Ambulatory Visit: Payer: Self-pay

## 2019-11-04 ENCOUNTER — Encounter: Payer: Self-pay | Admitting: Nurse Practitioner

## 2019-11-04 ENCOUNTER — Ambulatory Visit (INDEPENDENT_AMBULATORY_CARE_PROVIDER_SITE_OTHER): Payer: Medicare Other | Admitting: Nurse Practitioner

## 2019-11-04 VITALS — BP 130/70 | HR 85 | Ht 66.0 in | Wt 146.5 lb

## 2019-11-04 DIAGNOSIS — I5043 Acute on chronic combined systolic (congestive) and diastolic (congestive) heart failure: Secondary | ICD-10-CM

## 2019-11-04 DIAGNOSIS — I5042 Chronic combined systolic (congestive) and diastolic (congestive) heart failure: Secondary | ICD-10-CM

## 2019-11-04 DIAGNOSIS — N1831 Chronic kidney disease, stage 3a: Secondary | ICD-10-CM

## 2019-11-04 NOTE — Patient Instructions (Signed)
Medication Instructions:  Your physician recommends that you continue on your current medications as directed. Please refer to the Current Medication list given to you today.  *If you need a refill on your cardiac medications before your next appointment, please call your pharmacy*  Lab Work: Your physician recommends that you return for lab work in: [place].  If you have labs (blood work) drawn today and your tests are completely normal, you will receive your results only by: Marland Kitchen MyChart Message (if you have MyChart) OR . A paper copy in the mail If you have any lab test that is abnormal or we need to change your treatment, we will call you to review the results.  Testing/Procedures: none  Follow-Up: At [facility], you and your health needs are our priority.  As part of our continuing mission to provide you with exceptional heart care, we have created designated Provider Care Teams.  These Care Teams include your primary Cardiologist (physician) and Advanced Practice Providers (APPs -  Physician Assistants and Nurse Practitioners) who all work together to provide you with the care you need, when you need it.  Your next appointment:   6-8 weeks.   The format for your next appointment:   In Person  Provider:    You may see [name], MD or one of the following Advanced Practice Providers on your designated Care Team:    Murray Hodgkins, NP  Christell Faith, PA-C  Marrianne Mood, PA-C

## 2019-11-04 NOTE — Progress Notes (Signed)
Office Visit    Patient Name: William Holland Date of Encounter: 11/04/2019  Primary Care Provider:  Patient, No Pcp Per Primary Cardiologist:  [name], MD  Chief Complaint    74 y/o ? 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  Past Medical History    Past Medical History:  Diagnosis Date  . Anemia   . Bowel perforation ([place])   . Chronic combined systolic (congestive) and diastolic (congestive) heart failure (HCC)    a. 12/2018 Echo: EF of 30 to 35%, diffuse hypokinesis, grade 2 diastolic dysfunction, mild mitral regurgitation, moderately dilated left atrium, moderately reduced RV systolic function, trivial pericardial effusion was identified, along with a right pleural effusion  . Chronic diarrhea   . Diverticulosis, sigmoid 02/27/2018   Colonoscopy done on 02/27/2018 by Dr. Marius Ditch  . NICM (nonischemic cardiomyopathy) ([place])    a. 12/2018 Echo: Ef 30-35%, diff HK, Gr2 DD.  Marland Kitchen Nonobstructive CAD (coronary artery disease)    a. 01/2019 Cath: LM nl, LAD nl, LCX nl, RCA min irregs.  . Stuttering during school years    Past Surgical History:  Procedure Laterality Date  . APPENDECTOMY    . COLONOSCOPY WITH PROPOFOL N/A 02/27/2018   Procedure: COLONOSCOPY WITH PROPOFOL;  Surgeon: Lin Landsman, MD;  Location: [facility] ENDOSCOPY;  Service: Gastroenterology;  Laterality: N/A;  . EYE SURGERY    . RIGHT/LEFT HEART CATH AND CORONARY ANGIOGRAPHY N/A 02/09/2019   Procedure: RIGHT/LEFT HEART CATH AND CORONARY ANGIOGRAPHY;  Surgeon: Wellington Hampshire, MD;  Location: [place] CV LAB;  Service: Cardiovascular;  Laterality: N/A;    Allergies  No Known Allergies  History of Present Illness    73 y/o ? 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  He was admitted 12/2018 w/ a 12 month h/o watery diarrhea w/ 4-10 stools/day.  He was found to have prominent colon distension w/ CT of the abd and pelvis showing free air, suggestive of bowel perf.  He was also volume overloaded, but hemodynamically stable.  Echo showed Ef of 30-35%, diff HK, Gr2 DD, mild MR, mod dil LA, and mod reduced RV fxn.  He was seen by surgery re: bowel perf, and repeat CT showed a small amt of intraperitoneal air, which was stable w/o contrast extravasation  conservative mgmt was recommended.  F/u cath 02/09/2019 showed minimal nonobs RCA dzs and otw nl cors.  He was maintained on prn lasix but when seen 10/12/2019, he was up 7 lbs from Feb and reported being out of lasix for 2-3 mos.  ReDs vest was abnl @ 42% and was lasix 71m daily was resumed x 5 days and then dropped to 274mdaily.  At f/u 10/26, wt was down 7 lbs but remained volume overloaded w/ ReDs vest reading of 46%.  As labs were stable, lasix was increased to 40 daily x 5 days.  F/u bmet 11/2 showed sl rise in creat to 1.44.  He has been on lasix 20 daily since.  Patient's brother is present with him today.  His weight today was 146.8 pounds, down 25 pounds since his last visit.  They have noted similar reduction in weight over the past 2 weeks on their home scale.  With reduction in weight, he has noted lessened although not complete resolution  of lower extremity swelling.  He continues to eat chicken noodle soup on a regular basis and also sits much of the day.  He denies any dyspnea, chest pain, palpitations, PND, orthopnea, dizziness, syncope, or early satiety.  He continues to take Lasix 20 mg daily and also continues to have multiple loose/watery stools throughout the day, which is normal for him.    Home Medications    Prior to Admission medications   Medication Sig Start Date End Date Taking? Authorizing Provider  acidophilus (RISAQUAD) CAPS capsule Take 1 capsule by mouth daily. 01/14/19   Gladstone Lighter, MD  furosemide (LASIX) 20 MG tablet Take 1 tablet (20 mg total) by mouth daily. 10/12/19 10/11/20  Rise Mu, PA-C  levothyroxine (SYNTHROID, LEVOTHROID) 50 MCG tablet Take 1 tablet (50 mcg total) by mouth daily before breakfast. 01/14/19   Gladstone Lighter, MD  loperamide (IMODIUM) 2 MG capsule Take 1 capsule (2 mg total) by mouth every 6 (six) hours as needed for diarrhea or loose stools. 01/13/19   Gladstone Lighter, MD  losartan (COZAAR) 25 MG tablet Take 0.5 tablets (12.5 mg total) by mouth daily. 02/05/19 10/19/19  Rise Mu, PA-C  metoprolol succinate (TOPROL XL) 25 MG 24 hr tablet Take 0.5 tablets (12.5 mg total) by mouth daily. Patient taking differently: Take 12.5 mg by mouth 2 (two) times daily.  01/23/19   Alisa Graff, FNP  potassium chloride SA (KLOR-CON) 20 MEQ tablet Take 0.5 tablets (10 mEq total) by mouth daily. Patient taking differently: Take 10 mEq by mouth 2 (two) times daily.  10/12/19   Dunn, Areta Haber, PA-C  tamsulosin (FLOMAX) 0.4 MG CAPS capsule Take 1 capsule (0.4 mg total) by mouth daily. 01/13/19   Gladstone Lighter, MD    Review of Systems    Mild lower extremity edema.  He has chronic diarrhea/watery stools.  He denies chest pain, dyspnea, palpitations, PND, orthopnea, dizziness, syncope, or early satiety.  All other systems reviewed and are otherwise negative except as noted above.  Physical Exam    VS:  BP 130/70 (BP Location: Left Arm, Patient Position: Sitting, Cuff Size: Normal)   Pulse 85   Ht 5' 6"  (1.676 m)   Wt 146 lb 8 oz (66.5 kg)   SpO2 95%   BMI 23.65 kg/m  , BMI Body mass index is 23.65 kg/m. GEN: Well nourished, well developed, in no acute distress. HEENT: normal. Neck: Supple, no JVD, carotid bruits, or masses. Cardiac: RRR, no murmurs, rubs, or gallops. No clubbing, cyanosis.  Trace bilateral lower extremity woody edema.  Radials/DP/PT 2+ and equal bilaterally.  Respiratory:  Respirations regular and unlabored, clear to  auscultation bilaterally. GI: Soft, nontender, nondistended, BS + x 4. MS: no deformity or atrophy. Skin: warm and dry, no rash. Neuro:  Strength and sensation are intact. Psych: Normal affect.  Accessory Clinical Findings    ECG personally reviewed by me today -regular sinus rhythm, 85 - no acute changes.  Lab Results  Component Value Date   WBC 7.9 10/12/2019   HGB 10.8 (L) 10/12/2019   HCT 35.7 (L) 10/12/2019   MCV 90.8 10/12/2019   PLT 158 10/12/2019   Lab Results  Component Value Date   CREATININE 1.44 (H) 10/26/2019   BUN 27 (H) 10/26/2019   NA 148 (H) 10/26/2019   K 4.1 10/26/2019   CL 103 10/26/2019   CO2 31 10/26/2019   Lab Results  Component Value Date   ALT 14 02/05/2019   AST  14 02/05/2019   ALKPHOS 112 02/05/2019   BILITOT 0.8 02/05/2019     Assessment & Plan    1.  Chronic combined systolic and diastolic congestive heart failure/nonischemic cardiomyopathy/pulmonary hypertension: Patient's weight is down 25 pounds since his last visit just 2 weeks ago.  He has been taking Lasix 20 mg daily.  Patient's brother is present with him today and says that they have been noticing his weight coming down on the home scale as well.  He continues to have mild lower extremity swelling but otherwise appears euvolemic on examination.  With weight loss, he has had significant symptomatic improvement.  Given significant weight loss, I will follow-up a basic metabolic panel today.  Heart rate and blood pressure stable on beta-blocker and ARB.  2.  Stage II-III chronic kidney disease: With prior acute kidney injury in the setting of diuresis.  Following up basic metabolic panel today in the setting of 25 pound weight loss over the past few weeks.  3.  Chronic diarrhea: Patient with 10+ watery stools a day which has been chronic and stable in nature.  He does not think that he has had any increase in diarrhea recently.  His appetite has been good.  4.  Disposition: Follow-up  basic metabolic panel today.  Follow-up in clinic in 4 to 6 weeks or sooner if necessary.  Murray Hodgkins, NP 11/04/2019, 12:03 PM

## 2019-11-05 ENCOUNTER — Telehealth: Payer: Self-pay | Admitting: Nurse Practitioner

## 2019-11-05 LAB — BASIC METABOLIC PANEL
BUN/Creatinine Ratio: 19 (ref 10–24)
BUN: 26 mg/dL (ref 8–27)
CO2: 28 mmol/L (ref 20–29)
Calcium: 9.2 mg/dL (ref 8.6–10.2)
Chloride: 102 mmol/L (ref 96–106)
Creatinine, Ser: 1.35 mg/dL — ABNORMAL HIGH (ref 0.76–1.27)
GFR calc Af Amer: 60 mL/min/{1.73_m2} (ref >59)
GFR calc non Af Amer: 52 mL/min/{1.73_m2} — ABNORMAL LOW (ref >59)
Glucose: 83 mg/dL (ref 65–99)
Potassium: 3.7 mmol/L (ref 3.5–5.2)
Sodium: 146 mmol/L — ABNORMAL HIGH (ref 134–144)

## 2019-11-05 MED ORDER — FUROSEMIDE 20 MG PO TABS: 10.0000 mg | ORAL_TABLET | Freq: Every day | ORAL | Status: DC

## 2019-11-05 NOTE — Telephone Encounter (Signed)
Notes recorded by Theora Gianotti, NP on 11/05/2019 at 9:16 AM EST  Renal fxn and lytes stable. Let's have him drop lasix to 45m daily given significant weight loss.

## 2019-11-05 NOTE — Telephone Encounter (Signed)
I spoke with the patient's brother [name] (ok per [facility]) regarding the patient's lab results. I have advised him of Ignacia Bayley, NP's recommendations to: 1) Decrease lasix 20 mg- take 1/2 tablet (10 mg) once daily  [name] voices understanding of the above recommendations and is agreeable.

## 2019-12-23 ENCOUNTER — Other Ambulatory Visit: Payer: Self-pay

## 2019-12-23 ENCOUNTER — Ambulatory Visit (INDEPENDENT_AMBULATORY_CARE_PROVIDER_SITE_OTHER): Payer: Medicare Other | Admitting: Nurse Practitioner

## 2019-12-23 ENCOUNTER — Encounter: Payer: Self-pay | Admitting: Nurse Practitioner

## 2019-12-23 VITALS — BP 120/60 | HR 74 | Ht 66.0 in | Wt 159.5 lb

## 2019-12-23 DIAGNOSIS — N182 Chronic kidney disease, stage 2 (mild): Secondary | ICD-10-CM

## 2019-12-23 DIAGNOSIS — I428 Other cardiomyopathies: Secondary | ICD-10-CM

## 2019-12-23 DIAGNOSIS — I5043 Acute on chronic combined systolic (congestive) and diastolic (congestive) heart failure: Secondary | ICD-10-CM | POA: Diagnosis not present

## 2019-12-23 MED ORDER — LOSARTAN POTASSIUM 25 MG PO TABS: 12.5000 mg | ORAL_TABLET | Freq: Every day | ORAL | 3 refills | Status: DC

## 2019-12-23 MED ORDER — FUROSEMIDE 20 MG PO TABS: 20.0000 mg | ORAL_TABLET | Freq: Every day | ORAL | 11 refills | Status: DC

## 2019-12-23 NOTE — Progress Notes (Signed)
Office Visit    Patient Name: William Holland Date of Encounter: 12/23/2019  Primary Care Provider:  Patient, No Pcp Per Primary Cardiologist:  [name], MD  Chief Complaint    73 y/o ? with a history of nonischemic cardiomyopathy, chronic combined systolic and diastolic congestive heart failure (EF 06-23%, grade 2 diastolic dysfunction), pulmonary arterial hypertension, diverticulitis, chronic diarrhea, anemia, and intellectual slowing, who presents for follow-up related to CHF.  Past Medical History    Past Medical History:  Diagnosis Date  . Anemia   . Bowel perforation ([place])   . Chronic combined systolic (congestive) and diastolic (congestive) heart failure (HCC)    a. 12/2018 Echo: EF of 30 to 35%, diffuse hypokinesis, grade 2 diastolic dysfunction, mild mitral regurgitation, moderately dilated left atrium, moderately reduced RV systolic function, trivial pericardial effusion was identified, along with a right pleural effusion  . Chronic diarrhea   . Diverticulosis, sigmoid 02/27/2018   Colonoscopy done on 02/27/2018 by Dr. Marius Ditch  . NICM (nonischemic cardiomyopathy) ([place])    a. 12/2018 Echo: Ef 30-35%, diff HK, Gr2 DD.  Marland Kitchen Nonobstructive CAD (coronary artery disease)    a. 01/2019 Cath: LM nl, LAD nl, LCX nl, RCA min irregs.  . Stuttering during school years    Past Surgical History:  Procedure Laterality Date  . APPENDECTOMY    . COLONOSCOPY WITH PROPOFOL N/A 02/27/2018   Procedure: COLONOSCOPY WITH PROPOFOL;  Surgeon: Lin Landsman, MD;  Location: [facility] ENDOSCOPY;  Service: Gastroenterology;  Laterality: N/A;  . EYE SURGERY    . RIGHT/LEFT HEART CATH AND CORONARY ANGIOGRAPHY N/A 02/09/2019   Procedure: RIGHT/LEFT HEART CATH AND CORONARY ANGIOGRAPHY;  Surgeon: Wellington Hampshire, MD;  Location: [place] CV LAB;  Service: Cardiovascular;  Laterality: N/A;    Allergies  No Known Allergies  History of Present Illness    73 year old male with the above past  medical history including nonischemic cardiomyopathy, chronic combined systolic and diastolic congestive heart failure with an EF of 30-35% and grade 2 diastolic dysfunction by echo in January 2020, pulmonary hypertension, diverticulitis, chronic diarrhea, anemia, and intellectual slowing.  He was admitted in January 2020 with a 73-monthhistory of watery diarrhea with 4-10 stools per day.  He was found to have prominent colon distention with CT of the abdomen and pelvis showing free air, suggestive of bowel perforation.  He was also volume overloaded, but hemodynamically stable.  Echo showed an EF of 30 to 35% with diffuse hypokinesis, grade 2 diastolic dysfunction, mild MR, moderately dilated left atrium, and moderately reduced RV function.  He was seen by surgery regarding bowel perforation and repeat CT showed a small amount of intraperitoneal air, which was stable without contrast extravasation and therefore, conservative management was recommended.  In the setting of LV dysfunction, he underwent diagnostic catheterization in February 2020 showing minimal nonobstructive RCA disease and otherwise normal coronary arteries.  He was medically managed and did well on as needed Lasix only but ran out of his Lasix over the summer and in that setting, when he was seen in October, he was volume overloaded with a ReDS Vest reading of 42%.  Lasix was resumed and at follow-up 1 week later, ReDS Vest remained elevated at 46%.  He was advised to take Lasix 40 mg daily for 5 days and then 20 mg daily.  When I saw him in clinic on November 11, his weight was down 25 pounds.  Electrolytes and renal function were stable and I asked him  to reduce his Lasix dose to 10 mg daily given significant weight loss and daily diarrhea.  His brother is present w/ him today.  Since his last visit, he feels as though he has done well however, his weight is up 13 pounds and he and his brother have noticed an increase in bilateral lower  extremity swelling.  He denies chest pain, palpitations, dyspnea, PND, orthopnea, dizziness, syncope, edema, or early satiety.  He has chronic daily diarrhea which is unchanged.  He has been compliant with Lasix and they try and keep a close eye on his salt intake.  He does not currently on a scale and therefore is not weighing daily.  Home Medications    Prior to Admission medications   Medication Sig Start Date End Date Taking? Authorizing Provider  acidophilus (RISAQUAD) CAPS capsule Take 1 capsule by mouth daily. Patient not taking: Reported on 11/04/2019 01/14/19   Gladstone Lighter, MD  furosemide (LASIX) 20 MG tablet Take 0.5 tablets (10 mg total) by mouth daily. 11/05/19 11/04/20  Theora Gianotti, NP  levothyroxine (SYNTHROID, LEVOTHROID) 50 MCG tablet Take 1 tablet (50 mcg total) by mouth daily before breakfast. Patient not taking: Reported on 11/04/2019 01/14/19   Gladstone Lighter, MD  losartan (COZAAR) 25 MG tablet Take 0.5 tablets (12.5 mg total) by mouth daily. 02/05/19 11/03/28  Rise Mu, PA-C  metoprolol succinate (TOPROL XL) 25 MG 24 hr tablet Take 0.5 tablets (12.5 mg total) by mouth daily. 01/23/19   Alisa Graff, FNP  potassium chloride SA (KLOR-CON) 20 MEQ tablet Take 0.5 tablets (10 mEq total) by mouth daily. Patient taking differently: Take 10 mEq by mouth 2 (two) times daily.  10/12/19   Rise Mu, PA-C    Review of Systems    He does not weigh himself regularly.  He has noticed increasing lower extremity edema over the past few weeks.  He denies chest pain, palpitations, dyspnea, pnd, orthopnea, n, v, dizziness, syncope, or early satiety.  All other systems reviewed and are otherwise negative except as noted above.  Physical Exam    VS:  BP 120/60 (BP Location: Left Arm, Patient Position: Sitting, Cuff Size: Normal)   Pulse 74   Ht 5' 6"  (1.676 m)   Wt 159 lb 8 oz (72.3 kg)   BMI 25.74 kg/m  , BMI Body mass index is 25.74 kg/m. GEN: Well  nourished, well developed, in no acute distress. HEENT: normal. Neck: Supple, no JVD, carotid bruits, or masses. Cardiac: RRR, no murmurs, rubs, or gallops. No clubbing, cyanosis, 2+ bilateral lower extremity edema.  Radials/PT 1+ and equal bilaterally.  Respiratory:  Respirations regular and unlabored, clear to auscultation bilaterally. GI: Protuberant and firm, nontender, BS + x 4. MS: no deformity or atrophy. Skin: warm and dry, no rash. Neuro:  Strength and sensation are intact. Psych: Normal affect.  Accessory Clinical Findings    ECG personally reviewed by me today -regular sinus rhythm, 74, right axis, nonspecific T changes - no acute changes.  Lab Results  Component Value Date   WBC 7.9 10/12/2019   HGB 10.8 (L) 10/12/2019   HCT 35.7 (L) 10/12/2019   MCV 90.8 10/12/2019   PLT 158 10/12/2019   Lab Results  Component Value Date   CREATININE 1.35 (H) 11/04/2019   BUN 26 11/04/2019   NA 146 (H) 11/04/2019   K 3.7 11/04/2019   CL 102 11/04/2019   CO2 28 11/04/2019   Lab Results  Component Value Date  ALT 14 02/05/2019   AST 14 02/05/2019   ALKPHOS 112 02/05/2019   BILITOT 0.8 02/05/2019     Lab Results  Component Value Date   HGBA1C 5.2 01/05/2019    Assessment & Plan    1.  Acute on chronic combined systolic diastolic congestive heart failure/nonischemic cardiomyopathy/pulmonary hypertension: EF of 30 to 35% by echo in January 2020 with minimal nonobstructive RCA disease on catheterization at that time.  When I saw him in November, his weight was down 25 pounds following escalation of diuretic therapy and also in the setting of diarrhea.  I backed down on his Lasix to 10 mg daily.  Unfortunately, he does not weigh himself and also has not been reporting symptoms, as his weight is up 13 pounds and he has increasing lower extremity swelling.  I have asked him to take 40 mg of Lasix daily x3 days and then resume 20 mg daily, as he tolerated that dose previously.  We  will plan to see him back in 1 week with a follow-up basic metabolic panel at that time.  Fortunately, he has not been experiencing any symptoms of dyspnea or orthopnea.  Heart rate and blood pressure well controlled on beta-blocker and ARB therapy.  2.  Stage II-III chronic kidney disease: With a history of acute kidney injury in the setting of diuresis in the past.  Prior basic metabolic panel in November showed stable renal function on Lasix 20 mg daily.  At the time, in the setting of daily diarrhea, and significant weight loss, I opted to reduce his furosemide to 10 mg daily however, it does not appear that he has been responding to this dose and as result, he requires increase diuresis as outlined above.  In the long run, I suspect he will need at least 20 mg daily.  3.  Chronic diarrhea: Persistent but stable.  4.  Disposition: Follow-up in clinic with 1 week with a basic metabolic panel at that time.   Murray Hodgkins, NP 12/23/2019, 4:37 PM

## 2019-12-23 NOTE — Patient Instructions (Signed)
Medication Instructions:  1- INCREASE Lasix to 2 tablets (40 mg total) once daily for 3 days, then 1 tablet (20 mg total) once daily thereafter *If you need a refill on your cardiac medications before your next appointment, please call your pharmacy*  Lab Work: None ordered  If you have labs (blood work) drawn today and your tests are completely normal, you will receive your results only by: Marland Kitchen MyChart Message (if you have MyChart) OR . A paper copy in the mail If you have any lab test that is abnormal or we need to change your treatment, we will call you to review the results.  Testing/Procedures: None ordered   Follow-Up: At [facility], you and your health needs are our priority.  As part of our continuing mission to provide you with exceptional heart care, we have created designated Provider Care Teams.  These Care Teams include your primary Cardiologist (physician) and Advanced Practice Providers (APPs -  Physician Assistants and Nurse Practitioners) who all work together to provide you with the care you need, when you need it.  Your next appointment:   As scheduled

## 2019-12-28 NOTE — Progress Notes (Signed)
Cardiology Office Note    Date:  12/31/2019   ID:  William Holland, DOB [birth year], MRN [id number]  PCP:  Patient, No Pcp Per  Cardiologist:  [name], MD  Electrophysiologist:  None   Chief Complaint: Follow-up  History of Present Illness:   William Holland is a 74 y.o. 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  He was admitted to the hospital in 12/2018 with a 21-monthhistory of watery diarrhea with 4-10 loose stools per day.  He was found to have prominent colon distention with CT of the abdomen and pelvis showing free air suggestive of bowel perforation.  However, he was hemodynamically stable.  He was significantly volume overloaded.  Echo showed an EF of 30 to 35% with diffuse hypokinesis, grade 2 diastolic dysfunction, mild mitral vegetation, moderately dilated left atrium, moderately reduced RV systolic function.  He was evaluated by surgery regarding his bowel perforation with repeat CT showing a small amount of intraperitoneal air which was stable without contrast extravasation.  In this setting, conservative management was recommended.  With regards to his cardiomyopathy, he underwent diagnostic cardiac cath in 01/2019 which showed minimal nonobstructive RCA disease and otherwise normal coronary arteries.  He did well on medical management though ran out of his Lasix in the summer 2020.  When he was evaluated in 09/2019 he was volume overloaded with a ReDs vest reading of 42%.  Lasix was resumed with follow-up ReDs vest reading 1 week later remaining elevated at 46%.  With this, he was advised to take Lasix 40 mg daily for 5 days then 20 mg daily thereafter.  In follow-up on 11/11 his weight was down 25 pounds with electrolytes and renal function noted to be stable.  In this  setting, his Lasix was reduced to 10 mg daily given significant weight loss and ongoing daily diarrhea.  He was last seen in the office on 12/23/2019 and was feeling relatively well though his weight was up 13 pounds when compared to his visit in mid 10/2019 with increase in bilateral lower extremity swelling.  He reported compliance with Lasix and was trying to limit his salt intake.  He was not weighing daily at home.  He was advised to take Lasix 40 mg daily for 3 days then resume 20 mg daily thereafter.  He comes in accompanied by his brother today.  He is doing well from a cardiac perspective.  His weight is down 6 pounds when compared to his last visit.  He notes significant improvement in lower extremity swelling and abdominal distention.  He has stable two-pillow orthopnea.  He has been compliant with Lasix 20 mg daily.  He continues to have several loose stools per day.  No chest pain, shortness of breath, palpitations, dizziness, presyncope, or syncope.  Labs independently reviewed: 10/2019 - BUN 26, serum creatinine 1.35, potassium 3.7 09/2019 - Hgb 10.8 PLT 158 01/2019 - albumin 3.7, AST/ALT normal 12/2018 - magnesium 2.0, TSH elevated at 6.424, free T4 low at 0.78, free T3 low at 1.3, A1c 5.2  Past Medical History:  Diagnosis Date  . Anemia   . Bowel perforation ([place]   . Chronic combined systolic (congestive) and diastolic (congestive) heart failure (HCC)    a. 12/2018 Echo: EF of 30 to 35%, diffuse hypokinesis, grade 2 diastolic dysfunction, mild mitral regurgitation, moderately dilated  left atrium, moderately reduced RV systolic function, trivial pericardial effusion was identified, along with a right pleural effusion  . Chronic diarrhea   . Diverticulosis, sigmoid 02/27/2018   Colonoscopy done on 02/27/2018 by Dr. Marius Ditch  . NICM (nonischemic cardiomyopathy) ([place])    a. 12/2018 Echo: Ef 30-35%, diff HK, Gr2 DD.  Marland Kitchen Nonobstructive CAD (coronary artery disease)    a. 01/2019 Cath: LM nl,  LAD nl, LCX nl, RCA min irregs.  . Stuttering during school years     Past Surgical History:  Procedure Laterality Date  . APPENDECTOMY    . COLONOSCOPY WITH PROPOFOL N/A 02/27/2018   Procedure: COLONOSCOPY WITH PROPOFOL;  Surgeon: Lin Landsman, MD;  Location: [facility] END[facility]COPY;  Service: Gastroenterology;  Laterality: N/A;  . EYE SURGERY    . RIGHT/LEFT HEART CATH AND CORONARY ANGIOGRAPHY N/A 02/09/2019   Procedure: RIGHT/LEFT HEART CATH AND CORONARY ANGIOGRAPHY;  Surgeon: Wellington Hampshire, MD;  Location: [facility] LAB;  Service: Cardiovascular;  Laterality: N/A;    Current Medications: Current Meds  Medication Sig  . acidophilus (RISAQUAD) CAPS capsule Take 1 capsule by mouth daily.  . furosemide (LASIX) 20 MG tablet Take 1 tablet (20 mg total) by mouth daily.  Marland Kitchen levothyroxine (SYNTHROID, LEVOTHROID) 50 MCG tablet Take 1 tablet (50 mcg total) by mouth daily before breakfast.  . losartan (COZAAR) 25 MG tablet Take 0.5 tablets (12.5 mg total) by mouth daily.  . metoprolol succinate (TOPROL XL) 25 MG 24 hr tablet Take 0.5 tablets (12.5 mg total) by mouth daily.  . potassium chloride SA (KLOR-CON) 20 MEQ tablet Take 0.5 tablets (10 mEq total) by mouth daily. (Patient taking differently: Take 10 mEq by mouth 2 (two) times daily. )    Allergies:   Patient has no known allergies.   Social History   Socioeconomic History  . Marital status: Single    Spouse name: Not on file  . Number of children: Not on file  . Years of education: Not on file  . Highest education level: Not on file  Occupational History  . Occupation: retired  Tobacco Use  . Smoking status: Never Smoker  . Smokeless tobacco: Never Used  Substance and Sexual Activity  . Alcohol use: No    Alcohol/week: 0.0 standard drinks  . Drug use: No  . Sexual activity: Not on file    Comment: Not Asked  Other Topics Concern  . Not on file  Social History Narrative   Lives at home with his nephew.  Unsteady  gait.   Social Determinants of Health   Financial Resource Strain:   . Difficulty of Paying Living Expenses: Not on file  Food Insecurity:   . Worried About Charity fundraiser in the Last Year: Not on file  . Ran Out of Food in the Last Year: Not on file  Transportation Needs:   . Lack of Transportation (Medical): Not on file  . Lack of Transportation (Non-Medical): Not on file  Physical Activity:   . Days of Exercise per Week: Not on file  . Minutes of Exercise per Session: Not on file  Stress:   . Feeling of Stress : Not on file  Social Connections:   . Frequency of Communication with Friends and Family: Not on file  . Frequency of Social Gatherings with Friends and Family: Not on file  . Attends Religious Services: Not on file  . Active Member of Clubs or Organizations: Not on file  . Attends Archivist  Meetings: Not on file  . Marital Status: Not on file     Family History:  The patient's family history includes Cancer in his brother and father; Uterine cancer in his mother.  ROS:   Review of Systems  Constitutional: Positive for malaise/fatigue. Negative for chills, diaphoresis, fever and weight loss.  HENT: Negative for congestion.   Eyes: Negative for discharge and redness.  Respiratory: Negative for cough, hemoptysis, sputum production, shortness of breath and wheezing.   Cardiovascular: Positive for orthopnea and leg swelling. Negative for chest pain, palpitations, claudication and PND.       Improved lower extremity swelling. Stable two-pillow orthopnea.  Gastrointestinal: Positive for diarrhea. Negative for abdominal pain, blood in stool, constipation, heartburn, melena, nausea and vomiting.  Genitourinary: Negative for hematuria.  Musculoskeletal: Negative for falls and myalgias.  Skin: Negative for rash.  Neurological: Positive for weakness. Negative for dizziness, tingling, tremors, sensory change, speech change, focal weakness and loss of  consciousness.  Endo/Heme/Allergies: Does not bruise/bleed easily.  Psychiatric/Behavioral: Negative for substance abuse. The patient is not nervous/anxious.   All other systems reviewed and are negative.    EKGs/Labs/Other Studies Reviewed:    Studies reviewed were summarized above. The additional studies were reviewed today:  2D Echo 12/2018: - Left ventricle: The cavity size was at the upper limits of normal. Wall thickness was normal. Systolic function was moderately to severely reduced. The estimated ejection fraction was in the range of 30% to 35%. Diffuse hypokinesis. Regional wall motion abnormalities cannot be excluded. Features are consistent with a pseudonormal left ventricular filling pattern, with concomitant abnormal relaxation and increased filling pressure (grade 2 diastolic dysfunction). Doppler parameters are consistent with high ventricular filling pressure. - Aortic valve: Trileaflet; mildly thickened leaflets. - Mitral valve: Mildly thickened leaflets . There was mild regurgitation. - Left atrium: The atrium was moderately dilated. - Right ventricle: The cavity size was normal. Systolic function was moderately reduced. - Pericardium, extracardiac: A trivial pericardial effusion was identified. There was a right pleural effusion. __________  [facility] 01/2019: 1. Near normal coronary arteries with no obstructive disease. 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  2. Right heart catheterization showed moderately elevated filling pressures, moderate pulmonary hypertension and normal cardiac output.  Recommendations: The patient has nonischemic cardiomyopathy for which I recommend continuing medical therapy. He continues to be volume overloaded and thus I will go ahead and start him on small dose furosemide with potassium.   EKG:  EKG is ordered today.  The EKG ordered today demonstrates  NSR with sinus arrhythmia and rare PVC, 78 bpm, nonspecific ST-T changes  Recent Labs: 01/05/2019: B Natriuretic Peptide 2,927.0 01/07/2019: TSH 6.424 01/13/2019: Magnesium 2.0 02/05/2019: ALT 14 10/12/2019: Hemoglobin 10.8; Platelets 158 11/04/2019: BUN 26; Creatinine, Ser 1.35; Potassium 3.7; Sodium 146  Recent Lipid Panel No results found for: CHOL, TRIG, HDL, CHOLHDL, VLDL, LDLCALC, LDLDIRECT  PHYSICAL EXAM:    VS:  BP (!) 110/54 (BP Location: Left Arm, Patient Position: Sitting, Cuff Size: Normal)   Pulse 78   Ht 5' 6"  (1.676 m)   Wt 153 lb 8 oz (69.6 kg)   SpO2 97%   BMI 24.78 kg/m   BMI: Body mass index is 24.78 kg/m.  Physical Exam  Constitutional: He is oriented to person, place, and time. He appears well-developed and well-nourished.  HENT:  Head: Normocephalic and atraumatic.  Eyes: Right eye exhibits no discharge. Left eye exhibits no discharge.  Neck: No JVD present.  Cardiovascular: Normal rate, regular rhythm, S1 normal, S2 normal and normal heart sounds. Exam reveals no distant heart sounds, no friction rub, no midsystolic click and no opening snap.  No murmur heard. Pulses:      Posterior tibial pulses are 2+ on the right side and 2+ on the left side.  Pulmonary/Chest: Effort normal and breath sounds normal. No respiratory distress. He has no decreased breath sounds. He has no wheezes. He has no rales. He exhibits no tenderness.  Abdominal: Soft. He exhibits no distension. There is no abdominal tenderness.  Musculoskeletal:        General: Edema present.     Cervical back: Normal range of motion.     Comments: Trace bilateral pretibial edema.  Neurological: He is alert and oriented to person, place, and time.  Skin: Skin is warm and dry. No cyanosis. Nails show no clubbing.  Psychiatric: He has a normal mood and affect. His speech is normal and behavior is normal. Judgment and thought content normal.    Wt Readings from Last 3 Encounters:  12/31/19 153 lb 8  oz (69.6 kg)  12/23/19 159 lb 8 oz (72.3 kg)  11/04/19 146 lb 8 oz (66.5 kg)     ASSESSMENT & PLAN:   1. Chronic combined systolic and diastolic CHF/NICM/pulmonary hypertension: volume status is significantly improved.  Diuresis has occurred in the setting of medication escalation as well as longstanding chronic watery diarrhea.  Seems to be tolerating Lasix 20 mg daily which will be continued at this time.  Check BMP as outlined below.  Continue losartan and KCl repletion.  Not currently on spironolactone in the setting of underlying CKD with fluctuating renal function in the setting of intermittent escalation of diuresis and watery diarrhea.  Consider repeat echo and follow-up to evaluate for improvement in LV systolic function.  If EF remains less than 35% at that time consider referral to EP for consideration of ICD.  2. Stage II-III CKD: Patient has had a history of AKI in the setting of diuresis with longstanding watery diarrhea in the past.  Most recent BMP showed a stable renal function.  Given her recent outpatient diuresis check BMP today to assess for stable renal function and potassium on Lasix 20 mg daily.  3. Chronic diarrhea: Persistent and stable.  Follow-up with PCP.  Disposition: F/u with Dr. Fletcher Anon or an APP in 1 month.   Medication Adjustments/Labs and Tests Ordered: Current medicines are reviewed at length with the patient today.  Concerns regarding medicines are outlined above. Medication changes, Labs and Tests ordered today are summarized above and listed in the Patient Instructions accessible in Encounters.   Signed, Christell Faith, PA-C 12/31/2019 10:16 AM     [facility] - [address] [place], [place] [address] [phone]

## 2019-12-31 ENCOUNTER — Ambulatory Visit (INDEPENDENT_AMBULATORY_CARE_PROVIDER_SITE_OTHER): Payer: Medicare Other | Admitting: Physician Assistant

## 2019-12-31 ENCOUNTER — Other Ambulatory Visit: Payer: Self-pay

## 2019-12-31 ENCOUNTER — Encounter: Payer: Self-pay | Admitting: Physician Assistant

## 2019-12-31 VITALS — BP 110/54 | HR 78 | Ht 66.0 in | Wt 153.5 lb

## 2019-12-31 DIAGNOSIS — I5042 Chronic combined systolic (congestive) and diastolic (congestive) heart failure: Secondary | ICD-10-CM

## 2019-12-31 DIAGNOSIS — I428 Other cardiomyopathies: Secondary | ICD-10-CM

## 2019-12-31 DIAGNOSIS — I5043 Acute on chronic combined systolic (congestive) and diastolic (congestive) heart failure: Secondary | ICD-10-CM | POA: Diagnosis not present

## 2019-12-31 DIAGNOSIS — I2721 Secondary pulmonary arterial hypertension: Secondary | ICD-10-CM | POA: Diagnosis not present

## 2019-12-31 DIAGNOSIS — N182 Chronic kidney disease, stage 2 (mild): Secondary | ICD-10-CM

## 2019-12-31 DIAGNOSIS — K529 Noninfective gastroenteritis and colitis, unspecified: Secondary | ICD-10-CM

## 2019-12-31 NOTE — Patient Instructions (Signed)
Medication Instructions:  Your physician recommends that you continue on your current medications as directed. Please refer to the Current Medication list given to you today.  *If you need a refill on your cardiac medications before your next appointment, please call your pharmacy*  Lab Work: Your physician recommends that you have lab work today(BMET)   If you have labs (blood work) drawn today and your tests are completely normal, you will receive your results only by: Marland Kitchen MyChart Message (if you have MyChart) OR . A paper copy in the mail If you have any lab test that is abnormal or we need to change your treatment, we will call you to review the results.  Testing/Procedures: None ordered   Follow-Up: At [facility], you and your health needs are our priority.  As part of our continuing mission to provide you with exceptional heart care, we have created designated Provider Care Teams.  These Care Teams include your primary Cardiologist (physician) and Advanced Practice Providers (APPs -  Physician Assistants and Nurse Practitioners) who all work together to provide you with the care you need, when you need it.  Your next appointment:   1 month(s)  The format for your next appointment:   In Person  Provider:    You may see [name], MD or Christell Faith, PA-C.

## 2020-01-01 ENCOUNTER — Telehealth: Payer: Self-pay

## 2020-01-01 LAB — BASIC METABOLIC PANEL
BUN/Creatinine Ratio: 18 (ref 10–24)
BUN: 24 mg/dL (ref 8–27)
CO2: 35 mmol/L — ABNORMAL HIGH (ref 20–29)
Calcium: 8.8 mg/dL (ref 8.6–10.2)
Chloride: 102 mmol/L (ref 96–106)
Creatinine, Ser: 1.37 mg/dL — ABNORMAL HIGH (ref 0.76–1.27)
GFR calc Af Amer: 59 mL/min/{1.73_m2} — ABNORMAL LOW (ref >59)
GFR calc non Af Amer: 51 mL/min/{1.73_m2} — ABNORMAL LOW (ref >59)
Glucose: 63 mg/dL — ABNORMAL LOW (ref 65–99)
Potassium: 2.9 mmol/L — ABNORMAL LOW (ref 3.5–5.2)
Sodium: 150 mmol/L — ABNORMAL HIGH (ref 134–144)

## 2020-01-01 NOTE — Telephone Encounter (Signed)
-----   Message from Rise Mu, PA-C sent at 01/01/2020  7:26 AM EST ----- Renal function is stable.  Potassium is significantly low.  Please have him take KCl 40 mEq bid x 2 today, then resume at 20 mEq daily thereafter.  Recheck BMET on 01/04/2020.   Please call his brother Ndrew Creason to discuss results.

## 2020-01-01 NOTE — Telephone Encounter (Signed)
Call to patient to discuss lab results and POC.   Spoke to brother, [name], okay per DPR.  He verbalized understanding and will be [facility] for recheck. He will also have patient increase KCl as advised.   No further questions at this time.  Advised pt to call for any further questions or concerns.

## 2020-01-07 ENCOUNTER — Telehealth: Payer: Self-pay

## 2020-01-07 ENCOUNTER — Other Ambulatory Visit
Admission: RE | Admit: 2020-01-07 | Discharge: 2020-01-07 | Disposition: A | Discharge status: Home / Self Care | Payer: Medicare Other | Source: Ambulatory Visit | Attending: Physician Assistant | Admitting: Physician Assistant

## 2020-01-07 DIAGNOSIS — N182 Chronic kidney disease, stage 2 (mild): Secondary | ICD-10-CM | POA: Diagnosis present

## 2020-01-07 LAB — BASIC METABOLIC PANEL
Anion gap: 10 (ref 5–15)
BUN: 33 mg/dL — ABNORMAL HIGH (ref 8–23)
CO2: 30 mmol/L (ref 22–32)
Calcium: 9 mg/dL (ref 8.9–10.3)
Chloride: 105 mmol/L (ref 98–111)
Creatinine, Ser: 1.49 mg/dL — ABNORMAL HIGH (ref 0.61–1.24)
GFR calc Af Amer: 54 mL/min — ABNORMAL LOW (ref >60)
GFR calc non Af Amer: 46 mL/min — ABNORMAL LOW (ref >60)
Glucose, Bld: 98 mg/dL (ref 70–99)
Potassium: 3.7 mmol/L (ref 3.5–5.1)
Sodium: 145 mmol/L (ref 135–145)

## 2020-01-07 MED ORDER — FUROSEMIDE 20 MG PO TABS: ORAL_TABLET | ORAL | 11 refills | Status: DC

## 2020-01-07 NOTE — Telephone Encounter (Signed)
Call to patient to review result.   Reviewed POC and Rx update. Brother, William Holland verbalized understanding and had no further questions at this time.

## 2020-01-07 NOTE — Telephone Encounter (Signed)
-----   Message from Rise Mu, PA-C sent at 01/07/2020 11:07 AM EST ----- Potassium is significantly improved and close to goal.  Renal function is mildly elevated though relatively consistent with recent readings.  Please have patient alternate Lasix with 20 mg daily and 10 mg every other day.  He should take KCl 10 mEq daily moving forward.  Follow-up as planned next month.  Please see patient's brothers result note, Asael Pann prior to contacting the patient.  His result note should be discussed with [name].

## 2020-01-07 NOTE — Addendum Note (Signed)
Addended by: Verlon Au on: 01/07/2020 09:50 AM   Modules accepted: Orders

## 2020-01-12 ENCOUNTER — Other Ambulatory Visit: Payer: Self-pay | Admitting: Family

## 2020-01-15 ENCOUNTER — Other Ambulatory Visit: Payer: Self-pay | Admitting: Family

## 2020-01-15 ENCOUNTER — Other Ambulatory Visit: Payer: Self-pay

## 2020-01-15 MED ORDER — METOPROLOL SUCCINATE ER 25 MG PO TB24: 12.5000 mg | ORAL_TABLET | Freq: Every day | ORAL | 5 refills | Status: DC

## 2020-02-05 NOTE — Progress Notes (Deleted)
Cardiology Office Note    Date:  02/05/2020   ID:  William Holland, DOB Nov 05, 1946, MRN [id number]  PCP:  Patient, No Pcp Per  Cardiologist:  [name], MD  Electrophysiologist:  None   Chief Complaint: Follow up  History of Present Illness:   William Holland is a 74 y.o. 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  He was admitted to the hospital in 12/2018 with a 84-monthhistory of watery diarrhea with 4-10 loose stools per day.  He was found to have prominent colon distention with CT of the abdomen and pelvis showing free air suggestive of bowel perforation.  However, he was hemodynamically stable.  He was significantly volume overloaded.  Echo showed an EF of 30 to 35% with diffuse hypokinesis, grade 2 diastolic dysfunction, mild mitral vegetation, moderately dilated left atrium, moderately reduced RV systolic function.  He was evaluated by surgery regarding his bowel perforation with repeat CT showing a small amount of intraperitoneal air which was stable without contrast extravasation.  In this setting, conservative management was recommended.  With regards to his cardiomyopathy, he underwent diagnostic cardiac cath in 01/2019 which showed minimal nonobstructive RCA disease and otherwise normal coronary arteries.  He did well on medical management though ran out of his Lasix in the summer 2020.  When he was evaluated in 09/2019 he was volume overloaded with a ReDs vest reading of 42%.  Lasix was resumed with follow-up ReDs vest reading 1 week later remaining elevated at 46%.  With this, he was advised to take Lasix 40 mg daily for 5 days then 20 mg daily thereafter.  In follow-up in 11/20, his weight was down 25 pounds with electrolytes and renal function noted to be stable.  In this setting, his  Lasix was reduced to 10 mg daily given significant weight loss and ongoing daily diarrhea.  He was seen in 11/2019 and was feeling relatively well though his weight was up 13 pounds when compared to his visit in mid 10/2019 with increase in bilateral lower extremity swelling.  He reported compliance with Lasix and was trying to limit his salt intake.  He was not weighing daily at home.  He was advised to take Lasix 40 mg daily for 3 days then resume 20 mg daily thereafter.  He was last seen in the office in 12/2019 and was doing well.  His weight was down 6 pounds compared to his visit in 11/2019 with significant improvement in lower extremity swelling and abdominal distension.  He was ultimately transitioned to Lasix 20 mg alternating with 10 mg daily given slight elevations in follow up renal function.  ***   Labs independently reviewed: 12/2019 - potassium 3.7, BUN 33, SCr 1.49 09/2019 - Hgb 10.8 PLT 158 01/2019 - albumin 3.7, AST/ALT normal 12/2018 - magnesium 2.0, TSH elevated at 6.424, free T4 low at 0.78, free T3 low at 1.3, A1c 5.2  Past Medical History:  Diagnosis Date   Anemia    Bowel perforation (HCC)    Chronic combined systolic (congestive) and diastolic (congestive) heart failure ([facility]    a. 12/2018 Echo: EF of 30 to 35%, diffuse hypokinesis, grade 2 diastolic dysfunction, mild mitral regurgitation, moderately dilated left atrium, moderately reduced RV systolic function, trivial pericardial effusion was identified, along with a right pleural effusion   Chronic diarrhea  Diverticulosis, sigmoid 02/27/2018   Colonoscopy done on 02/27/2018 by Dr. Marius Ditch   [facility]    a. 12/2018 Echo: Ef 30-35%, diff HK, Gr2 DD.   Nonobstructive CAD (coronary artery disease)    a. 01/2019 Cath: LM nl, LAD nl, LCX nl, RCA min irregs.   Stuttering during school years     Past Surgical History:  Procedure Laterality Date   APPENDECTOMY     COLONOSCOPY WITH  PROPOFOL N/A 02/27/2018   Procedure: COLONOSCOPY WITH PROPOFOL;  Surgeon: Lin Landsman, MD;  Location: [facility] ENDOSCOPY;  Service: Gastroenterology;  Laterality: N/A;   EYE SURGERY     RIGHT/LEFT HEART CATH AND CORONARY ANGIOGRAPHY N/A 02/09/2019   Procedure: RIGHT/LEFT HEART CATH AND CORONARY ANGIOGRAPHY;  Surgeon: Wellington Hampshire, MD;  Location: [facility] CV LAB;  Service: Cardiovascular;  Laterality: N/A;    Current Medications: No outpatient medications have been marked as taking for the 02/15/20 encounter (Appointment) with Rise Mu, PA-C.    Allergies:   Patient has no known allergies.   Social History   Socioeconomic History   Marital status: Single    Spouse name: Not on file   Number of children: Not on file   Years of education: Not on file   Highest education level: Not on file  Occupational History   Occupation: retired  Tobacco Use   Smoking status: Never Smoker   Smokeless tobacco: Never Used  Substance and Sexual Activity   Alcohol use: No    Alcohol/week: 0.0 standard drinks   Drug use: No   Sexual activity: Not on file    Comment: Not Asked  Other Topics Concern   Not on file  Social History Narrative   Lives at home with his nephew.  Unsteady gait.   Social Determinants of Health   Financial Resource Strain:    Difficulty of Paying Living Expenses: Not on file  Food Insecurity:    Worried About Charity fundraiser in the Last Year: Not on file   [employer] of Food in the Last Year: Not on file  Transportation Needs:    Lack of Transportation (Medical): Not on file   Lack of Transportation (Non-Medical): Not on file  Physical Activity:    Days of Exercise per Week: Not on file   Minutes of Exercise per Session: Not on file  Stress:    Feeling of Stress : Not on file  Social Connections:    Frequency of Communication with Friends and Family: Not on file   Frequency of Social Gatherings with Friends and Family: Not on  file   Attends Religious Services: Not on file   Active Member of Clubs or Organizations: Not on file   Attends Archivist Meetings: Not on file   Marital Status: Not on file     Family History:  The patient's family history includes Cancer in his brother and father; Uterine cancer in his mother.  ROS:   ROS   EKGs/Labs/Other Studies Reviewed:    Studies reviewed were summarized above. The additional studies were reviewed today:  2D Echo 12/2018: - Left ventricle: The cavity size was at the upper limits of normal. Wall thickness was normal. Systolic function was moderately to severely reduced. The estimated ejection fraction was in the range of 30% to 35%. Diffuse hypokinesis. Regional wall motion abnormalities cannot be excluded. Features are consistent with a pseudonormal left ventricular filling pattern, with concomitant abnormal relaxation and increased filling pressure (grade  2 diastolic dysfunction). Doppler parameters are consistent with high ventricular filling pressure. - Aortic valve: Trileaflet; mildly thickened leaflets. - Mitral valve: Mildly thickened leaflets . There was mild regurgitation. - Left atrium: The atrium was moderately dilated. - Right ventricle: The cavity size was normal. Systolic function was moderately reduced. - Pericardium, extracardiac: A trivial pericardial effusion was identified. There was a right pleural effusion. __________  [facility] 01/2019: 1. Near normal coronary arteries with no obstructive disease. 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  2. Right heart catheterization showed moderately elevated filling pressures, moderate pulmonary hypertension and normal cardiac output.  Recommendations: The patient has nonischemic cardiomyopathy for which I recommend continuing medical therapy. He continues to be volume overloaded and thus I will go ahead  and start him on small dose furosemide with potassium.   EKG:  EKG is ordered today.  The EKG ordered today demonstrates ***  Recent Labs: 10/12/2019: Hemoglobin 10.8; Platelets 158 01/07/2020: BUN 33; Creatinine, Ser 1.49; Potassium 3.7; Sodium 145  Recent Lipid Panel No results found for: CHOL, TRIG, HDL, CHOLHDL, VLDL, LDLCALC, LDLDIRECT  PHYSICAL EXAM:    VS:  There were no vitals taken for this visit.  BMI: There is no height or weight on file to calculate BMI.  Physical Exam  Wt Readings from Last 3 Encounters:  12/31/19 153 lb 8 oz (69.6 kg)  12/23/19 159 lb 8 oz (72.3 kg)  11/04/19 146 lb 8 oz (66.5 kg)     ASSESSMENT & PLAN:   1. ***  Disposition: F/u with Dr. Fletcher Anon or an APP in ***.   Medication Adjustments/Labs and Tests Ordered: Current medicines are reviewed at length with the patient today.  Concerns regarding medicines are outlined above. Medication changes, Labs and Tests ordered today are summarized above and listed in the Patient Instructions accessible in Encounters.   Signed, Christell Faith, PA-C 02/05/2020 12:16 PM     [address] [place], [place] [address] [phone]

## 2020-02-15 ENCOUNTER — Ambulatory Visit: Payer: Medicare Other | Admitting: Physician Assistant

## 2020-02-17 ENCOUNTER — Other Ambulatory Visit: Payer: Self-pay

## 2020-02-17 ENCOUNTER — Encounter: Payer: Self-pay | Admitting: Physician Assistant

## 2020-02-17 ENCOUNTER — Ambulatory Visit (INDEPENDENT_AMBULATORY_CARE_PROVIDER_SITE_OTHER): Payer: Medicare Other | Admitting: Physician Assistant

## 2020-02-17 VITALS — BP 100/70 | HR 77 | Ht 66.0 in | Wt 146.5 lb

## 2020-02-17 DIAGNOSIS — N182 Chronic kidney disease, stage 2 (mild): Secondary | ICD-10-CM | POA: Diagnosis not present

## 2020-02-17 DIAGNOSIS — I5042 Chronic combined systolic (congestive) and diastolic (congestive) heart failure: Secondary | ICD-10-CM

## 2020-02-17 DIAGNOSIS — I428 Other cardiomyopathies: Secondary | ICD-10-CM

## 2020-02-17 DIAGNOSIS — I2721 Secondary pulmonary arterial hypertension: Secondary | ICD-10-CM

## 2020-02-17 DIAGNOSIS — Z79899 Other long term (current) drug therapy: Secondary | ICD-10-CM

## 2020-02-17 DIAGNOSIS — K529 Noninfective gastroenteritis and colitis, unspecified: Secondary | ICD-10-CM

## 2020-02-17 NOTE — Progress Notes (Signed)
Cardiology Office Note    Date:  02/17/2020   ID:  William Holland, DOB 1946-08-21, MRN [id number]  PCP:  Patient, No Pcp Per  Cardiologist:  [name], MD  Electrophysiologist:  None   Chief Complaint: Follow up  History of Present Illness:   William Holland is a 74 y.o. 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  He was admitted to the hospital in 12/2018 with a 48-monthhistory of watery diarrhea with 4-10 loose stools per day.  He was found to have prominent colon distention with CT of the abdomen and pelvis showing free air suggestive of bowel perforation.  However, he was hemodynamically stable.  He was significantly volume overloaded.  Echo showed an EF of 30 to 35% with diffuse hypokinesis, grade 2 diastolic dysfunction, mild mitral vegetation, moderately dilated left atrium, moderately reduced RV systolic function.  He was evaluated by surgery regarding his bowel perforation with repeat CT showing a small amount of intraperitoneal air which was stable without contrast extravasation.  In this setting, conservative management was recommended.  With regards to his cardiomyopathy, he underwent diagnostic cardiac cath in 01/2019 which showed minimal nonobstructive RCA disease and otherwise normal coronary arteries.  He did well on medical management though ran out of his Lasix in the summer 2020.  When he was evaluated in 09/2019 he was volume overloaded with a ReDs vest reading of 42%.  Lasix was resumed with follow-up ReDs vest reading 1 week later remaining elevated at 46%.  With this, he was advised to take Lasix 40 mg daily for 5 days then 20 mg daily thereafter.  In follow-up in 11/20, his weight was down 25 pounds with electrolytes and renal function noted to be stable.  In this  setting, his Lasix was reduced to 10 mg daily given significant weight loss and ongoing daily diarrhea.  He was seen in 11/2019 and was feeling relatively well though his weight was up 13 pounds when compared to his visit in mid 10/2019 with increase in bilateral lower extremity swelling.  He reported compliance with Lasix and was trying to limit his salt intake.  He was not weighing daily at home.  He was advised to take Lasix 40 mg daily for 3 days then resume 20 mg daily thereafter.  He was last seen in the office in 12/2019 and was doing well.  His weight was down 6 pounds compared to his visit in 11/2019 with significant improvement in lower extremity swelling and abdominal distension.  He was ultimately transitioned to Lasix 20 mg alternating with 10 mg daily given slight elevations in follow up renal function.  He comes in today accompanied by his brother.  He is doing quite well from a cardiac perspective.  His weight is down another 6 pounds when compared to his last visit.  He notes stable, trace lower extremity swelling with decreased abdominal distention.  He has stable two-pillow orthopnea.  He denies any chest pain, shortness of breath, palpitations, dizziness, presyncope, syncope, falls, hematochezia, or melena.  He is tolerating Lasix 20 mg daily alternating with 10 mg daily.  He continues to have watery diarrhea in the setting of colonic stricture which would inhibit transit by formed stool, per his brother.  He is not eating a diet high in salt or adding any salt to his food.  He is pleased with his improvement.  He does not have any issues or concerns at this time.   Labs independently reviewed: 12/2019 - potassium 3.7, BUN 33, SCr 1.49 09/2019 - Hgb 10.8 PLT 158 01/2019 - albumin 3.7, AST/ALT normal 12/2018 - magnesium 2.0, TSH elevated at 6.424, free T4 low at 0.78, free T3 low at 1.3, A1c 5.2    Past Medical History:  Diagnosis Date  . Anemia   . Bowel perforation ([place])   . Chronic  combined systolic (congestive) and diastolic (congestive) heart failure (HCC)    a. 12/2018 Echo: EF of 30 to 35%, diffuse hypokinesis, grade 2 diastolic dysfunction, mild mitral regurgitation, moderately dilated left atrium, moderately reduced RV systolic function, trivial pericardial effusion was identified, along with a right pleural effusion  . Chronic diarrhea   . Diverticulosis, sigmoid 02/27/2018   Colonoscopy done on 02/27/2018 by Dr. Marius Ditch  . NICM (nonischemic cardiomyopathy) ([place])    a. 12/2018 Echo: Ef 30-35%, diff HK, Gr2 DD.  [facility] Kitchen Nonobstructive CAD (coronary artery disease)    a. 01/2019 Cath: LM nl, LAD nl, LCX nl, RCA min irregs.  . Stuttering during school years     Past Surgical History:  Procedure Laterality Date  . APPENDECTOMY    . COLONOSCOPY WITH PROPOFOL N/A 02/27/2018   Procedure: COLONOSCOPY WITH PROPOFOL;  Surgeon: Lin Landsman, MD;  Location: [facility] ENDOSCOPY;  Service: Gastroenterology;  Laterality: N/A;  . EYE SURGERY    . RIGHT/LEFT HEART CATH AND CORONARY ANGIOGRAPHY N/A 02/09/2019   Procedure: RIGHT/LEFT HEART CATH AND CORONARY ANGIOGRAPHY;  Surgeon: Wellington Hampshire, MD;  Location: [facility] CV LAB;  Service: Cardiovascular;  Laterality: N/A;    Current Medications: Current Meds  Medication Sig  . acidophilus (RISAQUAD) CAPS capsule Take 1 capsule by mouth daily.  . furosemide (LASIX) 20 MG tablet Alternate taking 1 tablet (20 mg total) once daily, then take 0.5 tablet (10 mg total) the next day.  . levothyroxine (SYNTHROID, LEVOTHROID) 50 MCG tablet Take 1 tablet (50 mcg total) by mouth daily before breakfast.  . losartan (COZAAR) 25 MG tablet Take 0.5 tablets (12.5 mg total) by mouth daily.  . metoprolol succinate (TOPROL XL) 25 MG 24 hr tablet Take 0.5 tablets (12.5 mg total) by mouth daily.  . potassium chloride SA (KLOR-CON) 20 MEQ tablet Take 0.5 tablets (10 mEq total) by mouth daily. (Patient taking differently: Take 10 mEq by mouth 2 (two) times  daily. )    Allergies:   Patient has no known allergies.   Social History   Socioeconomic History  . Marital status: Single    Spouse name: Not on file  . Number of children: Not on file  . Years of education: Not on file  . Highest education level: Not on file  Occupational History  . Occupation: retired  Tobacco Use  . Smoking status: Never Smoker  . Smokeless tobacco: Never Used  Substance and Sexual Activity  . Alcohol use: No    Alcohol/week: 0.0 standard drinks  . Drug use: No  . Sexual activity: Not on file    Comment: Not Asked  Other Topics Concern  . Not on file  Social History Narrative   Lives at home with his nephew.  Unsteady gait.   Social Determinants of Health   Financial Resource Strain:   . Difficulty of Paying Living Expenses: Not on file  Food Insecurity:   . Worried About Charity fundraiser in the Last Year: Not on  file  . [place] in the Last Year: Not on file  Transportation Needs:   . Lack of Transportation (Medical): Not on file  . Lack of Transportation (Non-Medical): Not on file  Physical Activity:   . Days of Exercise per Week: Not on file  . Minutes of Exercise per Session: Not on file  Stress:   . Feeling of Stress : Not on file  Social Connections:   . Frequency of Communication with Friends and Family: Not on file  . Frequency of Social Gatherings with Friends and Family: Not on file  . Attends Religious Services: Not on file  . Active Member of Clubs or Organizations: Not on file  . Attends Archivist Meetings: Not on file  . Marital Status: Not on file     Family History:  The patient's family history includes Cancer in his brother and father; Uterine cancer in his mother.  ROS:   Review of Systems  Constitutional: Positive for malaise/fatigue. Negative for chills, diaphoresis, fever and weight loss.  HENT: Negative for congestion.   Eyes: Negative for discharge and redness.  Respiratory: Negative for  cough, hemoptysis, sputum production, shortness of breath and wheezing.   Cardiovascular: Negative for chest pain, palpitations, orthopnea, claudication, leg swelling and PND.  Gastrointestinal: Positive for diarrhea. Negative for abdominal pain, blood in stool, constipation, heartburn, melena, nausea and vomiting.       Patient has a longstanding history of chronic watery diarrhea in the setting of colonic stricture which would inhibit transit of formed stool, per his brother  Genitourinary: Negative for hematuria.  Musculoskeletal: Negative for falls and myalgias.  Skin: Negative for rash.  Neurological: Positive for weakness. Negative for dizziness, tingling, tremors, sensory change, speech change, focal weakness and loss of consciousness.  Endo/Heme/Allergies: Does not bruise/bleed easily.  Psychiatric/Behavioral: Negative for substance abuse. The patient is not nervous/anxious.   All other systems reviewed and are negative.    EKGs/Labs/Other Studies Reviewed:    Studies reviewed were summarized above. The additional studies were reviewed today:  2D Echo 12/2018: - Left ventricle: The cavity size was at the upper limits of normal. Wall thickness was normal. Systolic function was moderately to severely reduced. The estimated ejection fraction was in the range of 30% to 35%. Diffuse hypokinesis. Regional wall motion abnormalities cannot be excluded. Features are consistent with a pseudonormal left ventricular filling pattern, with concomitant abnormal relaxation and increased filling pressure (grade 2 diastolic dysfunction). Doppler parameters are consistent with high ventricular filling pressure. - Aortic valve: Trileaflet; mildly thickened leaflets. - Mitral valve: Mildly thickened leaflets . There was mild regurgitation. - Left atrium: The atrium was moderately dilated. - Right ventricle: The cavity size was normal. Systolic function was moderately  reduced. - Pericardium, extracardiac: A trivial pericardial effusion was identified. There was a right pleural effusion. __________  [facility] 01/2019: 1. Near normal coronary arteries with no obstructive disease. 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  2. Right heart catheterization showed moderately elevated filling pressures, moderate pulmonary hypertension and normal cardiac output.  Recommendations: The patient has nonischemic cardiomyopathy for which I recommend continuing medical therapy. He continues to be volume overloaded and thus I will go ahead and start him on small dose furosemide with potassium.   EKG:  EKG is ordered today.  The EKG ordered today demonstrates NSR with sinus arrhythmia, 77 bpm, LVH with early repolarization abnormality, nonspecific inferior lateral ST-T changes slightly more pronounced  when compared to prior  Recent Labs: 10/12/2019: Hemoglobin 10.8; Platelets 158 01/07/2020: BUN 33; Creatinine, Ser 1.49; Potassium 3.7; Sodium 145  Recent Lipid Panel No results found for: CHOL, TRIG, HDL, CHOLHDL, VLDL, LDLCALC, LDLDIRECT  PHYSICAL EXAM:    VS:  BP 100/70 (BP Location: Left Arm, Patient Position: Sitting, Cuff Size: Normal)   Pulse 77   Ht 5' 6"  (1.676 m)   Wt 146 lb 8 oz (66.5 kg)   SpO2 97%   BMI 23.65 kg/m   BMI: Body mass index is 23.65 kg/m.  Physical Exam  Constitutional: He is oriented to person, place, and time. He appears well-developed and well-nourished.  HENT:  Head: Normocephalic and atraumatic.  Eyes: Right eye exhibits no discharge. Left eye exhibits no discharge.  Neck: No JVD present.  Cardiovascular: Normal rate, regular rhythm, S1 normal, S2 normal and normal heart sounds. Exam reveals no distant heart sounds, no friction rub, no midsystolic click and no opening snap.  No murmur heard. Pulses:      Posterior tibial pulses are 2+ on the right side and 2+ on the left side.   Pulmonary/Chest: Effort normal and breath sounds normal. No respiratory distress. He has no decreased breath sounds. He has no wheezes. He has no rales. He exhibits no tenderness.  Abdominal: Soft. He exhibits no distension. There is no abdominal tenderness.  Musculoskeletal:        General: Edema present.     Cervical back: Normal range of motion.     Comments: Trace bilateral pretibial edema  Neurological: He is alert and oriented to person, place, and time.  Skin: Skin is warm and dry. No cyanosis. Nails show no clubbing.  Psychiatric: He has a normal mood and affect. His speech is normal and behavior is normal. Judgment and thought content normal.    Wt Readings from Last 3 Encounters:  02/17/20 146 lb 8 oz (66.5 kg)  12/31/19 153 lb 8 oz (69.6 kg)  12/23/19 159 lb 8 oz (72.3 kg)     ASSESSMENT & PLAN:   1. Chronic combined systolic and diastolic CHF/NICM/pulmonary hypertension: He appears euvolemic and well compensated.  Diuresis and him has been multifactorial including intermittent titration of diuretic therapy as well as in the setting of longstanding chronic watery diarrhea.  He is tolerating Lasix 20 mg daily alternating with 10 mg daily with KCl 10 mEq daily.  Check BMP today to ensure stable renal function and potassium.  He will otherwise continue current dose of Toprol-XL and losartan.  Relative hypotension precludes further escalation of GDMT, including transition from losartan to [facility], addition of spironolactone, or titration of his current therapy.  Check limited echo to evaluate for improvement in LV systolic function following maximally tolerated evidence-based therapy.  If his EF remains less than 35% at that time consider referral to EP for possible ICD.  2. CKD stage II-III: Most recent renal function relatively stable as outlined above.  Recheck BMP today.  Of note, given his longstanding watery diarrhea this will need to be accounted for with continued diuretic  usage.  3. Chronic diarrhea: Persistent and stable.  Follow-up with PCP as directed.  Disposition: F/u with Dr. Fletcher Anon or an APP in 2 months.   Medication Adjustments/Labs and Tests Ordered: Current medicines are reviewed at length with the patient today.  Concerns regarding medicines are outlined above. Medication changes, Labs and Tests ordered today are summarized above and listed in the Patient Instructions accessible in Encounters.   Signed, Thurmond Butts  Gurneet Matarese, PA-C 02/17/2020 1:50 PM     [place] [place] [address] [place], [place] [address] [phone]

## 2020-02-17 NOTE — Patient Instructions (Signed)
Medication Instructions:  Your physician recommends that you continue on your current medications as directed. Please refer to the Current Medication list given to you today.  *If you need a refill on your cardiac medications before your next appointment, please call your pharmacy*  Lab Work: Your physician recommends that you return for lab work in: [place].   If you have labs (blood work) drawn today and your tests are completely normal, you will receive your results only by: Marland Kitchen MyChart Message (if you have MyChart) OR . A paper copy in the mail If you have any lab test that is abnormal or we need to change your treatment, we will call you to review the results.  Testing/Procedures: Your physician has requested that you have an echocardiogram. Echocardiography is a painless test that uses sound waves to create images of your heart. It provides your doctor with information about the size and shape of your heart and how well your heart's chambers and valves are working. This procedure takes approximately one hour. There are no restrictions for this procedure. You may get an IV, if needed, to receive an ultrasound enhancing agent through to better visualize your heart.   Follow-Up: At [facility], you and your health needs are our priority.  As part of our continuing mission to provide you with exceptional heart care, we have created designated Provider Care Teams.  These Care Teams include your primary Cardiologist (physician) and Advanced Practice Providers (APPs -  Physician Assistants and Nurse Practitioners) who all work together to provide you with the care you need, when you need it.  Your next appointment:   2 month(s)  The format for your next appointment:   In Person  Provider:    You may see [name], MD or one of the following Advanced Practice Providers on your designated Care Team:    Murray Hodgkins, NP  Christell Faith, PA-C  Marrianne Mood,  PA-C   Echocardiogram An echocardiogram is a procedure that uses painless sound waves (ultrasound) to produce an image of the heart. Images from an echocardiogram can provide important information about:  Signs of coronary artery disease (CAD).  Aneurysm detection. An aneurysm is a weak or damaged part of an artery wall that bulges out from the normal force of blood pumping through the body.  Heart size and shape. Changes in the size or shape of the heart can be associated with certain conditions, including heart failure, aneurysm, and CAD.  Heart muscle function.  Heart valve function.  Signs of a past heart attack.  Fluid buildup around the heart.  Thickening of the heart muscle.  A tumor or infectious growth around the heart valves. Tell a health care provider about:  Any allergies you have.  All medicines you are taking, including vitamins, herbs, eye drops, creams, and over-the-counter medicines.  Any blood disorders you have.  Any surgeries you have had.  Any medical conditions you have.  Whether you are pregnant or may be pregnant. What are the risks? Generally, this is a safe procedure. However, problems may occur, including:  Allergic reaction to dye (contrast) that may be used during the procedure. What happens before the procedure? No specific preparation is needed. You may eat and drink normally. What happens during the procedure?   An IV tube may be inserted into one of your veins.  You may receive contrast through this tube. A contrast is an injection that improves the quality of the pictures from your heart.  A gel will be applied to your chest.  A wand-like tool (transducer) will be moved over your chest. The gel will help to transmit the sound waves from the transducer.  The sound waves will harmlessly bounce off of your heart to allow the heart images to be captured in real-time motion. The images will be recorded on a computer. The procedure  may vary among health care providers and hospitals. What happens after the procedure?  You may return to your normal, everyday life, including diet, activities, and medicines, unless your health care provider tells you not to do that. Summary  An echocardiogram is a procedure that uses painless sound waves (ultrasound) to produce an image of the heart.  Images from an echocardiogram can provide important information about the size and shape of your heart, heart muscle function, heart valve function, and fluid buildup around your heart.  You do not need to do anything to prepare before this procedure. You may eat and drink normally.  After the echocardiogram is completed, you may return to your normal, everyday life, unless your health care provider tells you not to do that. This information is not intended to replace advice given to you by your health care provider. Make sure you discuss any questions you have with your health care provider. Document Revised: 04/02/2019 Document Reviewed: 01/12/2017 Elsevier Patient Education  [place].

## 2020-02-18 ENCOUNTER — Telehealth: Payer: Self-pay

## 2020-02-18 LAB — BASIC METABOLIC PANEL
BUN/Creatinine Ratio: 18 (ref 10–24)
BUN: 27 mg/dL (ref 8–27)
CO2: 27 mmol/L (ref 20–29)
Calcium: 9 mg/dL (ref 8.6–10.2)
Chloride: 104 mmol/L (ref 96–106)
Creatinine, Ser: 1.51 mg/dL — ABNORMAL HIGH (ref 0.76–1.27)
GFR calc Af Amer: 52 mL/min/{1.73_m2} — ABNORMAL LOW (ref >59)
GFR calc non Af Amer: 45 mL/min/{1.73_m2} — ABNORMAL LOW (ref >59)
Glucose: 101 mg/dL — ABNORMAL HIGH (ref 65–99)
Potassium: 3.7 mmol/L (ref 3.5–5.2)
Sodium: 146 mmol/L — ABNORMAL HIGH (ref 134–144)

## 2020-02-18 NOTE — Telephone Encounter (Signed)
-----   Message from Theora Gianotti, NP sent at 02/18/2020  9:41 AM EST ----- Renal fxn and lytes relatively stable.  No changes to meds. Encourage oral hydration. F/u bmet in 2 wks to ensure ongoing stability.

## 2020-02-18 NOTE — Telephone Encounter (Signed)
Call to brother [name] to review lab results. He verbalized understanding.   Confirmed order is currently in mychart for repeat in 2 weeks.   Advised pt to call for any further questions or concerns.

## 2020-02-25 ENCOUNTER — Ambulatory Visit: Payer: Medicare Other | Admitting: Urology

## 2020-02-25 ENCOUNTER — Other Ambulatory Visit: Payer: Self-pay

## 2020-02-25 VITALS — BP 134/68 | HR 116 | Ht 66.0 in | Wt 144.1 lb

## 2020-02-25 DIAGNOSIS — N210 Calculus in bladder: Secondary | ICD-10-CM

## 2020-02-25 LAB — BLADDER SCAN AMB NON-IMAGING

## 2020-02-25 NOTE — Progress Notes (Signed)
   02/25/2020 4:45 PM   William Holland 09/08/1946 [id number]  Reason for visit: Follow up bladder stones  HPI: I saw William Holland in urology clinic today for follow up of bladder stones.  He is here with his brother today who provides most of his care. 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  He was evaluated by general surgery.  Ultimately, surgery was deferred secondary to his extreme high risk with his medical co-morbidities and lack of symptoms with hemodynamic stability.  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  At our hospital visit follow up in March 2020, we had elected a conservative approach with observation for his numerous asymptomatic bladder stones. He was referred for a general surgery evaluation at a tertiary care center, but they never went to this referral as he was feeling better and they were afraid to go out during [place].  He continues to do surprisingly well with minimal urinary symptoms and no abdominal pain at this time.  He continues to have diarrhea.  He denies any debris or particles in his urine, UTIs, gross hematuria, or flank pain.  He denies any difficulty urinating.  PVR is normal at 14 mL today.  He will have some occasional incontinence overnight.  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  This puts him at high risk for infections and even retention.  We discussed that standard management would be abdominal surgery with general surgery to resect any abnormal bowel, remove the bladder stones, and close the bladder with need for catheter placement for at least 1 to 2 weeks.   They understand  these risks and would not like to pursue any aggressive intervention.  I do not think there is a role for endoscopic management of his impressive bladder stone burden, as he is asymptomatic and this would still expose him to moderate operative risk for minimal benefit.  RTC 1 year with PVR  I spent 20 total minutes on the day of the encounter including pre-visit review of the medical record, face-to-face time with the patient, and post visit ordering of labs/imaging/tests.  Billey Co, [facility] [address], [place], [place] [address] [phone]

## 2020-03-17 ENCOUNTER — Telehealth: Payer: Self-pay

## 2020-03-17 ENCOUNTER — Other Ambulatory Visit
Admission: RE | Admit: 2020-03-17 | Discharge: 2020-03-17 | Disposition: A | Discharge status: Home / Self Care | Payer: Medicare Other | Source: Ambulatory Visit | Attending: Physician Assistant | Admitting: Physician Assistant

## 2020-03-17 DIAGNOSIS — N182 Chronic kidney disease, stage 2 (mild): Secondary | ICD-10-CM | POA: Insufficient documentation

## 2020-03-17 DIAGNOSIS — I5022 Chronic systolic (congestive) heart failure: Secondary | ICD-10-CM

## 2020-03-17 LAB — BASIC METABOLIC PANEL
Anion gap: 12 (ref 5–15)
BUN: 47 mg/dL — ABNORMAL HIGH (ref 8–23)
CO2: 27 mmol/L (ref 22–32)
Calcium: 9.2 mg/dL (ref 8.9–10.3)
Chloride: 107 mmol/L (ref 98–111)
Creatinine, Ser: 2.16 mg/dL — ABNORMAL HIGH (ref 0.61–1.24)
GFR calc Af Amer: 34 mL/min — ABNORMAL LOW (ref >60)
GFR calc non Af Amer: 29 mL/min — ABNORMAL LOW (ref >60)
Glucose, Bld: 102 mg/dL — ABNORMAL HIGH (ref 70–99)
Potassium: 4.3 mmol/L (ref 3.5–5.1)
Sodium: 146 mmol/L — ABNORMAL HIGH (ref 135–145)

## 2020-03-17 NOTE — Telephone Encounter (Signed)
Attempted to call patient. [facility] 03/17/2020

## 2020-03-17 NOTE — Telephone Encounter (Signed)
-----   Message from Rise Mu, PA-C sent at 03/17/2020  1:01 PM EDT ----- Renal function is slightly worse than prior and above his baseline. Potassium is stable and at goal.   Recommendations: -Please hold Lasix and KCl x 2 days -Resume Lasix at 20 mg and KCl 10 mEq every other day -Follow up BMET in 2 weeks

## 2020-03-21 MED ORDER — POTASSIUM CHLORIDE CRYS ER 20 MEQ PO TBCR: 10.0000 meq | EXTENDED_RELEASE_TABLET | ORAL | 3 refills | Status: DC

## 2020-03-21 MED ORDER — FUROSEMIDE 20 MG PO TABS: ORAL_TABLET | ORAL | 11 refills | Status: DC

## 2020-03-21 NOTE — Telephone Encounter (Signed)
Call to review labs and POC. Spoke to brother [name], okay per DPR.   He verbalized understanding and agreeable to POC.   Updated orders.  Advised pt to call for any further questions or concerns.

## 2020-03-22 ENCOUNTER — Other Ambulatory Visit: Payer: Self-pay

## 2020-03-22 MED ORDER — FUROSEMIDE 20 MG PO TABS: ORAL_TABLET | ORAL | 11 refills | Status: DC

## 2020-03-22 NOTE — Telephone Encounter (Signed)
I spoke to pharmacy today stated patient has picked up an old prescription for Fursomide 20 mg daily. Patient last OV note states for paitent to Take Furosemide 20 mg tablet qd alternating 0.5 mg tablet daily. Pharmacy requesting to verify correct instructions/dose and needs new Rx for correction.  Please advise.  ----- Message ----- From: Casimer Lanius, CMA Sent: 03/22/2020  11:46 AM EDT To: [facility] Triage  Refill requested Furosemide 20MG  SIG: TAKE 1 TABLET (20MG TOTAL) ONCE DAILY EVERY OTHER DAY Please verify how pt is taking it  Thank you

## 2020-03-22 NOTE — Telephone Encounter (Signed)
-  Please hold Lasix and KCl x 2 days -Resume Lasix at 20 mg and KCl 10 mEq every other day -Follow up BMET in 2 weeks

## 2020-03-23 ENCOUNTER — Telehealth: Payer: Self-pay | Admitting: Nurse Practitioner

## 2020-03-23 MED ORDER — FUROSEMIDE 20 MG PO TABS: ORAL_TABLET | ORAL | 11 refills | Status: DC

## 2020-03-23 MED ORDER — FUROSEMIDE 20 MG PO TABS: 20.0000 mg | ORAL_TABLET | ORAL | 11 refills | Status: DC

## 2020-03-23 NOTE — Telephone Encounter (Signed)
Please contact patient with correct instructions and orders Thank you

## 2020-03-23 NOTE — Telephone Encounter (Signed)
Do you know any specific changes that were made? I see that APP refilled yesterday so wanted to check.

## 2020-03-23 NOTE — Telephone Encounter (Signed)
Pharmacy was calling to clarify dosage of patients furosemide. It was written take 20 mg once daily every other day. Sent in new prescription to read 20 mg every other day. Advised to please discontinue any other doses. She verbalized understanding and sent in new electronic script to reflect instructions needed.

## 2020-03-23 NOTE — Telephone Encounter (Signed)
Pharmacist is calling states that the directions for Furosemide is not correct. Please call to discuss.

## 2020-03-24 ENCOUNTER — Other Ambulatory Visit: Payer: Medicare Other

## 2020-03-28 ENCOUNTER — Other Ambulatory Visit
Admission: RE | Admit: 2020-03-28 | Discharge: 2020-03-28 | Disposition: A | Discharge status: Home / Self Care | Payer: Medicare Other | Source: Ambulatory Visit | Attending: Nurse Practitioner | Admitting: Nurse Practitioner

## 2020-03-28 ENCOUNTER — Telehealth: Payer: Self-pay

## 2020-03-28 DIAGNOSIS — I5022 Chronic systolic (congestive) heart failure: Secondary | ICD-10-CM | POA: Insufficient documentation

## 2020-03-28 LAB — BASIC METABOLIC PANEL
Anion gap: 9 (ref 5–15)
BUN: 54 mg/dL — ABNORMAL HIGH (ref 8–23)
CO2: 27 mmol/L (ref 22–32)
Calcium: 8.9 mg/dL (ref 8.9–10.3)
Chloride: 108 mmol/L (ref 98–111)
Creatinine, Ser: 2.1 mg/dL — ABNORMAL HIGH (ref 0.61–1.24)
GFR calc Af Amer: 35 mL/min — ABNORMAL LOW (ref >60)
GFR calc non Af Amer: 30 mL/min — ABNORMAL LOW (ref >60)
Glucose, Bld: 115 mg/dL — ABNORMAL HIGH (ref 70–99)
Potassium: 3.5 mmol/L (ref 3.5–5.1)
Sodium: 144 mmol/L (ref 135–145)

## 2020-03-28 NOTE — Telephone Encounter (Signed)
-----   Message from Rise Mu, PA-C sent at 03/28/2020 11:58 AM EDT ----- Renal function is improving though does continue to remain above his baseline.  Potassium is stable and low normal.  Please have him take Lasix 20 mg daily only on an as needed basis for weight gain greater than 3 pounds overnight or 5 pounds in a 7 day time span.  When he does take Lasix he should take KCl 10 mEq as well.  Recommend recheck BMET in 2 weeks to ensure renal function has returned back to his baseline.

## 2020-03-28 NOTE — Telephone Encounter (Signed)
Call attempted.   Cell having connectivity issues.

## 2020-04-04 MED ORDER — FUROSEMIDE 20 MG PO TABS: 20.0000 mg | ORAL_TABLET | Freq: Every day | ORAL | 11 refills | Status: DC | PRN

## 2020-04-04 MED ORDER — POTASSIUM CHLORIDE CRYS ER 20 MEQ PO TBCR: 10.0000 meq | EXTENDED_RELEASE_TABLET | Freq: Every day | ORAL | 3 refills | Status: DC | PRN

## 2020-04-04 NOTE — Telephone Encounter (Signed)
Call to patient to review labs.    Brother, William Holland verbalized understanding and has no further questions at this time. Agreeable to POC.   Advised pt to call for any further questions or concerns.  Orders updated as requested.

## 2020-04-16 ENCOUNTER — Other Ambulatory Visit
Admission: RE | Admit: 2020-04-16 | Discharge: 2020-04-16 | Disposition: A | Discharge status: Home / Self Care | Payer: Medicare Other | Source: Ambulatory Visit | Attending: Physician Assistant | Admitting: Physician Assistant

## 2020-04-16 ENCOUNTER — Encounter: Payer: Self-pay | Admitting: Physician Assistant

## 2020-04-16 DIAGNOSIS — I5022 Chronic systolic (congestive) heart failure: Secondary | ICD-10-CM

## 2020-04-16 LAB — BASIC METABOLIC PANEL
Anion gap: 9 (ref 5–15)
BUN: 52 mg/dL — ABNORMAL HIGH (ref 8–23)
CO2: 26 mmol/L (ref 22–32)
Calcium: 8.8 mg/dL — ABNORMAL LOW (ref 8.9–10.3)
Chloride: 108 mmol/L (ref 98–111)
Creatinine, Ser: 2.7 mg/dL — ABNORMAL HIGH (ref 0.61–1.24)
GFR calc Af Amer: 26 mL/min — ABNORMAL LOW (ref >60)
GFR calc non Af Amer: 22 mL/min — ABNORMAL LOW (ref >60)
Glucose, Bld: 113 mg/dL — ABNORMAL HIGH (ref 70–99)
Potassium: 3.4 mmol/L — ABNORMAL LOW (ref 3.5–5.1)
Sodium: 143 mmol/L (ref 135–145)

## 2020-04-17 ENCOUNTER — Encounter: Payer: Self-pay | Admitting: Physician Assistant

## 2020-04-18 ENCOUNTER — Ambulatory Visit: Payer: Medicare Other | Admitting: Physician Assistant

## 2020-04-18 ENCOUNTER — Telehealth: Payer: Self-pay

## 2020-04-18 DIAGNOSIS — N182 Chronic kidney disease, stage 2 (mild): Secondary | ICD-10-CM

## 2020-04-18 NOTE — Telephone Encounter (Signed)
Rise Mu, PA-C  Alroy Dust.Zea Kostka, RN  Can you please also have him hold losartan and KCl. He should only be taking KCl with his prn Lasix. Follow up BMET 4/30.    Attempted to call patient. [facility] 04/18/2020

## 2020-04-18 NOTE — Telephone Encounter (Signed)
Spoke with patient in office and reviewed results and POC.   [name] and Valor verbalized understanding and had no further questions at this time.   Orders are updated.

## 2020-04-18 NOTE — Telephone Encounter (Signed)
-----   Message from Rise Mu, PA-C sent at 04/16/2020  4:05 PM EDT ----- Potassium is slightly low Renal function is worse Random blood sugar is ok  Recommendations: -Earlier this month, his Lasix was changed to only as needed. We need to ensure this is how he has not been taking this regularly. 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 Please verify how often he is taking Lasix. If he has been taking this regularly, he will need to hold this as previously recommended and only take as needed. Please refer him to nephrology for worsening renal function in the setting of chronic volume loss with diarrhea.

## 2020-04-19 ENCOUNTER — Ambulatory Visit (INDEPENDENT_AMBULATORY_CARE_PROVIDER_SITE_OTHER): Payer: Medicare Other

## 2020-04-19 ENCOUNTER — Other Ambulatory Visit: Payer: Self-pay

## 2020-04-19 DIAGNOSIS — I428 Other cardiomyopathies: Secondary | ICD-10-CM

## 2020-04-21 ENCOUNTER — Telehealth: Payer: Self-pay

## 2020-04-21 NOTE — Telephone Encounter (Signed)
Attempted to call patient. [facility] 04/21/2020

## 2020-04-21 NOTE — Telephone Encounter (Signed)
-----   Message from Rise Mu, PA-C sent at 04/20/2020  9:18 AM EDT ----- Echo showed reduced pump function of 30-35% with a dilated left ventricle, moderately elevated pressure in the right side of the heart as well as a moderately leaky mitral valve and mildly to moderately leaky tricuspid valve.   When compared to prior echo, his pump function is unchanged and the mitral valve is a little more leaky. With his chronic diarrhea, diuresis has been difficult due to tenuous kidney function. Keep planned follow up.

## 2020-04-21 NOTE — Telephone Encounter (Signed)
Referral and notes faxed to [facility] per request from Christell Faith, [place].

## 2020-04-22 NOTE — Progress Notes (Signed)
Cardiology Office Note    Date:  04/28/2020   ID:  William Holland, DOB 07/18/1946, MRN [id number]  PCP:  Patient, No Pcp Per  Cardiologist:  [name], MD  Electrophysiologist:  None   Chief Complaint: Follow up  History of Present Illness:   William Holland is a 74 y.o. male with history of chronic combined systolic and diastolic CHF secondary toNICMdiagnosed in 12/2018, pulmonary hypertension, CKD stage II-III, diverticulitis, chronic diarrhea, anemia, and intellectual slowing who presents for follow-up of his cardiomyopathy.  He was admitted to the hospital in 12/2018 with a 73-monthhistory of watery diarrhea with 4-10 loose stools per day. He was found to have prominent colon distention with CT of the abdomen and pelvis showing free air suggestive of bowel perforation. However, he was hemodynamically stable. He was significantly volume overloaded. Echo showed an EF of 30 to 35% with diffuse hypokinesis, grade 2 diastolic dysfunction, mild mitral vegetation, moderately dilated left atrium, moderately reduced RV systolic function. He was evaluated by surgery regarding his bowel perforation with repeat CT showing a small amount of intraperitoneal air which was stable without contrast extravasation. In this setting, conservative management was recommended. With regards to his cardiomyopathy, he underwent diagnostic cardiac cath in 01/2019 which showed minimal nonobstructive RCA disease and otherwise normal coronary arteries. He did well on medical management though ran out of his Lasix in the summer 2020. When he was evaluated in 09/2019 he was volume overloaded with a ReDs vest reading of 42%. Lasix was resumed with follow-up ReDs vest reading 1 week later remaining elevated at 46%. With this, he was advised to take Lasix 40 mg daily for 5 days then 20 mg daily thereafter. In follow-up in 11/20, his weight was down 25 pounds with electrolytes and renal function noted to be stable. In this  setting, his Lasix was reduced to 10 mg daily given significant weight loss and ongoing daily diarrhea. He was seen in 11/2019 and was feeling relatively well though his weight was up 13 pounds when compared to his visit in mid 10/2019 with increase in bilateral lower extremity swelling. He reported compliance with Lasix and was trying to limit his salt intake. He was not weighing daily at home. He was advised to take Lasix 40 mg daily for 3 days then resume 20 mg daily thereafter.  He was seen in the office in 12/2019 and was doing well.  His weight was down 6 pounds compared to his visit in 11/2019 with significant improvement in lower extremity swelling and abdominal distension.  He was ultimately transitioned to Lasix 20 mg alternating with 10 mg daily given slight elevations in follow up renal function.  He was last seen on 02/17/2020, and was doing well from a cardiac perspective. His weight was down another 6 pounds when compared to his prior visit. Since that visit, his Lasix has been transitioned to prn and his losartan has been discontinued in the setting of worsening renal function as outlined below. Echo on 04/19/2020 showed an unchanged EF of 30-35%, global HK, moderately to severely dilated LV cavity, moderately elevated PASP, moderate MR, and mild to moderate TR.   He comes in today doing reasonably well.  He does note some worsening abdominal fullness that has been progressive over the past several weeks.  He has stable two-pillow orthopnea and denies any dyspnea.  No lower extremity swelling, PND, or early satiety.  No chest pain.  He has not needed any as needed Lasix since this  was discontinued several weeks ago.  He is now taking losartan as this was discontinued following his most recent renal function from last month.  He has not yet seen nephrology.  No worsening of his chronic diarrhea.  He reports his weight has been stable at home.  He is not adding salt to food or eating foods sodium.   He is drinking a little over 2 L of fluid per day.   Labs independently reviewed: 03/2020 - potassium 3.4, BUN 52, SCr 2.70 09/2019-Hgb 10.8 PLT 158 01/2019-albumin 3.7, AST/ALT normal 12/2018-magnesium 2.0, TSH elevated at 6.424, free T4 low at 0.78, free T3 low at 1.3, A1c 5.2  Past Medical History:  Diagnosis Date   Anemia    Bowel perforation (HCC)    Chronic combined systolic (congestive) and diastolic (congestive) heart failure ([place])    a. 12/2018 Echo: EF of 30 to 35%, diffuse hypokinesis, grade 2 diastolic dysfunction, mild mitral regurgitation, moderately dilated left atrium, moderately reduced RV systolic function, trivial pericardial effusion was identified, along with a right pleural effusion   Chronic diarrhea    Diverticulosis, sigmoid 02/27/2018   Colonoscopy done on 02/27/2018 by Dr. Marius Ditch   NICM (nonischemic cardiomyopathy) ([place])    a. 12/2018 Echo: Ef 30-35%, diff HK, Gr2 DD.   Nonobstructive CAD (coronary artery disease)    a. 01/2019 Cath: LM nl, LAD nl, LCX nl, RCA min irregs.   Stuttering during school years     Past Surgical History:  Procedure Laterality Date   APPENDECTOMY     COLONOSCOPY WITH PROPOFOL N/A 02/27/2018   Procedure: COLONOSCOPY WITH PROPOFOL;  Surgeon: Lin Landsman, MD;  Location: [facility] ENDOSCOPY;  Service: Gastroenterology;  Laterality: N/A;   EYE SURGERY     RIGHT/LEFT HEART CATH AND CORONARY ANGIOGRAPHY N/A 02/09/2019   Procedure: RIGHT/LEFT HEART CATH AND CORONARY ANGIOGRAPHY;  Surgeon: Wellington Hampshire, MD;  Location: [facility];  Service: Cardiovascular;  Laterality: N/A;    Current Medications: Current Meds  Medication Sig   acidophilus (RISAQUAD) CAPS capsule Take 1 capsule by mouth daily.   furosemide (LASIX) 20 MG tablet Take 1 tablet (20 mg total) by mouth daily as needed.   levothyroxine (SYNTHROID, LEVOTHROID) 50 MCG tablet Take 1 tablet (50 mcg total) by mouth daily before breakfast.    metoprolol succinate (TOPROL XL) 25 MG 24 hr tablet Take 0.5 tablets (12.5 mg total) by mouth daily.   potassium chloride SA (KLOR-CON) 20 MEQ tablet Take 0.5 tablets (10 mEq total) by mouth daily as needed.    Allergies:   Patient has no known allergies.   Social History   Socioeconomic History   Marital status: Single    Spouse name: Not on file   Number of children: Not on file   Years of education: Not on file   Highest education level: Not on file  Occupational History   Occupation: retired  Tobacco Use   Smoking status: Never Smoker   Smokeless tobacco: Never Used  Substance and Sexual Activity   Alcohol use: No    Alcohol/week: 0.0 standard drinks   Drug use: No   Sexual activity: Not on file    Comment: Not Asked  Other Topics Concern   Not on file  Social History Narrative   Lives at home with his nephew.  Unsteady gait.   Social Determinants of Health   Financial Resource Strain:    Difficulty of Paying Living Expenses:   Food Insecurity:    Worried About  Running Out of Food in the Last Year:    Arboriculturist in the Last Year:   Transportation Needs:    Film/video editor (Medical):    Lack of Transportation (Non-Medical):   Physical Activity:    Days of Exercise per Week:    Minutes of Exercise per Session:   Stress:    Feeling of Stress :   Social Connections:    Frequency of Communication with Friends and Family:    Frequency of Social Gatherings with Friends and Family:    Attends Religious Services:    Active Member of Clubs or Organizations:    Attends Archivist Meetings:    Marital Status:      Family History:  The patient's family history includes Cancer in his brother and father; Uterine cancer in his mother.  ROS:   Review of Systems  Constitutional: Positive for malaise/fatigue. Negative for chills, diaphoresis, fever and weight loss.  HENT: Negative for congestion.   Eyes: Negative for  discharge and redness.  Respiratory: Negative for cough, sputum production, shortness of breath and wheezing.   Cardiovascular: Negative for chest pain, palpitations, orthopnea, claudication, leg swelling and PND.  Gastrointestinal: Positive for diarrhea. Negative for abdominal pain, blood in stool, constipation, heartburn, melena, nausea and vomiting.       Abdominal distention  Musculoskeletal: Negative for falls and myalgias.  Skin: Negative for rash.  Neurological: Positive for weakness. Negative for dizziness, tingling, tremors, sensory change, speech change, focal weakness and loss of consciousness.  Endo/Heme/Allergies: Does not bruise/bleed easily.  Psychiatric/Behavioral: Negative for substance abuse. The patient is not nervous/anxious.   All other systems reviewed and are negative.    EKGs/Labs/Other Studies Reviewed:    Studies reviewed were summarized above. The additional studies were reviewed today:  2D Echo 12/2018: - Left ventricle: The cavity size was at the upper limits of normal. Wall thickness was normal. Systolic function was moderately to severely reduced. The estimated ejection fraction was in the range of 30% to 35%. Diffuse hypokinesis. Regional wall motion abnormalities cannot be excluded. Features are consistent with a pseudonormal left ventricular filling pattern, with concomitant abnormal relaxation and increased filling pressure (grade 2 diastolic dysfunction). Doppler parameters are consistent with high ventricular filling pressure. - Aortic valve: Trileaflet; mildly thickened leaflets. - Mitral valve: Mildly thickened leaflets . There was mild regurgitation. - Left atrium: The atrium was moderately dilated. - Right ventricle: The cavity size was normal. Systolic function was moderately reduced. - Pericardium, extracardiac: A trivial pericardial effusion was identified. There was a right pleural effusion. __________  [facility]  01/2019: 1. Near normal coronary arteries with no obstructive disease. 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  2. Right heart catheterization showed moderately elevated filling pressures, moderate pulmonary hypertension and normal cardiac output.  Recommendations: The patient has nonischemic cardiomyopathy for which I recommend continuing medical therapy. He continues to be volume overloaded and thus I will go ahead and start him on small dose furosemide with potassium. __________  2D echo 04/19/2020: 1. Left ventricular ejection fraction, by estimation, is 30 to 35%. The  left ventricle has moderately decreased function. The left ventricle  demonstrates global hypokinesis. The left ventricular internal cavity size  was moderately to severely dilated.  2. There is moderately elevated pulmonary artery systolic pressure.  3. Moderate mitral valve regurgitation.  4. Tricuspid valve regurgitation is mild to moderate.    EKG:  EKG is ordered today.  The EKG ordered today demonstrates NSR, 82 bpm specific ST-T changes  Recent Labs: 10/12/2019: Hemoglobin 10.8; Platelets 158 04/16/2020: BUN 52; Creatinine, Ser 2.70; Potassium 3.4; Sodium 143  Recent Lipid Panel No results found for: CHOL, TRIG, HDL, CHOLHDL, VLDL, LDLCALC, LDLDIRECT  PHYSICAL EXAM:    VS:  BP 120/60 (BP Location: Right Arm, Patient Position: Sitting, Cuff Size: Normal)    Pulse 82    Ht 5' 6"  (1.676 m)    Wt 150 lb (68 kg)    BMI 24.21 kg/m   BMI: Body mass index is 24.21 kg/m.  Physical Exam  Constitutional: He is oriented to person, place, and time. He appears well-developed and well-nourished.  HENT:  Head: Normocephalic and atraumatic.  Eyes: Right eye exhibits no discharge. Left eye exhibits no discharge.  Neck: JVD present.  JVD elevated to the angle of the mandible  Cardiovascular: Normal rate, regular rhythm, S1 normal, S2 normal and normal heart  sounds. Exam reveals no distant heart sounds, no friction rub, no midsystolic click and no opening snap.  No murmur heard. Pulses:      Posterior tibial pulses are 2+ on the right side and 2+ on the left side.  Pulmonary/Chest: Effort normal and breath sounds normal. No respiratory distress. He has no decreased breath sounds. He has no wheezes. He has no rales. He exhibits no tenderness.  Abdominal: Soft. He exhibits distension. There is no abdominal tenderness.  Musculoskeletal:        General: Edema present.     Cervical back: Normal range of motion.     Comments: Trace bilateral pretibial edema  Neurological: He is alert and oriented to person, place, and time.  Skin: Skin is warm and dry. No cyanosis. Nails show no clubbing.  Psychiatric: He has a normal mood and affect. His speech is normal and behavior is normal. Judgment and thought content normal.    Wt Readings from Last 3 Encounters:  04/28/20 150 lb (68 kg)  02/25/20 144 lb 1.6 oz (65.4 kg)  02/17/20 146 lb 8 oz (66.5 kg)     ASSESSMENT & PLAN:   1. Chronic combined systolic and diastolic CHF/NICM/pulmonary hypertension/anasarca: He has noted worsening abdominal distention over the past several weeks following the transition of standing Lasix to as needed.  Since this transition he has not taken any Lasix.  He will have a CMP and CBC drawn in the [facility] this morning.  Outpatient diuresis has been difficult given his chronic diarrhea and worsening renal function.  Based on these labs he may require ED evaluation with possible paracentesis as well.  He may benefit from a cardiac MRI though with his tenuous renal function we are currently unable to perform this.  Due to worsening kidney disease we have had to discontinue losartan and transition his Lasix to as needed dosing.  We have been and able to him on further GDMT including Entresto or spironolactone secondary to relative hypotension and underlying CKD.  He remains on  Toprol-XL 12.5 mg daily.  His weight is up 4 pounds today when compared to his most recent visit with [place] on 02/17/2020 with evidence of volume overload on exam.  2. Acute on CKD stage III: More recently, he has had worsening renal function with most recent labs showing a BUN/SCr of 52/2.70, despite his Lasix having already been transitioned to as needed dosing.  His losartan was discontinued at that time.  He has been referred to nephrology.  Check stat CMP today.  3. Chronic diarrhea: Persistent and stable.  Suspect this is playing a significant role in his worsening renal function with dehydration.  Disposition: F/u with Dr. Fletcher Anon in 2 weeks.   Medication Adjustments/Labs and Tests Ordered: Current medicines are reviewed at length with the patient today.  Concerns regarding medicines are outlined above. Medication changes, Labs and Tests ordered today are summarized above and listed in the Patient Instructions accessible in Encounters.   Signed, Christell Faith, PA-C 04/28/2020 10:38 AM     [facility] [address] [place], [place] [address] [phone]

## 2020-04-25 ENCOUNTER — Telehealth: Payer: Self-pay | Admitting: Cardiovascular Disease

## 2020-04-25 NOTE — Telephone Encounter (Signed)
Olivia Mackie with [employer] [employer] states she is requesting for our office to contact the patient and advise for him to stop taking Losartan medication in order to schedule the patient. If Dr. Fletcher Anon has any questions she states a call may be returned to [phone] (ext#: [phone].

## 2020-04-25 NOTE — Telephone Encounter (Addendum)
Spoke with Olivia Mackie at [employer]. They are scheduling the patient to be seen, but wanted to make sure that the patients Losartan was stopped prior to the appt. Advised Olivia Mackie that Losartan is not on the patients current medication list and was d/c on 04/18/20 by our PA Christell Faith.  Olivia Mackie verbalized understanding and voiced appreciation for the call back. No further action required.

## 2020-04-28 ENCOUNTER — Telehealth: Payer: Self-pay

## 2020-04-28 ENCOUNTER — Encounter: Payer: Self-pay | Admitting: Physician Assistant

## 2020-04-28 ENCOUNTER — Other Ambulatory Visit: Payer: Self-pay

## 2020-04-28 ENCOUNTER — Ambulatory Visit (INDEPENDENT_AMBULATORY_CARE_PROVIDER_SITE_OTHER): Payer: Medicare Other | Admitting: Physician Assistant

## 2020-04-28 ENCOUNTER — Other Ambulatory Visit
Admission: RE | Admit: 2020-04-28 | Discharge: 2020-04-28 | Disposition: A | Discharge status: Home / Self Care | Payer: Medicare Other | Source: Ambulatory Visit | Attending: Physician Assistant | Admitting: Physician Assistant

## 2020-04-28 VITALS — BP 120/60 | HR 82 | Ht 66.0 in | Wt 150.0 lb

## 2020-04-28 DIAGNOSIS — R601 Generalized edema: Secondary | ICD-10-CM | POA: Diagnosis present

## 2020-04-28 DIAGNOSIS — N179 Acute kidney failure, unspecified: Secondary | ICD-10-CM

## 2020-04-28 DIAGNOSIS — I2721 Secondary pulmonary arterial hypertension: Secondary | ICD-10-CM

## 2020-04-28 DIAGNOSIS — I5042 Chronic combined systolic (congestive) and diastolic (congestive) heart failure: Secondary | ICD-10-CM

## 2020-04-28 DIAGNOSIS — I428 Other cardiomyopathies: Secondary | ICD-10-CM | POA: Diagnosis not present

## 2020-04-28 DIAGNOSIS — K529 Noninfective gastroenteritis and colitis, unspecified: Secondary | ICD-10-CM

## 2020-04-28 DIAGNOSIS — N183 Chronic kidney disease, stage 3 unspecified: Secondary | ICD-10-CM

## 2020-04-28 DIAGNOSIS — I5022 Chronic systolic (congestive) heart failure: Secondary | ICD-10-CM

## 2020-04-28 LAB — COMPREHENSIVE METABOLIC PANEL
ALT: 17 U/L (ref 0–44)
AST: 16 U/L (ref 15–41)
Albumin: 3.2 g/dL — ABNORMAL LOW (ref 3.5–5.0)
Alkaline Phosphatase: 73 U/L (ref 38–126)
Anion gap: 6 (ref 5–15)
BUN: 41 mg/dL — ABNORMAL HIGH (ref 8–23)
CO2: 26 mmol/L (ref 22–32)
Calcium: 8.7 mg/dL — ABNORMAL LOW (ref 8.9–10.3)
Chloride: 113 mmol/L — ABNORMAL HIGH (ref 98–111)
Creatinine, Ser: 2.49 mg/dL — ABNORMAL HIGH (ref 0.61–1.24)
GFR calc Af Amer: 29 mL/min — ABNORMAL LOW (ref >60)
GFR calc non Af Amer: 25 mL/min — ABNORMAL LOW (ref >60)
Glucose, Bld: 105 mg/dL — ABNORMAL HIGH (ref 70–99)
Potassium: 2.8 mmol/L — ABNORMAL LOW (ref 3.5–5.1)
Sodium: 145 mmol/L (ref 135–145)
Total Bilirubin: 0.7 mg/dL (ref 0.3–1.2)
Total Protein: 7.6 g/dL (ref 6.5–8.1)

## 2020-04-28 LAB — CBC
HCT: 30.6 % — ABNORMAL LOW (ref 39.0–52.0)
Hemoglobin: 9.7 g/dL — ABNORMAL LOW (ref 13.0–17.0)
MCH: 28 pg (ref 26.0–34.0)
MCHC: 31.7 g/dL (ref 30.0–36.0)
MCV: 88.4 fL (ref 80.0–100.0)
Platelets: 239 10*3/uL (ref 150–400)
RBC: 3.46 MIL/uL — ABNORMAL LOW (ref 4.22–5.81)
RDW: 16 % — ABNORMAL HIGH (ref 11.5–15.5)
WBC: 10.4 10*3/uL (ref 4.0–10.5)
nRBC: 0 % (ref 0.0–0.2)

## 2020-04-28 NOTE — Telephone Encounter (Signed)
Discussed preliminary results with provider, Christell Faith, PA who advised pt be seen in the ED for low K and downtrending hgb.   No answer x 2, left message.   [facility] could locate them.

## 2020-04-28 NOTE — Telephone Encounter (Signed)
Call attempted. Left message.

## 2020-04-28 NOTE — Telephone Encounter (Signed)
Results discussed with patient by provider at [facility] today.   Encounter completed at this time.

## 2020-04-28 NOTE — Progress Notes (Signed)
Spoke with patient's brother at the patient request.  Discussed labs in detail.  Patient continues to feel very well.  We will have him take 40 mEq of KCl this evening.  He will continue to hold Lasix.  We will have him come in for a stat BMP and CBC on 5/10.  He will keep his follow-up with [place] on 5/20.  We will follow-up with his nephrology referral.

## 2020-04-28 NOTE — Patient Instructions (Signed)
Medication Instructions:  Your physician recommends that you continue on your current medications as directed. Please refer to the Current Medication list given to you today.  *If you need a refill on your cardiac medications before your next appointment, please call your pharmacy*   Lab Work: Your physician recommends that you return for lab work TODAY at the [facility] mall. (CMET, CBC) PLEASE WAIT TO LEAVE UNTIL WE HAVE CALLED YOU WITH RESULTS!!!  No appt is needed. Hours are M-F 7AM- 6 PM.  If you have labs (blood work) drawn today and your tests are completely normal, you will receive your results only by: Marland Kitchen MyChart Message (if you have MyChart) OR . A paper copy in the mail If you have any lab test that is abnormal or we need to change your treatment, we will call you to review the results.   Testing/Procedures: None ordered   . Follow-Up: At [facility], you and your health needs are our priority.  As part of our continuing mission to provide you with exceptional heart care, we have created designated Provider Care Teams.  These Care Teams include your primary Cardiologist (physician) and Advanced Practice Providers (APPs -  Physician Assistants and Nurse Practitioners) who all work together to provide you with the care you need, when you need it.  We recommend signing up for the patient portal called "MyChart".  Sign up information is provided on this After Visit Summary.  MyChart is used to connect with patients for Virtual Visits (Telemedicine).  Patients are able to view lab/test results, encounter notes, upcoming appointments, etc.  Non-urgent messages can be sent to your provider as well.   To learn more about what you can do with MyChart, go to [email or web address].    Your next appointment:   2 week(s)  The format for your next appointment:   In Person  Provider:   PLEASE SEE [name], MD

## 2020-04-29 NOTE — Telephone Encounter (Signed)
Call to brother to discuss labs and POC. It appears that [employer], PA was able to reach [place] last night to discuss POC.   He gave Jenny Reichmann 2 K cl tablets last night and is aware of [facility] on Monday.   Per verbal from Christell Faith, [place] pt should take k Cl 1 tablet daily until repeat labs are taken.   Orders placed.

## 2020-05-02 ENCOUNTER — Telehealth: Payer: Self-pay

## 2020-05-02 ENCOUNTER — Other Ambulatory Visit
Admission: RE | Admit: 2020-05-02 | Discharge: 2020-05-02 | Disposition: A | Discharge status: Home / Self Care | Payer: Medicare Other | Source: Ambulatory Visit | Attending: Physician Assistant | Admitting: Physician Assistant

## 2020-05-02 DIAGNOSIS — I5022 Chronic systolic (congestive) heart failure: Secondary | ICD-10-CM | POA: Diagnosis present

## 2020-05-02 LAB — BASIC METABOLIC PANEL
Anion gap: 9 (ref 5–15)
BUN: 43 mg/dL — ABNORMAL HIGH (ref 8–23)
CO2: 26 mmol/L (ref 22–32)
Calcium: 8.5 mg/dL — ABNORMAL LOW (ref 8.9–10.3)
Chloride: 110 mmol/L (ref 98–111)
Creatinine, Ser: 2.54 mg/dL — ABNORMAL HIGH (ref 0.61–1.24)
GFR calc Af Amer: 28 mL/min — ABNORMAL LOW (ref >60)
GFR calc non Af Amer: 24 mL/min — ABNORMAL LOW (ref >60)
Glucose, Bld: 103 mg/dL — ABNORMAL HIGH (ref 70–99)
Potassium: 2.9 mmol/L — ABNORMAL LOW (ref 3.5–5.1)
Sodium: 145 mmol/L (ref 135–145)

## 2020-05-02 LAB — CBC
HCT: 29.8 % — ABNORMAL LOW (ref 39.0–52.0)
Hemoglobin: 9.3 g/dL — ABNORMAL LOW (ref 13.0–17.0)
MCH: 28.3 pg (ref 26.0–34.0)
MCHC: 31.2 g/dL (ref 30.0–36.0)
MCV: 90.6 fL (ref 80.0–100.0)
Platelets: 248 10*3/uL (ref 150–400)
RBC: 3.29 MIL/uL — ABNORMAL LOW (ref 4.22–5.81)
RDW: 15.7 % — ABNORMAL HIGH (ref 11.5–15.5)
WBC: 10.8 10*3/uL — ABNORMAL HIGH (ref 4.0–10.5)
nRBC: 0 % (ref 0.0–0.2)

## 2020-05-02 LAB — MAGNESIUM: Magnesium: 2.5 mg/dL — ABNORMAL HIGH (ref 1.7–2.4)

## 2020-05-02 MED ORDER — POTASSIUM CHLORIDE CRYS ER 20 MEQ PO TBCR: 10.0000 meq | EXTENDED_RELEASE_TABLET | Freq: Every day | ORAL | 3 refills | Status: DC

## 2020-05-02 NOTE — Addendum Note (Signed)
Addended by: Verlon Au on: 05/02/2020 04:06 PM   Modules accepted: Orders

## 2020-05-02 NOTE — Addendum Note (Signed)
Addended by: Verlon Au on: 05/02/2020 04:02 PM   Modules accepted: Orders

## 2020-05-02 NOTE — Telephone Encounter (Signed)
Patient SIL is calling to let us know [facility] scheduled next opening 05/13/20.  Please call if needing to discuss .

## 2020-05-02 NOTE — Telephone Encounter (Signed)
Rise Mu, PA-C  Alroy Dust.Esiquio Boesen, RN  Renal function remains elevated, though stable, from his baseline, off all diuretics and ARB. Potassium remains low. Random glucose is ok. Blood count is low, though stable. WBC is mildly elevated.   Recommendations:  -He continues to have worsening renal function, despite being off nephrotoxic medications  -His persistent hypokalemia is driven by his diarrhea, which the patient and his brother have indicated is in the setting of severe sigmoid diverticulosis  -Please add on a magnesium to blood work drawn today  -I would like for him to schedule an appointment with his GI doctor for further evaluation of his diarrhea as this needs to be addressed  -Please check on his nephrology referral  -He needs to follow up with his PCP this week for medical renal disease as there is not an underlying cardiac etiology of this at this time given he is off cardiac medications  -Please have him take KCl 80 mEq of KCl x 1 followed by 10 mEq daily thereafter

## 2020-05-02 NOTE — Telephone Encounter (Signed)
preliminary labs reviewed with Christell Faith, PA. Ryan asked that I call [facility] to see where they are with referral.   Spoke with Lattie Haw who reported that had attempted to reach patient on 5/5 and left vm with no call back.   I called and spoke to [name] and his wife Ardith. Provided number to [facility] to try to get appt this week.   They agreed and will let me know how it goes.

## 2020-05-02 NOTE — Telephone Encounter (Signed)
[facility] will [facility] Wednesday for repeat labs.

## 2020-05-02 NOTE — Telephone Encounter (Signed)
Returned call to family of William Holland with suggestions from Christell Faith, PA>   They reported that [facility] could not offer appt before their appt on 5/21 @8  am.   Called [facility] for add on mag.   They will contact GI to follow up on diarrhea.  Per family pt does not have PCP. I gave them number for [facility].   Agreeable to POC with additional K today and ongoing.

## 2020-05-04 ENCOUNTER — Other Ambulatory Visit
Admission: RE | Admit: 2020-05-04 | Discharge: 2020-05-04 | Disposition: A | Discharge status: Home / Self Care | Payer: Medicare Other | Source: Ambulatory Visit | Attending: Physician Assistant | Admitting: Physician Assistant

## 2020-05-04 DIAGNOSIS — I5022 Chronic systolic (congestive) heart failure: Secondary | ICD-10-CM | POA: Diagnosis present

## 2020-05-04 LAB — BASIC METABOLIC PANEL
Anion gap: 9 (ref 5–15)
BUN: 44 mg/dL — ABNORMAL HIGH (ref 8–23)
CO2: 26 mmol/L (ref 22–32)
Calcium: 8.7 mg/dL — ABNORMAL LOW (ref 8.9–10.3)
Chloride: 109 mmol/L (ref 98–111)
Creatinine, Ser: 2.57 mg/dL — ABNORMAL HIGH (ref 0.61–1.24)
GFR calc Af Amer: 28 mL/min — ABNORMAL LOW (ref >60)
GFR calc non Af Amer: 24 mL/min — ABNORMAL LOW (ref >60)
Glucose, Bld: 97 mg/dL (ref 70–99)
Potassium: 2.8 mmol/L — ABNORMAL LOW (ref 3.5–5.1)
Sodium: 144 mmol/L (ref 135–145)

## 2020-05-04 LAB — MAGNESIUM: Magnesium: 2.3 mg/dL (ref 1.7–2.4)

## 2020-05-04 MED ORDER — POTASSIUM CHLORIDE CRYS ER 20 MEQ PO TBCR: 20.0000 meq | EXTENDED_RELEASE_TABLET | Freq: Every day | ORAL | 3 refills | Status: DC

## 2020-05-04 NOTE — Telephone Encounter (Signed)
-----   Message from Rise Mu, PA-C sent at 05/04/2020  9:15 AM EDT ----- No significant changes in his renal function or potassium. Magnesium is improved and remains at baseline.   Recommendations: -Needs to establish with a PCP for medical renal disease -Follow up with GI for ongoing diarrhea  -Continue to hold Lasix and losartan -Keep appointment with nephrology  -Please have him take KCl 80 mEq daily x 2 days followed by 20 mEq daily thereafter -Repeat BMET 1 week which will help gauge therapy at his appointment on 5/20 as well

## 2020-05-04 NOTE — Telephone Encounter (Signed)
Call to patient to review labs.    Pt verbalized understanding and has no further questions at this time.    Advised pt to call for any further questions or concerns.  Orders placed as advised.

## 2020-05-11 ENCOUNTER — Other Ambulatory Visit
Admission: RE | Admit: 2020-05-11 | Discharge: 2020-05-11 | Disposition: A | Discharge status: Home / Self Care | Payer: Medicare Other | Source: Ambulatory Visit | Attending: Physician Assistant | Admitting: Physician Assistant

## 2020-05-11 DIAGNOSIS — I5022 Chronic systolic (congestive) heart failure: Secondary | ICD-10-CM | POA: Diagnosis present

## 2020-05-11 LAB — BASIC METABOLIC PANEL
Anion gap: 9 (ref 5–15)
BUN: 36 mg/dL — ABNORMAL HIGH (ref 8–23)
CO2: 25 mmol/L (ref 22–32)
Calcium: 9 mg/dL (ref 8.9–10.3)
Chloride: 108 mmol/L (ref 98–111)
Creatinine, Ser: 2.55 mg/dL — ABNORMAL HIGH (ref 0.61–1.24)
GFR calc Af Amer: 28 mL/min — ABNORMAL LOW (ref >60)
GFR calc non Af Amer: 24 mL/min — ABNORMAL LOW (ref >60)
Glucose, Bld: 111 mg/dL — ABNORMAL HIGH (ref 70–99)
Potassium: 3.5 mmol/L (ref 3.5–5.1)
Sodium: 142 mmol/L (ref 135–145)

## 2020-05-12 ENCOUNTER — Ambulatory Visit: Payer: Medicare Other | Admitting: Cardiovascular Disease

## 2020-05-15 NOTE — Progress Notes (Signed)
Cardiology Office Note    Date:  05/18/2020   ID:  Chloe Baig, DOB 01/12/1946, MRN [id number]  PCP:  Patient, No Pcp Per  Cardiologist:  [name], MD  Electrophysiologist:  None   Chief Complaint: Follow up  History of Present Illness:   Nissan Frazzini is a 74 y.o. male with history of chronic combined systolic and diastolic CHF secondary toNICMdiagnosed in 12/2018, pulmonary hypertension, CKD stage II-III, diverticulitis, chronic diarrhea, anemia, and intellectual slowing who presents for follow-up ofhis cardiomyopathy.  He was admitted to the hospital in 12/2018 with a 61-monthhistory of watery diarrhea with 4-10 loose stools per day. He was found to have prominent colon distention with CT of the abdomen and pelvis showing free air suggestive of bowel perforation. However, he was hemodynamically stable. He was significantly volume overloaded. Echo showed an EF of 30 to 35% with diffuse hypokinesis, grade 2 diastolic dysfunction, mild mitral vegetation, moderately dilated left atrium, moderately reduced RV systolic function. He was evaluated by surgery regarding his bowel perforation with repeat CT showing a small amount of intraperitoneal air which was stable without contrast extravasation. In this setting, conservative management was recommended. With regards to his cardiomyopathy, he underwent diagnostic cardiac cath in 01/2019 which showed minimal nonobstructive RCA disease and otherwise normal coronary arteries. He did well on medical management though ran out of his Lasix in the summer 2020. When he was evaluated in 09/2019 he was volume overloaded with a ReDs vest reading of 42%. Lasix was resumed with follow-up ReDs vest reading 1 week later remaining elevated at 46%. With this, he was advised to take Lasix 40 mg daily for 5 days then 20 mg daily thereafter. In follow-up in 11/20, his weight was down 25 pounds with electrolytes and renal function noted to be stable. In this  setting, his Lasix was reduced to 10 mg daily given significant weight loss and ongoing daily diarrhea. He was seen in 11/2019 and was feeling relatively well though his weight was up 13 pounds when compared to his visit in mid 10/2019 with increase in bilateral lower extremity swelling. He reported compliance with Lasix and was trying to limit his salt intake. He was not weighing daily at home. He was advised to take Lasix 40 mg daily for 3 days then resume 20 mg daily thereafter. He was seen in the office in 12/2019 and was doing well. His weight was down 6 pounds compared to his visit in 11/2019 with significant improvement in lower extremity swelling and abdominal distension. He was ultimately transitioned to Lasix 20 mg alternating with 10 mg daily given slight elevations in follow up renal function.  Follow up echo in 03/2020, to evaluate his EF on maximally tolerated GDMT, showed an unchanged LVSF with an EF of 30-35%, global HK, moderately to severely dilated LV cavity, moderately elevated PASP, moderate MR, and mild to moderate TR.   In the setting of worsening renal function, his Lasix and losartan have ultimately been discontinued and he has been referred to nephrology.  He has also been advised to follow up with GI in the setting of his chronic diarrhea.  He was last seen in the office on 04/28/2020, with a weight that was up 4 pounds when compared to his visit in 01/2020.  Diuresis has been limited in the setting of his chronic volume loss with diarrhea and worsening renal function.   He comes in today and is doing reasonably well.  Since he was last seen he  did see nephrology this past week and reports they changed one of his medications, possibly his diuretic though they are uncertain.  He does not have his medications with him for review at this time.  He denies any chest pain, dyspnea, dizziness, presyncope, syncope, or lower extremity swelling.  He has stable 3 pillow orthopnea.  His  abdominal distention is unchanged.  He is drinking less than 2 L of fluid per day.  He is not adding salt to his food.  Weight has remained stable at home.  When compared to his last office visit his weight is down 1 pound.  He continues to have unchanged chronic watery diarrhea.  He is scheduled for renal ultrasound next week.   Labs independently reviewed: 04/2020 - potassium 3.5 (improved from prior 2.8), BUN 36, SCr 2.55 09/2019-Hgb 10.8 PLT 158 01/2019-albumin 3.7, AST/ALT normal 12/2018-magnesium 2.0, TSH elevated at 6.424, free T4 low at 0.78, free T3 low at 1.3, A1c 5.2   Past Medical History:  Diagnosis Date  . Anemia   . Bowel perforation ([place])   . Chronic combined systolic (congestive) and diastolic (congestive) heart failure (HCC)    a. 12/2018 Echo: EF of 30 to 35%, diffuse hypokinesis, grade 2 diastolic dysfunction, mild mitral regurgitation, moderately dilated left atrium, moderately reduced RV systolic function, trivial pericardial effusion was identified, along with a right pleural effusion  . Chronic diarrhea   . Diverticulosis, sigmoid 02/27/2018   Colonoscopy done on 02/27/2018 by Dr. Marius Ditch  . NICM (nonischemic cardiomyopathy) ([place])    a. 12/2018 Echo: Ef 30-35%, diff HK, Gr2 DD.  Marland Kitchen Nonobstructive CAD (coronary artery disease)    a. 01/2019 Cath: LM nl, LAD nl, LCX nl, RCA min irregs.  . Stuttering during school years     Past Surgical History:  Procedure Laterality Date  . APPENDECTOMY    . COLONOSCOPY WITH PROPOFOL N/A 02/27/2018   Procedure: COLONOSCOPY WITH PROPOFOL;  Surgeon: Lin Landsman, MD;  Location: [facility] ENDOSCOPY;  Service: Gastroenterology;  Laterality: N/A;  . EYE SURGERY    . RIGHT/LEFT HEART CATH AND CORONARY ANGIOGRAPHY N/A 02/09/2019   Procedure: RIGHT/LEFT HEART CATH AND CORONARY ANGIOGRAPHY;  Surgeon: Wellington Hampshire, MD;  Location: [facility];  Service: Cardiovascular;  Laterality: N/A;    Current Medications: No outpatient  medications have been marked as taking for the 05/18/20 encounter (Office Visit) with Rise Mu, PA-C.    Allergies:   Patient has no known allergies.   Social History   Socioeconomic History  . Marital status: Single    Spouse name: Not on file  . Number of children: Not on file  . Years of education: Not on file  . Highest education level: Not on file  Occupational History  . Occupation: retired  Tobacco Use  . Smoking status: Never Smoker  . Smokeless tobacco: Never Used  Substance and Sexual Activity  . Alcohol use: No    Alcohol/week: 0.0 standard drinks  . Drug use: No  . Sexual activity: Not on file    Comment: Not Asked  Other Topics Concern  . Not on file  Social History Narrative   Lives at home with his nephew.  Unsteady gait.   Social Determinants of Health   Financial Resource Strain:   . Difficulty of Paying Living Expenses:   Food Insecurity:   . Worried About Charity fundraiser in the Last Year:   . [place] in the Last Year:  Transportation Needs:   . Film/video editor (Medical):   Marland Kitchen Lack of Transportation (Non-Medical):   Physical Activity:   . Days of Exercise per Week:   . Minutes of Exercise per Session:   Stress:   . Feeling of Stress :   Social Connections:   . Frequency of Communication with Friends and Family:   . Frequency of Social Gatherings with Friends and Family:   . Attends Religious Services:   . Active Member of Clubs or Organizations:   . Attends Archivist Meetings:   Marland Kitchen Marital Status:      Family History:  The patient's family history includes Cancer in his brother and father; Uterine cancer in his mother.  ROS:   Review of Systems  Constitutional: Positive for malaise/fatigue. Negative for chills, diaphoresis, fever and weight loss.  HENT: Negative for congestion.   Eyes: Negative for discharge and redness.  Respiratory: Negative for cough, hemoptysis, sputum production, shortness of breath  and wheezing.   Cardiovascular: Negative for chest pain, palpitations, orthopnea, claudication, leg swelling and PND.  Gastrointestinal: Positive for diarrhea. Negative for abdominal pain, blood in stool, constipation, heartburn, melena, nausea and vomiting.       Abdominal distention  Genitourinary: Negative for hematuria.  Musculoskeletal: Negative for falls and myalgias.  Skin: Negative for rash.  Neurological: Positive for weakness. Negative for dizziness, tingling, tremors, sensory change, speech change, focal weakness and loss of consciousness.  Endo/Heme/Allergies: Does not bruise/bleed easily.  Psychiatric/Behavioral: Negative for substance abuse. The patient is not nervous/anxious.   All other systems reviewed and are negative.    EKGs/Labs/Other Studies Reviewed:    Studies reviewed were summarized above. The additional studies were reviewed today: As above.  EKG:  EKG is ordered today.  The EKG ordered today demonstrates NSR, 81 bpm, nonspecific lateral ST-T change  Recent Labs: 04/28/2020: ALT 17 05/02/2020: Hemoglobin 9.3; Platelets 248 05/04/2020: Magnesium 2.3 05/11/2020: BUN 36; Creatinine, Ser 2.55; Potassium 3.5; Sodium 142  Recent Lipid Panel No results found for: CHOL, TRIG, HDL, CHOLHDL, VLDL, LDLCALC, LDLDIRECT  PHYSICAL EXAM:    VS:  BP 102/66 (BP Location: Left Arm, Patient Position: Sitting, Cuff Size: Normal)   Pulse 81   Ht 5' 6"  (1.676 m)   Wt 149 lb 6 oz (67.8 kg)   SpO2 98%   BMI 24.11 kg/m   BMI: Body mass index is 24.11 kg/m.  Physical Exam  Constitutional: He is oriented to person, place, and time. He appears well-developed and well-nourished.  HENT:  Head: Normocephalic and atraumatic.  Eyes: Right eye exhibits no discharge. Left eye exhibits no discharge.  Neck: JVD present.  Cardiovascular: Normal rate, regular rhythm, S1 normal, S2 normal and normal heart sounds. Exam reveals no distant heart sounds, no friction rub, no midsystolic click  and no opening snap.  No murmur heard. Pulses:      Posterior tibial pulses are 2+ on the right side and 2+ on the left side.  Pulmonary/Chest: Effort normal and breath sounds normal. No respiratory distress. He has no decreased breath sounds. He has no wheezes. He has no rales. He exhibits no tenderness.  Abdominal: Soft. He exhibits distension. There is no abdominal tenderness.  Musculoskeletal:        General: Edema present.     Cervical back: Normal range of motion.     Comments: Trace bilateral pretibial edema  Neurological: He is alert and oriented to person, place, and time.  Skin: Skin is warm and dry. No  cyanosis. Nails show no clubbing.  Psychiatric: He has a normal mood and affect. His speech is normal and behavior is normal. Judgment and thought content normal.    Wt Readings from Last 3 Encounters:  05/18/20 149 lb 6 oz (67.8 kg)  04/28/20 150 lb (68 kg)  02/25/20 144 lb 1.6 oz (65.4 kg)     ASSESSMENT & PLAN:   1. Chronic combined systolic and diastolic CHF/NICM/pulmonary hypertension/anasarca: Most recent liver function from earlier this month normal with mild hypoalbuminemia.  The patient and his brother indicate his diuretic was recently changed by nephrology, possibly from furosemide to torsemide.  Agree with this change, if this in fact was the case.  They will contact our office to let us know medication and dosing.  Unfortunately, in the setting of relative hypotension and worsening renal function GDMT has had to be the escalated.  Hopefully with further management of his renal disease and as BP allows we can continue to optimize his GDMT.  For now, he remains off ACE inhibitor/ARB/Entresto/MRA secondary to relative hypotension and underlying kidney disease.  Continue Toprol-XL.  Consider referral to EP.  2. CKD stage III: Established with nephrology last week.  Renal ultrasound pending.  He has an appointment with him next week for follow-up.  They indicate labs were  checked less than 1 week ago.  3. Chronic diarrhea: Longstanding issue.  Patient's brother indicates the patient must have diarrhea secondary to reported colonic calcification from prior injury.  Details of this are unclear.  Recommend he follow-up with GI.  4. Hypokalemia: Likely in the setting of chronic diarrhea.  Improving on last check.  Disposition: F/u with Dr. Fletcher Anon in 4 weeks.   Medication Adjustments/Labs and Tests Ordered: Current medicines are reviewed at length with the patient today.  Concerns regarding medicines are outlined above. Medication changes, Labs and Tests ordered today are summarized above and listed in the Patient Instructions accessible in Encounters.   Signed, Christell Faith, PA-C 05/18/2020 11:25 AM     [facility] [address] [place], [place] [address] [phone]

## 2020-05-17 ENCOUNTER — Other Ambulatory Visit: Payer: Self-pay | Admitting: Nephrology

## 2020-05-17 DIAGNOSIS — N184 Chronic kidney disease, stage 4 (severe): Secondary | ICD-10-CM

## 2020-05-18 ENCOUNTER — Ambulatory Visit (INDEPENDENT_AMBULATORY_CARE_PROVIDER_SITE_OTHER): Payer: Medicare Other | Admitting: Physician Assistant

## 2020-05-18 ENCOUNTER — Other Ambulatory Visit: Payer: Self-pay

## 2020-05-18 ENCOUNTER — Encounter: Payer: Self-pay | Admitting: Physician Assistant

## 2020-05-18 VITALS — BP 102/66 | HR 81 | Ht 66.0 in | Wt 149.4 lb

## 2020-05-18 DIAGNOSIS — I428 Other cardiomyopathies: Secondary | ICD-10-CM | POA: Diagnosis not present

## 2020-05-18 DIAGNOSIS — I5042 Chronic combined systolic (congestive) and diastolic (congestive) heart failure: Secondary | ICD-10-CM

## 2020-05-18 DIAGNOSIS — I2721 Secondary pulmonary arterial hypertension: Secondary | ICD-10-CM

## 2020-05-18 DIAGNOSIS — R601 Generalized edema: Secondary | ICD-10-CM

## 2020-05-18 DIAGNOSIS — E876 Hypokalemia: Secondary | ICD-10-CM

## 2020-05-18 DIAGNOSIS — K529 Noninfective gastroenteritis and colitis, unspecified: Secondary | ICD-10-CM

## 2020-05-18 DIAGNOSIS — N183 Chronic kidney disease, stage 3 unspecified: Secondary | ICD-10-CM

## 2020-05-18 NOTE — Patient Instructions (Signed)
Medication Instructions:  Your physician recommends that you continue on your current medications as directed. Please refer to the Current Medication list given to you today.  *If you need a refill on your cardiac medications before your next appointment, please call your pharmacy*   Lab Work: None ordered  If you have labs (blood work) drawn today and your tests are completely normal, you will receive your results only by: Marland Kitchen MyChart Message (if you have MyChart) OR . A paper copy in the mail If you have any lab test that is abnormal or we need to change your treatment, we will call you to review the results.   Testing/Procedures: None ordered    Follow-Up: At [facility], you and your health needs are our priority.  As part of our continuing mission to provide you with exceptional heart care, we have created designated Provider Care Teams.  These Care Teams include your primary Cardiologist (physician) and Advanced Practice Providers (APPs -  Physician Assistants and Nurse Practitioners) who all work together to provide you with the care you need, when you need it.  We recommend signing up for the patient portal called "MyChart".  Sign up information is provided on this After Visit Summary.  MyChart is used to connect with patients for Virtual Visits (Telemedicine).  Patients are able to view lab/test results, encounter notes, upcoming appointments, etc.  Non-urgent messages can be sent to your provider as well.   To learn more about what you can do with MyChart, go to [email or web address].    Your next appointment:   1 month(s)  The format for your next appointment:   In Person  Provider:    You may see [name], MD or Christell Faith, PA-C  Other Instructions Don't forget to call us with update about torsemide

## 2020-05-26 ENCOUNTER — Ambulatory Visit
Admission: RE | Admit: 2020-05-26 | Discharge: 2020-05-26 | Disposition: A | Discharge status: Home / Self Care | Payer: Medicare Other | Source: Ambulatory Visit | Attending: Nephrology | Admitting: Nephrology

## 2020-05-26 DIAGNOSIS — N184 Chronic kidney disease, stage 4 (severe): Secondary | ICD-10-CM

## 2020-05-27 ENCOUNTER — Telehealth: Payer: Self-pay

## 2020-05-27 DIAGNOSIS — N1330 Unspecified hydronephrosis: Secondary | ICD-10-CM

## 2020-05-27 NOTE — Telephone Encounter (Signed)
Incoming call from [facility] who state that they had an u/s ordered on the pt which showed bilateral hydronephrosis they would like the pt to be scheduled next week to follow up with you in regards to this. I will call pt to have him scheduled do you want any additional labs or imaging prior to seeing the patient? Copy of u/s under imaging tab. Please advise.

## 2020-05-31 NOTE — Telephone Encounter (Signed)
Order placed, pt called and scheduled.

## 2020-05-31 NOTE — Telephone Encounter (Signed)
Thanks [place].  Lets have him get a CT stone protocol prior to the visit with me, thanks  Nickolas Madrid, MD 05/31/2020

## 2020-06-09 ENCOUNTER — Other Ambulatory Visit: Payer: Self-pay

## 2020-06-09 ENCOUNTER — Ambulatory Visit: Payer: Medicare Other | Admitting: Urology

## 2020-06-14 ENCOUNTER — Ambulatory Visit
Admission: RE | Admit: 2020-06-14 | Discharge: 2020-06-14 | Disposition: A | Discharge status: Home / Self Care | Payer: Medicare Other | Source: Ambulatory Visit | Attending: Urology | Admitting: Urology

## 2020-06-14 ENCOUNTER — Other Ambulatory Visit: Payer: Self-pay

## 2020-06-14 DIAGNOSIS — N1330 Unspecified hydronephrosis: Secondary | ICD-10-CM | POA: Diagnosis present

## 2020-06-20 ENCOUNTER — Other Ambulatory Visit: Payer: Self-pay | Admitting: *Deleted

## 2020-06-20 DIAGNOSIS — N1330 Unspecified hydronephrosis: Secondary | ICD-10-CM

## 2020-06-21 ENCOUNTER — Other Ambulatory Visit
Admission: RE | Admit: 2020-06-21 | Discharge: 2020-06-21 | Disposition: A | Discharge status: Home / Self Care | Payer: Medicare Other | Attending: Urology | Admitting: Urology

## 2020-06-21 ENCOUNTER — Other Ambulatory Visit: Payer: Self-pay

## 2020-06-21 ENCOUNTER — Encounter: Payer: Self-pay | Admitting: Urology

## 2020-06-21 ENCOUNTER — Ambulatory Visit: Payer: Medicare Other | Admitting: Urology

## 2020-06-21 VITALS — BP 110/69 | HR 89 | Ht 66.0 in | Wt 146.0 lb

## 2020-06-21 DIAGNOSIS — N3000 Acute cystitis without hematuria: Secondary | ICD-10-CM

## 2020-06-21 DIAGNOSIS — N1330 Unspecified hydronephrosis: Secondary | ICD-10-CM

## 2020-06-21 DIAGNOSIS — N321 Vesicointestinal fistula: Secondary | ICD-10-CM

## 2020-06-21 LAB — URINALYSIS, COMPLETE (UACMP) WITH MICROSCOPIC
Bilirubin Urine: NEGATIVE
Glucose, UA: NEGATIVE mg/dL
Ketones, ur: NEGATIVE mg/dL
Nitrite: POSITIVE — AB
Protein, ur: >300 mg/dL — AB
Specific Gravity, Urine: 1.02 (ref 1.005–1.030)
WBC, UA: >50 WBC/hpf (ref 0–5)
pH: 7.5 (ref 5.0–8.0)

## 2020-06-21 MED ORDER — AMOXICILLIN-POT CLAVULANATE 875-125 MG PO TABS
1.0000 | ORAL_TABLET | Freq: Two times a day (BID) | ORAL | 0 refills | Status: DC
Start: 2020-06-21 — End: 2020-06-29

## 2020-06-21 MED ORDER — FLUCONAZOLE 100 MG PO TABS
200.0000 mg | ORAL_TABLET | Freq: Every day | ORAL | 0 refills | Status: AC
Start: 2020-06-21 — End: 2020-07-01

## 2020-06-21 NOTE — Progress Notes (Signed)
06/21/2020 1:24 PM   William Holland [birth year] [id number]  Reason for visit: Follow up bladder stones, possible hydronephrosis  HPI: He is here with his brother today who provides most of his care.He is a 74 year old male with developmental delay and multiple co-morbidities(including congestive heart failure with ejection fraction 30% and cardiomyopathy)who was admitted in January 2020 with free air on CT but benign abdominal exam, thought to be secondary to diverticular disease.He was evaluated by general surgery. Ultimately, surgery was deferred secondary to his extreme high risk with his medical co-morbidities and lack of symptoms with hemodynamic stability. 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  At our hospital follow-up, as well as most recent follow-up in March 2021 we had elected a conservative approach with observation for his numerous asymptomatic bladder stones. He was referred for a general surgery evaluation at a tertiary care center, but they never went to this referral as he was feeling better and they were afraid to go out during [place].  He continues to do surprisingly well with minimal urinary symptoms and no abdominal pain at this time.  He continues to have diarrhea.  He denies any debris or particles in his urine, UTIs, gross hematuria, or flank pain.  He denies any difficulty urinating. He will have some occasional incontinence overnight.  He was re-referred to me recently for worsening renal function and suggestion of bilateral hydronephrosis on ultrasound and worsening renal function(creatinine 2.5 from baseline of 1.4), however a CT scan without contrast performed on 06/15/2020 showed a decompressed bladder with numerous bladder stones unchanged from prior and no evidence of hydronephrosis.  There is a significant amount of air in the bladder, as well as a small amount of air in the right kidney.  There is  continued wall thickening of the sigmoid colon with inflammatory changes and severe colonic dilation.  They still have not followed up with general surgery as recommended.  Today he continues to deny any significant urinary symptoms aside from some frequency overnight.  He denies any fevers or chills.  On exam, his abdomen is distended and tympanic which is his baseline.  There is no abdominal tenderness or peritoneal signs.  Urinalysis today is grossly concerning for infection with greater than 50 WBCs, 6-10 RBCs, large leukocytes, nitrite positive, WBC clumps, many bacteria, yeast present.  I had another long conversation with the patient and his caregiver brother today.  I reviewed the CT findings with them and my concern for a colovesical fistula, active UTI, and my concern for an underlying possible bowel pathology including diverticulitis or even malignancy.  They both remain very resistant to any intervention, as the patient feels well, and the caregiver has a number of other family responsibilities and sick family members he cares for.  After long conversation, he was amenable to at least seeing general surgery for discussion of further options including colonoscopy and/or consideration of colovesical fistula repair with simultaneous removal of bladder stones.  With his CT not showing any hydronephrosis, I do not have any clear evidence that his renal function is related to any obstruction.  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  [place],  [facility] [address] [place], [place] [address] [phone]

## 2020-06-23 LAB — URINE CULTURE: Culture: >=100000 — AB

## 2020-06-27 NOTE — Progress Notes (Signed)
Cardiology Office Note    Date:  06/29/2020   ID:  William Holland, DOB [birth year], MRN [id number]  PCP:  Patient, No Pcp Per  Cardiologist:  [name], MD  Electrophysiologist:  None   Chief Complaint: Follow-up  History of Present Illness:   William Holland is a 74 y.o. male with history of chronic combined systolic and diastolic CHF secondary toNICMdiagnosed in 12/2018, pulmonary hypertension, CKD stage II-III, diverticulitis, chronic diarrhea, anemia, and intellectual slowing who presents for follow-up ofhis cardiomyopathy.  He was admitted to the hospital in 12/2018 with a 71-monthhistory of watery diarrhea with 4-10 loose stools per day. He was found to have prominent colon distention with CT of the abdomen and pelvis showing free air suggestive of bowel perforation. However, he was hemodynamically stable. He was significantly volume overloaded. Echo showed an EF of 30 to 35% with diffuse hypokinesis, grade 2 diastolic dysfunction, mild mitral vegetation, moderately dilated left atrium, moderately reduced RV systolic function. He was evaluated by surgery regarding his bowel perforation with repeat CT showing a small amount of intraperitoneal air which was stable without contrast extravasation. In this setting, conservative management was recommended. With regards to his cardiomyopathy, he underwent diagnostic cardiac cath in 01/2019 which showed minimal nonobstructive RCA disease and otherwise normal coronary arteries. He did well on medical management though ran out of his Lasix in the summer 2020. When he was evaluated in 09/2019 he was volume overloaded with a ReDs vest reading of 42%. Lasix was resumed with follow-up ReDs vest reading 1 week later remaining elevated at 46%. With this, he was advised to take Lasix 40 mg daily for 5 days then 20 mg daily thereafter. In follow-up in 11/20, his weight was down 25 pounds with electrolytes and renal function noted to be stable. In this  setting, his Lasix was reduced to 10 mg daily given significant weight loss and ongoing daily diarrhea. He was seen in 11/2019 and was feeling relatively well though his weight was up 13 pounds when compared to his visit in mid 10/2019 with increase in bilateral lower extremity swelling. He reported compliance with Lasix and was trying to limit his salt intake. He was not weighing daily at home. He was advised to take Lasix 40 mg daily for 3 days then resume 20 mg daily thereafter. He was seen in the office in 12/2019 and was doing well. His weight was down 6 pounds compared to his visit in 11/2019 with significant improvement in lower extremity swelling and abdominal distension. He was ultimately transitioned to Lasix 20 mg alternating with 10 mg daily given slight elevations in follow up renal function.  Follow up echo in 03/2020, to evaluate his EF on maximally tolerated GDMT, showed an unchanged LVSF with an EF of 30-35%, global HK, moderately to severely dilated LV cavity, moderately elevated PASP, moderate MR, and mild to moderate TR. In the setting of worsening renal function, his Lasix and losartan have ultimately been discontinued and he has been referred to nephrology.  He has also been advised to follow up with GI in the setting of his chronic diarrhea.  Diuresis has been limited in the setting of his chronic volume loss with diarrhea and worsening renal function. He was last seen in the office on 05/18/2020 and was doing reasonably well from a cardiac perspective. He indicates nephrology changed one of his medications though the patient and brother were uncertain which. He reported a stable weight at home. When compared to his prior  clinic weight his weight was down 1 pound. He continued to have unchanged chronic watery diarrhea. Subsequent renal ultrasound showed bilateral hydronephrosis. CT renal stone on 06/15/2020 showed continued wall thickening of the sigmoid colon with mild inflammatory  surrounding changes possibly representing acute diverticulitis, unable to exclude underlying neoplasm, continued presence of diffuse colonic dilatation, multiple large bladder calculi with no hydronephrosis or renal obstruction noted. And follow-up with urology the patient and caregiver remained resistant to intervention of the above abnormal findings though ultimately agreed to be evaluated by general surgery.  He comes in today feeling about the same.  He denies any chest pain, dyspnea, lower extremity swelling, PND, or early satiety.  He notes continued abdominal distention/bloating and has stable 2-3 pillow orthopnea.  He does not add salt to food and is drinking less than 2 L of fluid per day.  He has been taking Lasix 10 mg on a daily basis with KCl 10 mEq daily.  His weight is down 3 pounds when compared to his last clinic weight and has been stable at home.  He will follow up with the surgeon later this month.  He continues to have unchanged chronic watery diarrhea.   Labs independently reviewed: 04/2020 - potassium 3.5 (improved from prior 2.8), BUN 36, SCr 2.55 09/2019-Hgb 10.8 PLT 158 01/2019-albumin 3.7, AST/ALT normal 12/2018-magnesium 2.0, TSH elevated at 6.424, free T4 low at 0.78, free T3 low at 1.3, A1c 5.2  Past Medical History:  Diagnosis Date  . Anemia   . Bowel perforation ([place])   . Chronic combined systolic (congestive) and diastolic (congestive) heart failure (HCC)    a. 12/2018 Echo: EF of 30 to 35%, diffuse hypokinesis, grade 2 diastolic dysfunction, mild mitral regurgitation, moderately dilated left atrium, moderately reduced RV systolic function, trivial pericardial effusion was identified, along with a right pleural effusion  . Chronic diarrhea   . Diverticulosis, sigmoid 02/27/2018   Colonoscopy done on 02/27/2018 by Dr. Marius Ditch  . NICM (nonischemic cardiomyopathy) ([place])    a. 12/2018 Echo: Ef 30-35%, diff HK, Gr2 DD.  Marland Kitchen Nonobstructive CAD (coronary artery disease)     a. 01/2019 Cath: LM nl, LAD nl, LCX nl, RCA min irregs.  . Stuttering during school years     Past Surgical History:  Procedure Laterality Date  . APPENDECTOMY    . COLONOSCOPY WITH PROPOFOL N/A 02/27/2018   Procedure: COLONOSCOPY WITH PROPOFOL;  Surgeon: Lin Landsman, MD;  Location: [facility] ENDOSCOPY;  Service: Gastroenterology;  Laterality: N/A;  . EYE SURGERY    . RIGHT/LEFT HEART CATH AND CORONARY ANGIOGRAPHY N/A 02/09/2019   Procedure: RIGHT/LEFT HEART CATH AND CORONARY ANGIOGRAPHY;  Surgeon: Wellington Hampshire, MD;  Location: [facility];  Service: Cardiovascular;  Laterality: N/A;    Current Medications: No outpatient medications have been marked as taking for the 06/29/20 encounter (Office Visit) with Rise Mu, PA-C.    Allergies:   Patient has no known allergies.   Social History   Socioeconomic History  . Marital status: Single    Spouse name: Not on file  . Number of children: Not on file  . Years of education: Not on file  . Highest education level: Not on file  Occupational History  . Occupation: retired  Tobacco Use  . Smoking status: Never Smoker  . Smokeless tobacco: Never Used  Vaping Use  . Vaping Use: Never used  Substance and Sexual Activity  . Alcohol use: No    Alcohol/week: 0.0 standard drinks  .  Drug use: No  . Sexual activity: Not on file    Comment: Not Asked  Other Topics Concern  . Not on file  Social History Narrative   Lives at home with his nephew.  Unsteady gait.   Social Determinants of Health   Financial Resource Strain:   . Difficulty of Paying Living Expenses:   Food Insecurity:   . Worried About Charity fundraiser in the Last Year:   . Arboriculturist in the Last Year:   Transportation Needs:   . Film/video editor (Medical):   Marland Kitchen Lack of Transportation (Non-Medical):   Physical Activity:   . Days of Exercise per Week:   . Minutes of Exercise per Session:   Stress:   . Feeling of Stress :   Social  Connections:   . Frequency of Communication with Friends and Family:   . Frequency of Social Gatherings with Friends and Family:   . Attends Religious Services:   . Active Member of Clubs or Organizations:   . Attends Archivist Meetings:   Marland Kitchen Marital Status:      Family History:  The patient's family history includes Cancer in his brother and father; Uterine cancer in his mother.  ROS:   Review of Systems  Constitutional: Positive for malaise/fatigue. Negative for chills, diaphoresis, fever and weight loss.  HENT: Negative for congestion.   Eyes: Negative for discharge and redness.  Respiratory: Positive for shortness of breath. Negative for cough, sputum production and wheezing.        Stable dyspnea  Cardiovascular: Negative for chest pain, palpitations, orthopnea, claudication, leg swelling and PND.  Gastrointestinal: Positive for constipation. Negative for abdominal pain, blood in stool, diarrhea, heartburn, melena, nausea and vomiting.       Abdominal bloating  Musculoskeletal: Negative for falls and myalgias.  Skin: Negative for rash.  Neurological: Positive for weakness. Negative for dizziness, tingling, tremors, sensory change, speech change, focal weakness and loss of consciousness.  Endo/Heme/Allergies: Does not bruise/bleed easily.  Psychiatric/Behavioral: Negative for substance abuse. The patient is not nervous/anxious.   All other systems reviewed and are negative.    EKGs/Labs/Other Studies Reviewed:    Studies reviewed were summarized above. The additional studies were reviewed today: As above  EKG:  EKG is ordered today.  The EKG ordered today demonstrates NSR, 83 bpm, LVH with repolarization abnormality, improved nonspecific ST-T changes  Recent Labs: 04/28/2020: ALT 17 05/02/2020: Hemoglobin 9.3; Platelets 248 05/04/2020: Magnesium 2.3 05/11/2020: BUN 36; Creatinine, Ser 2.55; Potassium 3.5; Sodium 142  Recent Lipid Panel No results found for: CHOL,  TRIG, HDL, CHOLHDL, VLDL, LDLCALC, LDLDIRECT  PHYSICAL EXAM:    VS:  BP 100/60 (BP Location: Left Arm, Patient Position: Sitting, Cuff Size: Normal)   Pulse 83   Ht 5' 6"  (1.676 m)   Wt 146 lb 4 oz (66.3 kg)   SpO2 98%   BMI 23.61 kg/m   BMI: Body mass index is 23.61 kg/m.  Physical Exam Constitutional:      Appearance: He is well-developed.  HENT:     Head: Normocephalic and atraumatic.  Eyes:     General:        Right eye: No discharge.        Left eye: No discharge.  Neck:     Vascular: No JVD.  Cardiovascular:     Rate and Rhythm: Normal rate and regular rhythm.     Pulses: No midsystolic click and no opening snap.  Posterior tibial pulses are 2+ on the right side and 2+ on the left side.     Heart sounds: Normal heart sounds, S1 normal and S2 normal. Heart sounds not distant. No murmur heard.  No friction rub.  Pulmonary:     Effort: Pulmonary effort is normal. No respiratory distress.     Breath sounds: Normal breath sounds. No decreased breath sounds, wheezing or rales.  Chest:     Chest wall: No tenderness.  Abdominal:     General: There is distension.     Palpations: Abdomen is soft.     Tenderness: There is no abdominal tenderness.  Musculoskeletal:     Cervical back: Normal range of motion.  Skin:    General: Skin is warm and dry.     Nails: There is no clubbing.  Neurological:     Mental Status: He is alert and oriented to person, place, and time.  Psychiatric:        Speech: Speech normal.        Behavior: Behavior normal.        Thought Content: Thought content normal.        Judgment: Judgment normal.     Wt Readings from Last 3 Encounters:  06/29/20 146 lb 4 oz (66.3 kg)  06/21/20 146 lb (66.2 kg)  05/18/20 149 lb 6 oz (67.8 kg)     ASSESSMENT & PLAN:   1. Chronic combined systolic and diastolic CHF secondary to NICM/pulmonary hypertension/anasarca: NYHA class III.  His weight is down 3 pounds today when compared to last clinic  visit though he does have abdominal distention noted on exam.  Check CMP and CBC.  Based on these results consider transition from furosemide to torsemide with recommendation to maintain potassium at goal 4.0.  In the setting of relative hypotension with worsening renal function and persistent watery diarrhea escalation of GDMT has been limited.  He remains off ACE inhibitor/ARB/Entresto/MRA secondary to relative hypotension and underlying renal disease.  Continue Toprol-XL.  2. CKD stage II-III: Check CMP  3. Chronic diarrhea with abnormal abdominal imaging: He will be following up with the general surgeon later this month.  Recommend he follow-up with PCP and GI as directed.  This has been a longstanding issue and has limited management of his heart failure to some degree given volume depletion through diarrhea as well as persistent hypokalemia.  In the past, both patient and family have been hesitant at addressing this issue though they do seem to be more open to this currently.  4. Hypokalemia: Improved on last recheck at 3.5.  Check labs as above with recommendation to replete to goal 4.0.  Prior magnesium 2.5.  Disposition: F/u with Dr. Fletcher Anon or an APP in 2 months.   Medication Adjustments/Labs and Tests Ordered: Current medicines are reviewed at length with the patient today.  Concerns regarding medicines are outlined above. Medication changes, Labs and Tests ordered today are summarized above and listed in the Patient Instructions accessible in Encounters.   Signed, Christell Faith, PA-C 06/29/2020 2:53 PM     [address], [place] [address] [phone]

## 2020-06-29 ENCOUNTER — Ambulatory Visit: Payer: Medicare Other | Admitting: Physician Assistant

## 2020-06-29 ENCOUNTER — Encounter: Payer: Self-pay | Admitting: Physician Assistant

## 2020-06-29 ENCOUNTER — Ambulatory Visit: Payer: Medicare Other | Admitting: Surgery

## 2020-06-29 ENCOUNTER — Other Ambulatory Visit: Payer: Self-pay

## 2020-06-29 VITALS — BP 100/60 | HR 83 | Ht 66.0 in | Wt 146.2 lb

## 2020-06-29 DIAGNOSIS — I428 Other cardiomyopathies: Secondary | ICD-10-CM

## 2020-06-29 DIAGNOSIS — I5042 Chronic combined systolic (congestive) and diastolic (congestive) heart failure: Secondary | ICD-10-CM | POA: Diagnosis not present

## 2020-06-29 DIAGNOSIS — I2721 Secondary pulmonary arterial hypertension: Secondary | ICD-10-CM

## 2020-06-29 DIAGNOSIS — R601 Generalized edema: Secondary | ICD-10-CM

## 2020-06-29 DIAGNOSIS — E876 Hypokalemia: Secondary | ICD-10-CM

## 2020-06-29 DIAGNOSIS — K529 Noninfective gastroenteritis and colitis, unspecified: Secondary | ICD-10-CM

## 2020-06-29 DIAGNOSIS — N183 Chronic kidney disease, stage 3 unspecified: Secondary | ICD-10-CM

## 2020-06-29 NOTE — Patient Instructions (Signed)
Medication Instructions:  Your physician recommends that you continue on your current medications as directed. Please refer to the Current Medication list given to you today.  *If you need a refill on your cardiac medications before your next appointment, please call your pharmacy*   Lab Work: Cbc, Cmet today If you have labs (blood work) drawn today and your tests are completely normal, you will receive your results only by: Marland Kitchen MyChart Message (if you have MyChart) OR . A paper copy in the mail If you have any lab test that is abnormal or we need to change your treatment, we will call you to review the results.   Testing/Procedures: None ordered   Follow-Up: At [facility], you and your health needs are our priority.  As part of our continuing mission to provide you with exceptional heart care, we have created designated Provider Care Teams.  These Care Teams include your primary Cardiologist (physician) and Advanced Practice Providers (APPs -  Physician Assistants and Nurse Practitioners) who all work together to provide you with the care you need, when you need it.  We recommend signing up for the patient portal called "MyChart".  Sign up information is provided on this After Visit Summary.  MyChart is used to connect with patients for Virtual Visits (Telemedicine).  Patients are able to view lab/test results, encounter notes, upcoming appointments, etc.  Non-urgent messages can be sent to your provider as well.   To learn more about what you can do with MyChart, go to [email or web address].    Your next appointment:   2 month(s)  The format for your next appointment:   In Person  Provider:    You may see [name], MD or one of the following Advanced Practice Providers on your designated Care Team:    Murray Hodgkins, NP  Christell Faith, PA-C  Marrianne Mood, PA-C    Other Instructions N/A

## 2020-06-30 LAB — COMPREHENSIVE METABOLIC PANEL
ALT: 9 IU/L (ref 0–44)
AST: 19 IU/L (ref 0–40)
Albumin/Globulin Ratio: 1.3 (ref 1.2–2.2)
Albumin: 4.3 g/dL (ref 3.7–4.7)
Alkaline Phosphatase: 76 IU/L (ref 48–121)
BUN/Creatinine Ratio: 20 (ref 10–24)
BUN: 47 mg/dL — ABNORMAL HIGH (ref 8–27)
Bilirubin Total: 0.3 mg/dL (ref 0.0–1.2)
CO2: 25 mmol/L (ref 20–29)
Calcium: 9.3 mg/dL (ref 8.6–10.2)
Chloride: 106 mmol/L (ref 96–106)
Creatinine, Ser: 2.36 mg/dL — ABNORMAL HIGH (ref 0.76–1.27)
GFR calc Af Amer: 30 mL/min/{1.73_m2} — ABNORMAL LOW (ref >59)
GFR calc non Af Amer: 26 mL/min/{1.73_m2} — ABNORMAL LOW (ref >59)
Globulin, Total: 3.3 g/dL (ref 1.5–4.5)
Glucose: 90 mg/dL (ref 65–99)
Potassium: 3.6 mmol/L (ref 3.5–5.2)
Sodium: 143 mmol/L (ref 134–144)
Total Protein: 7.6 g/dL (ref 6.0–8.5)

## 2020-06-30 LAB — CBC WITH DIFFERENTIAL/PLATELET
Basophils Absolute: 0.1 10*3/uL (ref 0.0–0.2)
Basos: 1 %
EOS (ABSOLUTE): 0.4 10*3/uL (ref 0.0–0.4)
Eos: 5 %
Hematocrit: 33.1 % — ABNORMAL LOW (ref 37.5–51.0)
Hemoglobin: 10.7 g/dL — ABNORMAL LOW (ref 13.0–17.7)
Immature Grans (Abs): 0 10*3/uL (ref 0.0–0.1)
Immature Granulocytes: 0 %
Lymphocytes Absolute: 1.9 10*3/uL (ref 0.7–3.1)
Lymphs: 23 %
MCH: 29.1 pg (ref 26.6–33.0)
MCHC: 32.3 g/dL (ref 31.5–35.7)
MCV: 90 fL (ref 79–97)
Monocytes Absolute: 0.6 10*3/uL (ref 0.1–0.9)
Monocytes: 7 %
Neutrophils Absolute: 5.3 10*3/uL (ref 1.4–7.0)
Neutrophils: 64 %
Platelets: 199 10*3/uL (ref 150–450)
RBC: 3.68 x10E6/uL — ABNORMAL LOW (ref 4.14–5.80)
RDW: 14.2 % (ref 11.6–15.4)
WBC: 8.2 10*3/uL (ref 3.4–10.8)

## 2020-07-04 ENCOUNTER — Telehealth: Payer: Self-pay

## 2020-07-04 DIAGNOSIS — I5042 Chronic combined systolic (congestive) and diastolic (congestive) heart failure: Secondary | ICD-10-CM

## 2020-07-04 MED ORDER — TORSEMIDE 20 MG PO TABS
20.0000 mg | ORAL_TABLET | Freq: Every day | ORAL | 3 refills | Status: DC
Start: 2020-07-04 — End: 2020-07-15

## 2020-07-04 NOTE — Telephone Encounter (Signed)
Call to patient to review labs.    Pt verbalized understanding and has no further questions at this time.    Advised pt to call for any further questions or concerns.  Orders placed as advised.

## 2020-07-04 NOTE — Telephone Encounter (Signed)
-----   Message from Rise Mu, PA-C sent at 06/30/2020  7:13 AM EDT ----- Blood count mildly low though slightly improved when compared to prior and at his approximate baseline. Random glucose normal. Renal function remains elevated though is improved when compared to prior. Liver function normal.  Recommendations: -Stop Lasix -Start torsemide 20 mg daily -Continue KCl 10 mEq daily -Recheck to BMET 1 week after starting torsemide

## 2020-07-11 ENCOUNTER — Telehealth: Payer: Self-pay | Admitting: Physician Assistant

## 2020-07-11 ENCOUNTER — Other Ambulatory Visit: Payer: Self-pay

## 2020-07-11 ENCOUNTER — Other Ambulatory Visit
Admission: RE | Admit: 2020-07-11 | Discharge: 2020-07-11 | Disposition: A | Discharge status: Home / Self Care | Payer: Medicare Other | Attending: Physician Assistant | Admitting: Physician Assistant

## 2020-07-11 DIAGNOSIS — I5042 Chronic combined systolic (congestive) and diastolic (congestive) heart failure: Secondary | ICD-10-CM | POA: Diagnosis present

## 2020-07-11 LAB — BASIC METABOLIC PANEL
Anion gap: 10 (ref 5–15)
BUN: 54 mg/dL — ABNORMAL HIGH (ref 8–23)
CO2: 28 mmol/L (ref 22–32)
Calcium: 8.9 mg/dL (ref 8.9–10.3)
Chloride: 102 mmol/L (ref 98–111)
Creatinine, Ser: 2.46 mg/dL — ABNORMAL HIGH (ref 0.61–1.24)
GFR calc Af Amer: 29 mL/min — ABNORMAL LOW (ref >60)
GFR calc non Af Amer: 25 mL/min — ABNORMAL LOW (ref >60)
Glucose, Bld: 88 mg/dL (ref 70–99)
Potassium: 2.4 mmol/L — CL (ref 3.5–5.1)
Sodium: 140 mmol/L (ref 135–145)

## 2020-07-11 MED ORDER — POTASSIUM CHLORIDE ER 10 MEQ PO TBCR: 20.0000 meq | EXTENDED_RELEASE_TABLET | Freq: Every day | ORAL | 3 refills | Status: DC

## 2020-07-11 NOTE — Telephone Encounter (Signed)
Results for critical value tx iva

## 2020-07-11 NOTE — Telephone Encounter (Signed)
Verbalized understanding of critical result received, K of 2.4.   Made provider aware, Christell Faith, PA of critical result.   Staff message from Christell Faith, [place]: "Please have him take KCl 30 mEq bid x 1 day then resume at higher dose of 20 mEq daily. Recheck BMET 7/21 or 7/22"    Call attempted to reach brother, [name]. No answer. LMTCB.   Call to patient to review labs.    Spoke to brother [name]. He verbalized understanding and has no further questions at this time.    Advised pt to call for any further questions or concerns.  Orders updated as advised.  Repeat labs later this week.

## 2020-07-15 ENCOUNTER — Other Ambulatory Visit
Admission: RE | Admit: 2020-07-15 | Discharge: 2020-07-15 | Disposition: A | Discharge status: Home / Self Care | Payer: Medicare Other | Attending: Physician Assistant | Admitting: Physician Assistant

## 2020-07-15 ENCOUNTER — Ambulatory Visit (INDEPENDENT_AMBULATORY_CARE_PROVIDER_SITE_OTHER): Payer: Medicare Other | Admitting: Surgery

## 2020-07-15 ENCOUNTER — Telehealth: Payer: Self-pay

## 2020-07-15 ENCOUNTER — Encounter: Payer: Self-pay | Admitting: Surgery

## 2020-07-15 ENCOUNTER — Other Ambulatory Visit: Payer: Self-pay

## 2020-07-15 VITALS — BP 115/64 | HR 87 | Temp 97.6°F | Resp 14 | Ht 66.0 in | Wt 143.8 lb

## 2020-07-15 DIAGNOSIS — K56699 Other intestinal obstruction unspecified as to partial versus complete obstruction: Secondary | ICD-10-CM | POA: Diagnosis not present

## 2020-07-15 DIAGNOSIS — I5042 Chronic combined systolic (congestive) and diastolic (congestive) heart failure: Secondary | ICD-10-CM

## 2020-07-15 LAB — BASIC METABOLIC PANEL
Anion gap: 12 (ref 5–15)
BUN: 86 mg/dL — ABNORMAL HIGH (ref 8–23)
CO2: 29 mmol/L (ref 22–32)
Calcium: 9.5 mg/dL (ref 8.9–10.3)
Chloride: 101 mmol/L (ref 98–111)
Creatinine, Ser: 2.71 mg/dL — ABNORMAL HIGH (ref 0.61–1.24)
GFR calc Af Amer: 26 mL/min — ABNORMAL LOW (ref >60)
GFR calc non Af Amer: 22 mL/min — ABNORMAL LOW (ref >60)
Glucose, Bld: 110 mg/dL — ABNORMAL HIGH (ref 70–99)
Potassium: 3 mmol/L — ABNORMAL LOW (ref 3.5–5.1)
Sodium: 142 mmol/L (ref 135–145)

## 2020-07-15 MED ORDER — POTASSIUM CHLORIDE ER 10 MEQ PO TBCR: 20.0000 meq | EXTENDED_RELEASE_TABLET | Freq: Every day | ORAL | 3 refills | Status: AC | PRN

## 2020-07-15 MED ORDER — TORSEMIDE 20 MG PO TABS: 20.0000 mg | ORAL_TABLET | Freq: Every day | ORAL | 3 refills | Status: DC | PRN

## 2020-07-15 NOTE — Telephone Encounter (Signed)
-----   Message from William Mu, PA-C sent at 07/15/2020 10:34 AM EDT ----- Renal function continues to uptrend. Potassium is low, though slightly improved from prior.   Recommendations: -He has only been on torsemide 20 mg daily -Hold torsemide as he has chronic watery diarrhea which makes diuresis difficult  -Take torsemide on an as needed basis moving forward for weight gain > 3 pounds overnight or 5 pounds in a week -Please take KCl 40 mEq x 1 then only take KCl 20 mEq when takes torsemide -He will need a follow up BMET on Monday, 7/26 to ensure improving renal function and potassium  -Follow up with nephrology

## 2020-07-15 NOTE — Telephone Encounter (Signed)
Call to patient to review labs.    Pt brother and wife verbalized understanding and has no further questions at this time.    Advised to call for any further questions or concerns.  Rx updated as requested. [facility].

## 2020-07-15 NOTE — Patient Instructions (Addendum)
We have placed a referral to [facility] in [place]. Their office will contact you within 7-10 days to schedule an appointment. If you do not hear from their office please contact the office back.   Follow up as needed, call the office if you have any questions or concerns.

## 2020-07-15 NOTE — Progress Notes (Signed)
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 Willeen Cass  History of Present Illness: William Holland is a 74 y.o. male presenting for evaluation of possible colovesical fistula.  The patient has a history of diverticulitis, and actually had perforated diverticulitis with free air in 12/2018.  He was deemed too high risk for surgery by cardiology, and he was treated conservatively.  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    Currently he denies any abdominal pain.  Denies any nausea, vomiting.  Reports that his bowel movements are strictly diarrhea, but denies any pain with bowel movements.  Denies any pain with urination, but does report urgency and frequency.  Denies any stool in the urine or pneumaturia, but reports small stones in his urine.  He did have a colonoscopy with Dr. Marius Ditch in 02/2018 which showed diverticulosis with significant stricture in the sigmoid colon.  In his CT scans, he has been noted to have significant colonic distention proximal to the sigmoid colon, with fecalization of the small bowel.  Most recent CT scan was on 06/15/20, which I have personally viewed.  He does have some inflammatory changes of the sigmoid colon, likely the stricture noted on his colonoscopy, but I do not see true evidence of a colovesical fistula.  He has a history of non-ischemic cardiomyopathy, with combined systolic and diastolic heart failure, and had an echo from 12/2018 showing an EF of 30-35%.  His fluid status is very labile and is constantly having to increase and decrease his Lasix dosage.  Past Medical History: Past Medical History:  Diagnosis Date   Anemia    Bowel perforation (HCC)    Chronic combined systolic (congestive) and diastolic (congestive) heart failure ([place])    a. 12/2018 Echo: EF of 30 to 35%,  diffuse hypokinesis, grade 2 diastolic dysfunction, mild mitral regurgitation, moderately dilated left atrium, moderately reduced RV systolic function, trivial pericardial effusion was identified, along with a right pleural effusion   Chronic diarrhea    Diverticulosis, sigmoid 02/27/2018   Colonoscopy done on 02/27/2018 by Dr. Marius Ditch   NICM (nonischemic cardiomyopathy) ([place])    a. 12/2018 Echo: Ef 30-35%, diff HK, Gr2 DD.   Nonobstructive CAD (coronary artery disease)    a. 01/2019 Cath: LM nl, LAD nl, LCX nl, RCA min irregs.   Stuttering during school years      Past Surgical History: Past Surgical History:  Procedure Laterality Date   APPENDECTOMY     COLONOSCOPY WITH PROPOFOL N/A 02/27/2018   Procedure: COLONOSCOPY WITH PROPOFOL;  Surgeon: Lin Landsman, MD;  Location: [facility];  Service: Gastroenterology;  Laterality: N/A;   EYE SURGERY     RIGHT/LEFT HEART CATH AND CORONARY ANGIOGRAPHY N/A 02/09/2019   Procedure: RIGHT/LEFT HEART CATH AND CORONARY ANGIOGRAPHY;  Surgeon: Wellington Hampshire, MD;  Location: [facility];  Service: Cardiovascular;  Laterality: N/A;    Home Medications: Prior to Admission medications   Medication Sig Start Date End Date Taking? Authorizing Provider  levothyroxine (SYNTHROID, LEVOTHROID) 50 MCG tablet Take 1 tablet (50 mcg total) by mouth daily before breakfast. 01/14/19  Yes Gladstone Lighter, MD  metoprolol succinate (TOPROL XL) 25 MG 24 hr tablet Take 0.5 tablets (12.5 mg total) by mouth daily. 01/15/20  Yes Darylene Price A, FNP  potassium chloride (KLOR-CON) 10 MEQ tablet  Take 2 tablets (20 mEq total) by mouth daily. 07/11/20  Yes Dunn, Areta Haber, PA-C  torsemide (DEMADEX) 20 MG tablet Take 1 tablet (20 mg total) by mouth daily. 07/04/20 10/02/20 Yes Dunn, Areta Haber, PA-C    Allergies: No Known Allergies  Social History:  reports that he has never smoked. He has never used smokeless tobacco. He reports that he does not drink alcohol  and does not use drugs.   Family History: Family History  Problem Relation Age of Onset   Cancer Brother    Cancer Father    Uterine cancer Mother     Review of Systems: Review of Systems  Constitutional: Negative for chills and fever.  HENT: Negative for hearing loss.   Respiratory: Negative for shortness of breath.   Cardiovascular: Negative for chest pain.  Gastrointestinal: Positive for diarrhea. Negative for abdominal pain, blood in stool, constipation, nausea and vomiting.  Genitourinary: Positive for frequency and urgency. Negative for dysuria.  Musculoskeletal: Negative for myalgias.  Skin: Negative for rash.  Neurological: Negative for dizziness.  Psychiatric/Behavioral: Negative for depression.    Physical Exam BP (!) 115/64    Pulse 87    Temp 97.6 F (36.4 C)    Resp 14    Ht 5' 6"  (1.676 m)    Wt 143 lb 12.8 oz (65.2 kg)    SpO2 98%    BMI 23.21 kg/m  CONSTITUTIONAL: No acute distress HEENT:  Normocephalic, atraumatic, extraocular motion intact. NECK: Trachea is midline, and there is no jugular venous distension.  RESPIRATORY:  Lungs are clear, and breath sounds are equal bilaterally. Normal respiratory effort without pathologic use of accessory muscles. CARDIOVASCULAR: Heart is regular without murmurs, gallops, or rubs. GI: The abdomen is soft, distended, but not tender to palpation anywhere. MUSCULOSKELETAL:  Normal muscle strength and tone in all four extremities.  No peripheral edema or cyanosis. SKIN: Skin turgor is normal. There are no pathologic skin lesions.  NEUROLOGIC:  Motor and sensation is grossly normal.  Cranial nerves are grossly intact. PSYCH:  Alert and oriented to person, place and time. Affect is normal.  Laboratory Analysis: Labs 06/29/20: Na 143, K 3.6, Cl 106, CO2 25, BUN 47, Cr 2.36, LFTs within normal.  WBC 8.2, Hgb 10.7, Hct 33.1, Plt 199.    U/A 06/21/20: Large LE, positive nitrite, many bacteria, WBC clumps present.  Urine culture  showed > 100,000 colonies with multiple species.  Imaging: CT scan abdomen/pelvis 06/15/20: IMPRESSION: 1. Continued wall thickening of the sigmoid colon is noted with mild surrounding inflammatory changes which may represent acute diverticulitis, or potentially chronic sequela of previous such inflammation. Underlying neoplasm cannot be excluded. Sigmoidoscopy is recommended at some point. 2. Continued presence of severe diffuse colonic dilatation is noted concerning for either ileus or distal colonic obstruction. 3. Multiple large bladder calculi are again noted. No hydronephrosis or renal obstruction is noted. 4. Stable mild prostatic enlargement.  Assessment and Plan: This is a 74 y.o. male with history of previous diverticulitis and diverticular stricture.  Reviewing the patient's imaging, I do not see true evidence of a colovesical fistula, and although the patient does have some urinary issues, these could be attributed to the bladder stones he has.  Denies any stool in his urine, but instead reports some small stones sometimes.    Discussed with him that my concern otherwise, is the significant distention that his diverticular stricture is causing and although this is chronic, this could lead to worse problems such as perforation.  Discussed with him that for this, he would require a sigmoidectomy, likely open due to the significant colonic distention, and in that time point, the bladder could be assessed as well and if any fistula, repaired at the same time.  Discussed with Dr. Diamantina Providence and the bladder stones are not amenable for endoscopic management, and he would require open extraction during surgery.  However, the most pressing issue with regards to surgery is his heart condition.  In the prior urgent/emergent setting, he was not deemed a surgical candidate.  He has significant cardiomyopathy, low EF of 30-35%, and his fluid status is quite labile.  I do not think he would be a good  candidate in this hospital due to that complexity, and may benefit instead from being in a tertiary center.  The case alone is complex enough, but he would also be a high risk of fluid shifts, dehydration, AKI, worsening CHF.  I discussed with the patient and his brother, and they would like a referral to our colleagues in [place].  We will send a referral to [facility].  Follow up with [place] as needed.  Face-to-face time spent with the patient and care providers was 60 minutes, with more than 50% of the time spent counseling, educating, and coordinating care of the patient.     Melvyn Neth, [facility]

## 2020-07-19 ENCOUNTER — Telehealth: Payer: Self-pay | Admitting: Emergency Medicine

## 2020-07-19 NOTE — Telephone Encounter (Signed)
[facility] received referral. Per Earnest Bailey they have called patients brother, at the time is is unable to schedule an appt. Per Earnest Bailey they are to retry calling patients brother in a couple of weeks to see if he is ready to schedule an appt.

## 2020-08-25 NOTE — Progress Notes (Signed)
Cardiology Office Note    Date:  09/01/2020   ID:  William Holland, DOB 09-Sep-1946, MRN [id number]  PCP:  Patient, No Pcp Per  Cardiologist:  [name], MD  Electrophysiologist:  None   Chief Complaint: Follow up  History of Present Illness:   William Holland is a 74 y.o. male with history of chronic combined systolic and diastolic CHF secondary toNICMdiagnosed in 12/2018, pulmonary hypertension, CKD stage II-III, diverticulitis, chronic diarrhea, anemia, and intellectual slowing who presents for follow-up ofhis cardiomyopathy.  William Holland was admitted to the hospital in 12/2018 with a 25-monthhistory of watery diarrhea with 4-10 loose stools per day. William Holland was found to have prominent colon distention with CT of the abdomen and pelvis showing free air suggestive of bowel perforation. However, William Holland was hemodynamically stable. William Holland was significantly volume overloaded. Echo showed an EF of 30 to 35% with diffuse hypokinesis, grade 2 diastolic dysfunction, mild mitral vegetation, moderately dilated left atrium, moderately reduced RV systolic function. William Holland was evaluated by surgery regarding his bowel perforation with repeat CT showing a small amount of intraperitoneal air which was stable without contrast extravasation. In this setting, conservative management was recommended. With regards to his cardiomyopathy, William Holland underwent diagnostic cardiac cath in 01/2019 which showed minimal nonobstructive RCA disease and otherwise normal coronary arteries. William Holland did well on medical management though ran out of his Lasix in the summer 2020. When William Holland was evaluated in 09/2019 William Holland was volume overloaded with a ReDs vest reading of 42%. Lasix was resumed with follow-up ReDs vest reading 1 week later remaining elevated at 46%. With this, William Holland was advised to take Lasix 40 mg daily for 5 days then 20 mg daily thereafter. In follow-up in 11/20, his weight was down 25 pounds with electrolytes and renal function noted to be stable. In this  setting, his Lasix was reduced to 10 mg daily given significant weight loss and ongoing daily diarrhea. William Holland was seen in 11/2019 and was feeling relatively well though his weight was up 13 pounds when compared to his visit in mid 10/2019 with increase in bilateral lower extremity swelling. William Holland reported compliance with Lasix and was trying to limit his salt intake. William Holland was not weighing daily at home. William Holland was advised to take Lasix 40 mg daily for 3 days then resume 20 mg daily thereafter. William Holland was seen in the office in 12/2019 and was doing well. His weight was down 6 pounds compared to his visit in 11/2019 with significant improvement in lower extremity swelling and abdominal distension. William Holland was ultimately transitioned to Lasix 20 mg alternating with 10 mg daily given slight elevations in follow up renal function.Follow up echo in 03/2020, to evaluate his EF on maximally tolerated GDMT, showed an unchanged LVSF with an EF of30-35%, global HK, moderately to severely dilated LV cavity, moderately elevated PASP, moderate MR, and mild to moderate TR. In the setting of worsening renal function, his Lasix and losartan have ultimately been discontinued and William Holland has been referred to nephrology. William Holland has also been advised to follow up with GI in the setting of his chronic diarrhea. Diuresis has been limited in the setting of his chronic volume loss with diarrhea and worsening renal function. William Holland was seen in the office on 05/18/2020 and was doing reasonably well from a cardiac perspective. William Holland indicated nephrology changed one of his medications though the patient and brother were uncertain which. William Holland reported a stable weight at home. When compared to his prior clinic weight his weight was  down 1 pound. William Holland continued to have unchanged chronic watery diarrhea. Subsequent renal ultrasound showed bilateral hydronephrosis. CT renal stone on 06/15/2020 showed continued wall thickening of the sigmoid colon with mild inflammatory surrounding  changes possibly representing acute diverticulitis, unable to exclude underlying neoplasm, continued presence of diffuse colonic dilatation, multiple large bladder calculi with no hydronephrosis or renal obstruction noted. In follow-up with urology the patient and caregiver remained resistant to intervention of the above abnormal findings though ultimately agreed to be evaluated by general surgery.  William Holland was last seen on 06/29/2020 and was doing reasonably well. His weight was down 3 pounds when compared to his prior clinic visit.  William Holland continued to have unchanged chronic watery diarrhea.  Hypotension with persistent watery diarrhea precluded escalation of GDMT.  Labs obtained at that time showed severe hypokalemia of 2.6 which was repleted to 3.0 as below, which was also repleted, though follow up labs were not completed.  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 Given his extensive comorbid conditions, including labile fluid shifts and underlying cardiomyopathy, it was felt if surgery is undertaken, for this to be performed at a tertiary medical center and William Holland was referred to [facility]. Patient or family have not yet made this appointment.   William Holland comes in today accompanied by his brother and feels about the same to slightly better when William Holland was seen in 06/2020.  William Holland notes stable exertional dyspnea without chest pain, lower extremity swelling, PND, or early satiety.  His abdominal swelling is about the same.  William Holland has stable two-pillow orthopnea.  When William Holland was seen back in 06/2020 with worsening renal function we advised William Holland transitioned from torsemide 20 mg daily to as needed dosing.  However, it now appears William Holland has been taking torsemide 20 mg twice daily at that time and subsequently decreased his dose to 20 mg daily following labs in 06/2020.  William Holland is uncertain about his weight trend, last weighing approximately 1 month ago with a weight of 148  pounds.  William Holland continues to have chronic watery diarrhea on a daily basis.  Patient and family do not want to proceed with further surgical evaluation of his diverticular stricture.  William Holland sees nephrology later this month and prefers to have labs drawn with them and faxed to [place].   Labs independently reviewed: 06/2020 - potassium 3.0, BUN 86, SCr 2.71, albumin 4.3, AST/ALT normal, HGB 10.7, PLT 199 12/2018-magnesium 2.0, TSH elevated at 6.424, free T4 low at 0.78, free T3 low at 1.3, A1c5.2   Past Medical History:  Diagnosis Date   Anemia    Bowel perforation (HCC)    Chronic combined systolic (congestive) and diastolic (congestive) heart failure ([place])    a. 12/2018 Echo: EF of 30 to 35%, diffuse hypokinesis, grade 2 diastolic dysfunction, mild mitral regurgitation, moderately dilated left atrium, moderately reduced RV systolic function, trivial pericardial effusion was identified, along with a right pleural effusion   Chronic diarrhea    Diverticulosis, sigmoid 02/27/2018   Colonoscopy done on 02/27/2018 by Dr. Marius Ditch   NICM (nonischemic cardiomyopathy) ([place])    a. 12/2018 Echo: Ef 30-35%, diff HK, Gr2 DD.   Nonobstructive CAD (coronary artery disease)    a. 01/2019 Cath: LM nl, LAD nl, LCX nl, RCA min irregs.   Stuttering during school years     Past Surgical History:  Procedure Laterality Date   APPENDECTOMY     COLONOSCOPY WITH PROPOFOL N/A 02/27/2018  Procedure: COLONOSCOPY WITH PROPOFOL;  Surgeon: Lin Landsman, MD;  Location[facility] ENDOSCOPY;  Service: Gastroenterology;  Laterality: N/A;   EYE SURGERY     RIGHT/LEFT HEART CATH AND CORONARY ANGIOGRAPHY N/A 02/09/2019   Procedure: RIGHT/LEFT HEART CATH AND CORONARY ANGIOGRAPHY;  Surgeon: Wellington Hampshire, MD;  Location: [facility] CV LAB;  Service: Cardiovascular;  Laterality: N/A;    Current Medications: Current Meds  Medication Sig   furosemide (LASIX) 20 MG tablet Take by mouth.   levothyroxine (SYNTHROID,  LEVOTHROID) 50 MCG tablet Take 1 tablet (50 mcg total) by mouth daily before breakfast.   losartan (COZAAR) 25 MG tablet Take 12.5 mg by mouth daily.   metoprolol succinate (TOPROL XL) 25 MG 24 hr tablet Take 0.5 tablets (12.5 mg total) by mouth daily.   potassium chloride (KLOR-CON) 10 MEQ tablet Take 2 tablets (20 mEq total) by mouth daily as needed (when taking fluid pill).   torsemide (DEMADEX) 20 MG tablet Take 1 tablet (20 mg total) by mouth daily as needed (weight gain).    Allergies:   Patient has no known allergies.   Social History   Socioeconomic History   Marital status: Single    Spouse name: Not on file   Number of children: Not on file   Years of education: Not on file   Highest education level: Not on file  Occupational History   Occupation: retired  Tobacco Use   Smoking status: Never Smoker   Smokeless tobacco: Never Used  Scientific laboratory technician Use: Never used  Substance and Sexual Activity   Alcohol use: No    Alcohol/week: 0.0 standard drinks   Drug use: No   Sexual activity: Not on file    Comment: Not Asked  Other Topics Concern   Not on file  Social History Narrative   Lives at home with his nephew.  Unsteady gait.   Social Determinants of Health   Financial Resource Strain:    Difficulty of Paying Living Expenses: Not on file  Food Insecurity:    Worried About Charity fundraiser in the Last Year: Not on file   [employer] of Food in the Last Year: Not on file  Transportation Needs:    Lack of Transportation (Medical): Not on file   Lack of Transportation (Non-Medical): Not on file  Physical Activity:    Days of Exercise per Week: Not on file   Minutes of Exercise per Session: Not on file  Stress:    Feeling of Stress : Not on file  Social Connections:    Frequency of Communication with Friends and Family: Not on file   Frequency of Social Gatherings with Friends and Family: Not on file   Attends Religious Services: Not  on file   Active Member of Clubs or Organizations: Not on file   Attends Archivist Meetings: Not on file   Marital Status: Not on file     Family History:  The patient's family history includes Cancer in his brother and father; Uterine cancer in his mother.  ROS:   Review of Systems  Constitutional: Positive for malaise/fatigue. Negative for chills, diaphoresis, fever and weight loss.  HENT: Negative for congestion.   Eyes: Negative for discharge and redness.  Respiratory: Positive for shortness of breath. Negative for cough, hemoptysis, sputum production and wheezing.   Cardiovascular: Negative for chest pain, palpitations, orthopnea, claudication, leg swelling and PND.  Gastrointestinal: Positive for diarrhea. Negative for abdominal pain, blood in stool,  constipation, heartburn, melena, nausea and vomiting.       Abdominal swelling  Musculoskeletal: Negative for falls and myalgias.  Skin: Negative for rash.  Neurological: Positive for weakness. Negative for dizziness, tingling, tremors, sensory change, speech change, focal weakness and loss of consciousness.  Endo/Heme/Allergies: Does not bruise/bleed easily.  Psychiatric/Behavioral: Negative for substance abuse. The patient is not nervous/anxious.   All other systems reviewed and are negative.    EKGs/Labs/Other Studies Reviewed:    Studies reviewed were summarized above. The additional studies were reviewed today: As above.   EKG:  EKG is ordered today.  The EKG ordered today demonstrates NSR, 88 bpm, early repolarization abnormality, nonspecific ST-T changes, no significant changes when compared to prior tracing  Recent Labs: 05/04/2020: Magnesium 2.3 06/29/2020: ALT 9; Hemoglobin 10.7; Platelets 199 07/15/2020: BUN 86; Creatinine, Ser 2.71; Potassium 3.0; Sodium 142  Recent Lipid Panel No results found for: CHOL, TRIG, HDL, CHOLHDL, VLDL, LDLCALC, LDLDIRECT  PHYSICAL EXAM:    VS:  BP (!) 112/58 (BP Location:  Left Arm, Patient Position: Sitting, Cuff Size: Normal)    Pulse 88    Ht 5' 6"  (1.676 m)    Wt 152 lb 9.6 oz (69.2 kg)    SpO2 97%    BMI 24.63 kg/m   BMI: Body mass index is 24.63 kg/m.  Physical Exam Vitals reviewed.  Constitutional:      Appearance: William Holland is well-developed.     Comments: Chronically ill-appearing  HENT:     Head: Normocephalic and atraumatic.  Eyes:     General:        Right eye: No discharge.        Left eye: No discharge.  Neck:     Vascular: No JVD.  Cardiovascular:     Rate and Rhythm: Normal rate and regular rhythm.     Pulses: No midsystolic click and no opening snap.          Posterior tibial pulses are 2+ on the right side and 2+ on the left side.     Heart sounds: Normal heart sounds, S1 normal and S2 normal. Heart sounds not distant. No murmur heard.  No friction rub.  Pulmonary:     Effort: Pulmonary effort is normal. No respiratory distress.     Breath sounds: Normal breath sounds. No decreased breath sounds, wheezing or rales.  Chest:     Chest wall: No tenderness.  Abdominal:     General: Bowel sounds are normal. There is distension.     Palpations: Abdomen is soft.     Tenderness: There is no abdominal tenderness.  Musculoskeletal:     Cervical back: Normal range of motion.  Skin:    General: Skin is warm and dry.     Nails: There is no clubbing.  Neurological:     Mental Status: William Holland is alert and oriented to person, place, and time.  Psychiatric:        Speech: Speech normal.        Behavior: Behavior normal.        Thought Content: Thought content normal.        Judgment: Judgment normal.     Wt Readings from Last 3 Encounters:  09/01/20 152 lb 9.6 oz (69.2 kg)  07/15/20 143 lb 12.8 oz (65.2 kg)  06/29/20 146 lb 4 oz (66.3 kg)     ASSESSMENT & PLAN:   1. Chronic combined systolic and diastolic CHF secondary to NICM/pulmonary hypertension/anasarca: William Holland has NYHA class III symptoms  with mild anasarca noted on exam today.  His weight  is up 6 pounds today when compared to his visit in 06/2020 though overall William Holland does feel better.  Escalation of evidence-based medical therapy has been significantly limited in the setting of volume loss with chronic watery diarrhea, hypokalemia, and CKD.  Patient's preference is to not pursue further evaluation of his underlying GI issues, thus making further medical management of his cardiomyopathy quite difficult.  When William Holland was last seen in 06/2020 it was recommended William Holland start taking torsemide on an as-needed basis secondary to AKI.  However, William Holland now informs [place] William Holland has been taking torsemide 20 mg twice daily at that time rather than once daily and since these labs were obtained in 06/2020 William Holland is now taking torsemide 20 mg once daily.  I have advised we check a CMP today however patient and brother declined this indicating they will have labs drawn next week for his nephrology appointment and have them faxed to [place].  I have expressed to them this is not my preference.  William Holland will continue torsemide 20 mg daily for now with an extra 20 mg dose on Mondays and Thursdays to augment gentle diuresis in an effort to preserve renal function.  William Holland also now appears to be back on losartan whereas previously when William Holland was seen in 06/2020 William Holland was not on this medication.  Remains unclear when or where this medication was started.  William Holland does not bring his medication bottles for review.  In this setting, this is an overall very difficult situation to manage.  William Holland remains off Entresto and MRA secondary to relative hypotension and underlying CKD.  I recommend his next appointment be with his primary cardiologist.  CHF education discussed in detail.  2. CKD stage II-III: Patient and brother declined labs today.  Follow-up with nephrology as directed later this week with recommendation to fax labs to our office for review.  3. Diverticular stricture/chronic diarrhea: Patient and family have declined further surgical evaluation and are uncertain if  they plan to follow-up with GI as well.  This has been a longstanding issue and has significantly limited the management and escalation of his evidence-based medical therapy for heart failure given the degree of volume depletion/fluid loss through chronic watery diarrhea as well as persistent hypokalemia.  It may be of some benefit for palliative care to be consulted for [place] discussion.  4. Hypokalemia: Patient and brother declined laboratory follow-up today indicating William Holland will have labs drawn next week for his nephrology appointment and have these faxed to our office for review.  Advised them this is not my preference however this is what they would like to do.  In this setting, with his significant comorbid conditions including ongoing volume loss with chronic watery diarrhea, CKD, and hypokalemia medication management is limited.  Disposition: F/u with Dr. Fletcher Anon only in 4 weeks.   Medication Adjustments/Labs and Tests Ordered: Current medicines are reviewed at length with the patient today.  Concerns regarding medicines are outlined above. Medication changes, Labs and Tests ordered today are summarized above and listed in the Patient Instructions accessible in Encounters.   Signed, Christell Faith, PA-C 09/01/2020 2:46 PM     [address] [place], [place] [address] [phone]

## 2020-09-01 ENCOUNTER — Encounter: Payer: Self-pay | Admitting: Physician Assistant

## 2020-09-01 ENCOUNTER — Other Ambulatory Visit: Payer: Self-pay

## 2020-09-01 ENCOUNTER — Ambulatory Visit (INDEPENDENT_AMBULATORY_CARE_PROVIDER_SITE_OTHER): Payer: Medicare Other | Admitting: Physician Assistant

## 2020-09-01 VITALS — BP 112/58 | HR 88 | Ht 66.0 in | Wt 152.6 lb

## 2020-09-01 DIAGNOSIS — I5042 Chronic combined systolic (congestive) and diastolic (congestive) heart failure: Secondary | ICD-10-CM | POA: Diagnosis not present

## 2020-09-01 DIAGNOSIS — R601 Generalized edema: Secondary | ICD-10-CM

## 2020-09-01 DIAGNOSIS — K529 Noninfective gastroenteritis and colitis, unspecified: Secondary | ICD-10-CM

## 2020-09-01 DIAGNOSIS — K56699 Other intestinal obstruction unspecified as to partial versus complete obstruction: Secondary | ICD-10-CM

## 2020-09-01 DIAGNOSIS — E876 Hypokalemia: Secondary | ICD-10-CM

## 2020-09-01 DIAGNOSIS — I2721 Secondary pulmonary arterial hypertension: Secondary | ICD-10-CM | POA: Diagnosis not present

## 2020-09-01 DIAGNOSIS — I428 Other cardiomyopathies: Secondary | ICD-10-CM

## 2020-09-01 DIAGNOSIS — N183 Chronic kidney disease, stage 3 unspecified: Secondary | ICD-10-CM

## 2020-09-01 MED ORDER — TORSEMIDE 20 MG PO TABS: ORAL_TABLET | ORAL | 3 refills | Status: DC

## 2020-09-01 MED ORDER — TORSEMIDE 20 MG PO TABS: ORAL_TABLET | ORAL | 3 refills | Status: AC

## 2020-09-01 NOTE — Patient Instructions (Addendum)
Medication Instructions:  1- Change Torsemide Take 1 tablet (20 mg total) each morning. Take additional dose (20 mg total) on Monday and Thursday afternoons *If you need a refill on your cardiac medications before your next appointment, please call your pharmacy*   Lab Work: 1- please have provider fax lab results next week fax # [phone] If you have labs (blood work) drawn today and your tests are completely normal, you will receive your results only by: Marland Kitchen MyChart Message (if you have MyChart) OR . A paper copy in the mail If you have any lab test that is abnormal or we need to change your treatment, we will call you to review the results.   Testing/Procedures: None ordered   Follow-Up: At [facility], you and your health needs are our priority.  As part of our continuing mission to provide you with exceptional heart care, we have created designated Provider Care Teams.  These Care Teams include your primary Cardiologist (physician) and Advanced Practice Providers (APPs -  Physician Assistants and Nurse Practitioners) who all work together to provide you with the care you need, when you need it.  We recommend signing up for the patient portal called "MyChart".  Sign up information is provided on this After Visit Summary.  MyChart is used to connect with patients for Virtual Visits (Telemedicine).  Patients are able to view lab/test results, encounter notes, upcoming appointments, etc.  Non-urgent messages can be sent to your provider as well.   To learn more about what you can do with MyChart, go to [email or web address].    Your next appointment:   4 week(s)  The format for your next appointment:   In Person  Provider:     You may see [name], MD or Christell Faith, PA-C

## 2020-09-05 ENCOUNTER — Telehealth: Payer: Self-pay

## 2020-09-05 ENCOUNTER — Other Ambulatory Visit
Admission: RE | Admit: 2020-09-05 | Discharge: 2020-09-05 | Disposition: A | Discharge status: Home / Self Care | Payer: Medicare Other | Attending: Physician Assistant | Admitting: Physician Assistant

## 2020-09-05 DIAGNOSIS — I5042 Chronic combined systolic (congestive) and diastolic (congestive) heart failure: Secondary | ICD-10-CM | POA: Insufficient documentation

## 2020-09-05 LAB — BASIC METABOLIC PANEL
Anion gap: 14 (ref 5–15)
BUN: 49 mg/dL — ABNORMAL HIGH (ref 8–23)
CO2: 28 mmol/L (ref 22–32)
Calcium: 9 mg/dL (ref 8.9–10.3)
Chloride: 103 mmol/L (ref 98–111)
Creatinine, Ser: 2.57 mg/dL — ABNORMAL HIGH (ref 0.61–1.24)
GFR calc Af Amer: 28 mL/min — ABNORMAL LOW (ref >60)
GFR calc non Af Amer: 24 mL/min — ABNORMAL LOW (ref >60)
Glucose, Bld: 95 mg/dL (ref 70–99)
Potassium: 3.1 mmol/L — ABNORMAL LOW (ref 3.5–5.1)
Sodium: 145 mmol/L (ref 135–145)

## 2020-09-05 NOTE — Telephone Encounter (Signed)
Call to patient to review labs.    Spoke to brother, [name], he verbalized understanding and has no further questions at this time.    Advised pt to call for any further questions or concerns.  No further orders.

## 2020-09-05 NOTE — Telephone Encounter (Signed)
-----   Message from Rise Mu, PA-C sent at 09/05/2020 11:19 AM EDT ----- Renal function improved and back to his ~ baseline. Potassium remain low.   Recommendations:-Continue torsemide 20 mg daily with a prn 20 mg for worsening SOB, weight gain > 3 pounds overnight or > 5 pounds in a week -Please continue KCl 20 mEq daily and have him take an additional 40 mEq x 1 (for a total of 60 mEq) today

## 2020-10-07 ENCOUNTER — Encounter: Payer: Self-pay | Admitting: Cardiovascular Disease

## 2020-10-07 ENCOUNTER — Other Ambulatory Visit: Payer: Self-pay

## 2020-10-07 ENCOUNTER — Ambulatory Visit: Payer: Medicare Other | Admitting: Cardiovascular Disease

## 2020-10-07 VITALS — BP 110/52 | HR 88 | Ht 66.0 in | Wt 149.2 lb

## 2020-10-07 DIAGNOSIS — N1832 Chronic kidney disease, stage 3b: Secondary | ICD-10-CM | POA: Diagnosis not present

## 2020-10-07 DIAGNOSIS — I5022 Chronic systolic (congestive) heart failure: Secondary | ICD-10-CM | POA: Diagnosis not present

## 2020-10-07 NOTE — Progress Notes (Signed)
Cardiology Office Note   Date:  10/07/2020   ID:  [name], DOB [birth year], MRN [id number]  PCP:  Patient, No Pcp Per  Cardiologist:   [name], MD   Chief Complaint  Patient presents with  . Follow-up    4 week follow up. Meds reviewed by the pt.'s brother verbally. "doing well."       History of Present Illness: William Holland is a 74 y.o. male who presents for a follow-up visit regarding chronic systolic heart failure due to nonischemic cardiomyopathy.  He has history of chronic diarrhea, intellectual slowing with memory loss, chronic kidney disease, diverticulitis and pulmonary hypertension. He was hospitalized in January 2020 with prolonged symptoms of watery diarrhea.  Echocardiogram showed an EF of 30 to 35%.  He was thought to have bowel perforation but was treated conservatively.  Cardiac catheterization in February 2020 showed minimal nonobstructive coronary artery disease.  Treatment with a diuretic has been difficult given intermittent diarrhea leading to worsening renal function.  He follows with nephrology and his creatinine has been around 2.5. The patient is not able to take care of himself and lives with his nephew.  He is accompanied today by his brother who is also one of my patients. The patient reports stable exertional dyspnea no orthopnea.  Leg edema improved after torsemide was increased to 20 mg.  He reports that this was increased by his nephrologist.  No chest pain.   Past Medical History:  Diagnosis Date  . Anemia   . Bowel perforation ([place])   . Chronic combined systolic (congestive) and diastolic (congestive) heart failure (HCC)    a. 12/2018 Echo: EF of 30 to 35%, diffuse hypokinesis, grade 2 diastolic dysfunction, mild mitral regurgitation, moderately dilated left atrium, moderately reduced RV systolic function, trivial pericardial effusion was identified, along with a right pleural effusion  . Chronic diarrhea   . Diverticulosis, sigmoid 02/27/2018    Colonoscopy done on 02/27/2018 by Dr. Marius Ditch  . NICM (nonischemic cardiomyopathy) ([place])    a. 12/2018 Echo: Ef 30-35%, diff HK, Gr2 DD.  Marland Kitchen Nonobstructive CAD (coronary artery disease)    a. 01/2019 Cath: LM nl, LAD nl, LCX nl, RCA min irregs.  . Stuttering during school years     Past Surgical History:  Procedure Laterality Date  . APPENDECTOMY    . COLONOSCOPY WITH PROPOFOL N/A 02/27/2018   Procedure: COLONOSCOPY WITH PROPOFOL;  Surgeon: Lin Landsman, MD;  Location: [facility] ENDOSCOPY;  Service: Gastroenterology;  Laterality: N/A;  . EYE SURGERY    . RIGHT/LEFT HEART CATH AND CORONARY ANGIOGRAPHY N/A 02/09/2019   Procedure: RIGHT/LEFT HEART CATH AND CORONARY ANGIOGRAPHY;  Surgeon: Wellington Hampshire, MD;  Location: [facility] CV LAB;  Service: Cardiovascular;  Laterality: N/A;     Current Outpatient Medications  Medication Sig Dispense Refill  . levothyroxine (SYNTHROID, LEVOTHROID) 50 MCG tablet Take 1 tablet (50 mcg total) by mouth daily before breakfast. 30 tablet 2  . losartan (COZAAR) 25 MG tablet Take 12.5 mg by mouth daily.    . metoprolol succinate (TOPROL XL) 25 MG 24 hr tablet Take 0.5 tablets (12.5 mg total) by mouth daily. 30 tablet 5  . potassium chloride (KLOR-CON) 10 MEQ tablet Take 2 tablets (20 mEq total) by mouth daily as needed (when taking fluid pill). 180 tablet 3  . torsemide (DEMADEX) 20 MG tablet Take 1 tablet (20 mg total) each morning. Take additional dose (20 mg total) on Monday and Thursday afternoons 90 tablet 3  .  furosemide (LASIX) 20 MG tablet Take by mouth. (Patient not taking: Reported on 10/07/2020)     No current facility-administered medications for this visit.    Allergies:   Patient has no known allergies.    Social History:  The patient  reports that he has never smoked. He has never used smokeless tobacco. He reports that he does not drink alcohol and does not use drugs.   Family History:  The patient's family history includes Cancer in  his brother and father; Uterine cancer in his mother.    ROS:  Please see the history of present illness.   Otherwise, review of systems are positive for none.   All other systems are reviewed and negative.    PHYSICAL EXAM: VS:  BP (!) 110/52 (BP Location: Left Arm, Patient Position: Sitting, Cuff Size: Normal)   Pulse 88   Ht 5' 6"  (1.676 m)   Wt 149 lb 4 oz (67.7 kg)   SpO2 93%   BMI 24.09 kg/m  , BMI Body mass index is 24.09 kg/m. GEN: Well nourished, well developed, in no acute distress  HEENT: normal  Neck: no JVD, carotid bruits, or masses Cardiac: RRR; no murmurs, rubs, or gallops,no edema  Respiratory:  clear to auscultation bilaterally, normal work of breathing GI: soft, nontender, nondistended, + BS MS: no deformity or atrophy  Skin: warm and dry, no rash Neuro:  Strength and sensation are intact Psych: euthymic mood, full affect   EKG:  EKG is not ordered today.    Recent Labs: 05/04/2020: Magnesium 2.3 06/29/2020: ALT 9; Hemoglobin 10.7; Platelets 199 09/05/2020: BUN 49; Creatinine, Ser 2.57; Potassium 3.1; Sodium 145    Lipid Panel No results found for: CHOL, TRIG, HDL, CHOLHDL, VLDL, LDLCALC, LDLDIRECT    Wt Readings from Last 3 Encounters:  10/07/20 149 lb 4 oz (67.7 kg)  09/01/20 152 lb 9.6 oz (69.2 kg)  07/15/20 143 lb 12.8 oz (65.2 kg)       No flowsheet data found.    ASSESSMENT AND PLAN:  1.  Chronic systolic heart failure due to nonischemic cardiomyopathy: He appears to be euvolemic on current dose of torsemide.  Continue treatment with Toprol and losartan.  I am hesitant to increase losartan any further given underlying chronic kidney disease and creatinine of 2.5.  Avoid spironolactone for the same reason.  2.  Chronic kidney disease: Most recent creatinine was 2.57 and he is followed by nephrology.  3.  Chronic diarrhea with diverticular stricture: Being treated conservatively.   Disposition:   FU with me in 4  months  Signed,  [name], MD  10/07/2020 10:53 AM    [name]

## 2020-10-07 NOTE — Patient Instructions (Signed)
Medication Instructions: Your physician recommends that you continue on your current medications as directed. Please refer to the Current Medication list given to you today.  *If you need a refill on your cardiac medications before your next appointment, please call your pharmacy*   Lab Work:  NONE   If you have labs (blood work) drawn today and your tests are completely normal, you will receive your results only by: Marland Kitchen MyChart Message (if you have MyChart) OR . A paper copy in the mail If you have any lab test that is abnormal or we need to change your treatment, we will call you to review the results.   Testing/Procedures:  NONE    Follow-Up: At [facility], you and your health needs are our priority.  As part of our continuing mission to provide you with exceptional heart care, we have created designated Provider Care Teams.  These Care Teams include your primary Cardiologist (physician) and Advanced Practice Providers (APPs -  Physician Assistants and Nurse Practitioners) who all work together to provide you with the care you need, when you need it.  We recommend signing up for the patient portal called "MyChart".  Sign up information is provided on this After Visit Summary.  MyChart is used to connect with patients for Virtual Visits (Telemedicine).  Patients are able to view lab/test results, encounter notes, upcoming appointments, etc.  Non-urgent messages can be sent to your provider as well.   To learn more about what you can do with MyChart, go to [email or web address].    Your next appointment:   4 month(s)  The format for your next appointment:   In Person  Provider:   You may see [name], MD or one of the following Advanced Practice Providers on your designated Care Team:    Murray Hodgkins, NP  Christell Faith, PA-C  Marrianne Mood, PA-C  [place], [place]

## 2020-12-20 ENCOUNTER — Ambulatory Visit: Payer: Medicare Other | Admitting: Urology

## 2021-01-11 ENCOUNTER — Other Ambulatory Visit: Payer: Self-pay | Admitting: Family

## 2021-01-11 ENCOUNTER — Other Ambulatory Visit: Payer: Self-pay

## 2021-01-11 MED ORDER — METOPROLOL SUCCINATE ER 25 MG PO TB24
12.5000 mg | ORAL_TABLET | Freq: Every day | ORAL | 0 refills | Status: DC
Start: 2021-01-11 — End: 2021-01-18

## 2021-01-18 ENCOUNTER — Other Ambulatory Visit: Payer: Self-pay

## 2021-01-18 MED ORDER — METOPROLOL SUCCINATE ER 25 MG PO TB24
12.5000 mg | ORAL_TABLET | Freq: Every day | ORAL | 3 refills | Status: DC
Start: 2021-01-18 — End: 2021-01-23

## 2021-01-23 ENCOUNTER — Telehealth: Payer: Self-pay | Admitting: Cardiovascular Disease

## 2021-01-23 MED ORDER — METOPROLOL SUCCINATE ER 25 MG PO TB24: 12.5000 mg | ORAL_TABLET | Freq: Every day | ORAL | 3 refills | Status: AC

## 2021-01-23 NOTE — Telephone Encounter (Signed)
Received fax from [facility] on [address] requesting refills for Metoprolol Succinate 25 mg. Rx request sent to pharmacy.

## 2021-01-26 ENCOUNTER — Telehealth: Payer: Self-pay | Admitting: Nurse Practitioner

## 2021-01-26 ENCOUNTER — Telehealth: Payer: Self-pay | Admitting: Primary Care

## 2021-01-26 NOTE — Telephone Encounter (Signed)
Spoke with brother, [name], to schedule a Palliative Consult and he requested that I call him back around 3 PM today due to he wasn't home at the time of the call.  Will f/u with him later today.

## 2021-01-26 NOTE — Telephone Encounter (Signed)
Spoke with brother, [name], and he requested that I talk with his wife Rocky Crafts to schedule the Palliative Consult.  Spoke with her and discussed Palliative referral/services and we have scheduled an In-person Consult for 01/31/21 @ 8:30 AM

## 2021-01-31 ENCOUNTER — Other Ambulatory Visit: Payer: Self-pay

## 2021-01-31 ENCOUNTER — Other Ambulatory Visit: Payer: Medicare Other | Admitting: Nurse Practitioner

## 2021-02-04 NOTE — Progress Notes (Deleted)
Cardiology Office Note    Date:  02/04/2021   ID:  William Holland, DOB 1946-08-01, MRN [id number]  PCP:  Patient, No Pcp Per  Cardiologist:  [name], MD  Electrophysiologist:  None   Chief Complaint: Follow up  History of Present Illness:   William Holland is a 75 y.o. male with history of chronic combined systolic and diastolic CHF secondary toNICMdiagnosed in 12/2018, pulmonary hypertension, CKD stage II-III, diverticulitis, chronic diarrhea, anemia, and intellectual slowing who presents for follow-up ofhis cardiomyopathy.  He was admitted to the hospital in 12/2018 with a 63-monthhistory of watery diarrhea with 4-10 loose stools per day. He was found to have prominent colon distention with CT of the abdomen and pelvis showing free air suggestive of bowel perforation. However, he was hemodynamically stable. He was significantly volume overloaded. Echo showed an EF of 30 to 35% with diffuse hypokinesis, grade 2 diastolic dysfunction, mild mitral vegetation, moderately dilated left atrium, moderately reduced RV systolic function. He was evaluated by surgery regarding his bowel perforation with repeat CT showing a small amount of intraperitoneal air which was stable without contrast extravasation. In this setting, conservative management was recommended. With regards to his cardiomyopathy, he underwent diagnostic cardiac cath in 01/2019 which showed minimal nonobstructive RCA disease and otherwise normal coronary arteries.  Treatment with diuretic therapy and optimization of GDMT has been difficult with relative hypotension in the setting of longstanding diarrhea leading to worsening renal function and persistent hypokalemia for which he is followed by nephrology with a baseline creatinine around 2.5.  With regards to his diarrhea he has been evaluated by both GI and surgery and has deferred further work-up/treatment.  Echo in 03/2020, to evaluate his EF on maximally tolerated GDMT, showed an  unchanged LVSF with an EF of30-35%, global HK, moderately to severely dilated LV cavity, moderately elevated PASP, moderate MR, and mild to moderate TR.he was last seen in the office in 09/2020 and reported stable exertional dyspnea without orthopnea.  He noted improvement in his lower extremity edema following titration of torsemide to 20 mg.  His weight was down 3 pounds when compared to his visit in 08/2020.  He was felt to be euvolemic and continued on Toprol, losartan, and torsemide.  Underlying CKD precluded escalation of GDMT.  ***   Labs independently reviewed: 08/2020 - potassium 3.1, BUN 49, serum creatinine 2.57 06/2020 - albumin 4.3, AST/ALT normal, HGB 10.7, PLT 199 12/2018-magnesium 2.0, TSH elevated at 6.424, free T4 low at 0.78, free T3 low at 1.3, A1c5.2   Past Medical History:  Diagnosis Date  . Anemia   . Bowel perforation ([place]   . Chronic combined systolic (congestive) and diastolic (congestive) heart failure (HCC)    a. 12/2018 Echo: EF of 30 to 35%, diffuse hypokinesis, grade 2 diastolic dysfunction, mild mitral regurgitation, moderately dilated left atrium, moderately reduced RV systolic function, trivial pericardial effusion was identified, along with a right pleural effusion  . Chronic diarrhea   . Diverticulosis, sigmoid 02/27/2018   Colonoscopy done on 02/27/2018 by Dr. VMarius Ditch . NICM (nonischemic cardiomyopathy) ([place]    a. 12/2018 Echo: Ef 30-35%, diff HK, Gr2 DD.  .Marland KitchenNonobstructive CAD (coronary artery disease)    a. 01/2019 Cath: LM nl, LAD nl, LCX nl, RCA min irregs.  . Stuttering during school years     Past Surgical History:  Procedure Laterality Date  . APPENDECTOMY    . COLONOSCOPY WITH PROPOFOL N/A 02/27/2018   Procedure: COLONOSCOPY WITH PROPOFOL;  Surgeon:  Lin Landsman, MD;  Location: [facility] ENDOSCOPY;  Service: Gastroenterology;  Laterality: N/A;  . EYE SURGERY    . RIGHT/LEFT HEART CATH AND CORONARY ANGIOGRAPHY N/A 02/09/2019   Procedure:  RIGHT/LEFT HEART CATH AND CORONARY ANGIOGRAPHY;  Surgeon: Wellington Hampshire, MD;  Location: [facility] CV LAB;  Service: Cardiovascular;  Laterality: N/A;    Current Medications: No outpatient medications have been marked as taking for the 02/08/21 encounter (Appointment) with Rise Mu, PA-C.    Allergies:   Patient has no known allergies.   Social History   Socioeconomic History  . Marital status: Single    Spouse name: Not on file  . Number of children: Not on file  . Years of education: Not on file  . Highest education level: Not on file  Occupational History  . Occupation: retired  Tobacco Use  . Smoking status: Never Smoker  . Smokeless tobacco: Never Used  Vaping Use  . Vaping Use: Never used  Substance and Sexual Activity  . Alcohol use: No    Alcohol/week: 0.0 standard drinks  . Drug use: No  . Sexual activity: Not on file    Comment: Not Asked  Other Topics Concern  . Not on file  Social History Narrative   Lives at home with his nephew.  Unsteady gait.   Social Determinants of Health   Financial Resource Strain: Not on file  Food Insecurity: Not on file  Transportation Needs: Not on file  Physical Activity: Not on file  Stress: Not on file  Social Connections: Not on file     Family History:  The patient's family history includes Cancer in his brother and father; Uterine cancer in his mother.  ROS:   ROS   EKGs/Labs/Other Studies Reviewed:    Studies reviewed were summarized above. The additional studies were reviewed today: As above.   EKG:  EKG is ordered today.  The EKG ordered today demonstrates ***  Recent Labs: 05/04/2020: Magnesium 2.3 06/29/2020: ALT 9; Hemoglobin 10.7; Platelets 199 09/05/2020: BUN 49; Creatinine, Ser 2.57; Potassium 3.1; Sodium 145  Recent Lipid Panel No results found for: CHOL, TRIG, HDL, CHOLHDL, VLDL, LDLCALC, LDLDIRECT  PHYSICAL EXAM:    VS:  There were no vitals taken for this visit.  BMI: There is no  height or weight on file to calculate BMI.  Physical Exam  Wt Readings from Last 3 Encounters:  10/07/20 149 lb 4 oz (67.7 kg)  09/01/20 152 lb 9.6 oz (69.2 kg)  07/15/20 143 lb 12.8 oz (65.2 kg)     ASSESSMENT & PLAN:   1. HFrEF secondary to NICM:  2. CKD stage III:  3. Chronic diarrhea with diverticular stricture:  4. Hypokalemia:  Disposition: F/u with Dr. Fletcher Anon or an APP in ***.   Medication Adjustments/Labs and Tests Ordered: Current medicines are reviewed at length with the patient today.  Concerns regarding medicines are outlined above. Medication changes, Labs and Tests ordered today are summarized above and listed in the Patient Instructions accessible in Encounters.   Signed, Christell Faith, PA-C 02/04/2021 10:00 AM     [address] [place], [place] [address] [phone]

## 2021-02-07 ENCOUNTER — Inpatient Hospital Stay
Admission: EM | Admit: 2021-02-07 | Discharge: 2021-02-21 | DRG: 871 | Disposition: E | Payer: Medicare Other | Attending: Internal Medicine | Admitting: Internal Medicine

## 2021-02-07 ENCOUNTER — Emergency Department: Payer: Medicare Other

## 2021-02-07 ENCOUNTER — Other Ambulatory Visit: Payer: Self-pay

## 2021-02-07 DIAGNOSIS — Z66 Do not resuscitate: Secondary | ICD-10-CM | POA: Diagnosis not present

## 2021-02-07 DIAGNOSIS — N3090 Cystitis, unspecified without hematuria: Secondary | ICD-10-CM | POA: Diagnosis present

## 2021-02-07 DIAGNOSIS — N2581 Secondary hyperparathyroidism of renal origin: Secondary | ICD-10-CM | POA: Diagnosis present

## 2021-02-07 DIAGNOSIS — R6521 Severe sepsis with septic shock: Secondary | ICD-10-CM | POA: Diagnosis present

## 2021-02-07 DIAGNOSIS — E875 Hyperkalemia: Secondary | ICD-10-CM | POA: Diagnosis present

## 2021-02-07 DIAGNOSIS — D631 Anemia in chronic kidney disease: Secondary | ICD-10-CM | POA: Diagnosis present

## 2021-02-07 DIAGNOSIS — A419 Sepsis, unspecified organism: Principal | ICD-10-CM | POA: Diagnosis present

## 2021-02-07 DIAGNOSIS — Z79899 Other long term (current) drug therapy: Secondary | ICD-10-CM

## 2021-02-07 DIAGNOSIS — I2510 Atherosclerotic heart disease of native coronary artery without angina pectoris: Secondary | ICD-10-CM | POA: Diagnosis present

## 2021-02-07 DIAGNOSIS — N170 Acute kidney failure with tubular necrosis: Secondary | ICD-10-CM | POA: Diagnosis present

## 2021-02-07 DIAGNOSIS — E874 Mixed disorder of acid-base balance: Secondary | ICD-10-CM | POA: Diagnosis present

## 2021-02-07 DIAGNOSIS — Z20822 Contact with and (suspected) exposure to covid-19: Secondary | ICD-10-CM | POA: Diagnosis present

## 2021-02-07 DIAGNOSIS — K56600 Partial intestinal obstruction, unspecified as to cause: Secondary | ICD-10-CM | POA: Diagnosis present

## 2021-02-07 DIAGNOSIS — N136 Pyonephrosis: Secondary | ICD-10-CM | POA: Diagnosis present

## 2021-02-07 DIAGNOSIS — K5730 Diverticulosis of large intestine without perforation or abscess without bleeding: Secondary | ICD-10-CM | POA: Diagnosis present

## 2021-02-07 DIAGNOSIS — R0682 Tachypnea, not elsewhere classified: Secondary | ICD-10-CM

## 2021-02-07 DIAGNOSIS — N1330 Unspecified hydronephrosis: Secondary | ICD-10-CM | POA: Diagnosis present

## 2021-02-07 DIAGNOSIS — I5042 Chronic combined systolic (congestive) and diastolic (congestive) heart failure: Secondary | ICD-10-CM | POA: Diagnosis present

## 2021-02-07 DIAGNOSIS — R609 Edema, unspecified: Secondary | ICD-10-CM

## 2021-02-07 DIAGNOSIS — I5043 Acute on chronic combined systolic (congestive) and diastolic (congestive) heart failure: Secondary | ICD-10-CM | POA: Diagnosis not present

## 2021-02-07 DIAGNOSIS — I428 Other cardiomyopathies: Secondary | ICD-10-CM | POA: Diagnosis present

## 2021-02-07 DIAGNOSIS — R6250 Unspecified lack of expected normal physiological development in childhood: Secondary | ICD-10-CM | POA: Diagnosis present

## 2021-02-07 DIAGNOSIS — K56699 Other intestinal obstruction unspecified as to partial versus complete obstruction: Secondary | ICD-10-CM

## 2021-02-07 DIAGNOSIS — Z79890 Hormone replacement therapy (postmenopausal): Secondary | ICD-10-CM

## 2021-02-07 DIAGNOSIS — N179 Acute kidney failure, unspecified: Secondary | ICD-10-CM

## 2021-02-07 DIAGNOSIS — D509 Iron deficiency anemia, unspecified: Secondary | ICD-10-CM | POA: Diagnosis present

## 2021-02-07 DIAGNOSIS — R9389 Abnormal findings on diagnostic imaging of other specified body structures: Secondary | ICD-10-CM

## 2021-02-07 DIAGNOSIS — R778 Other specified abnormalities of plasma proteins: Secondary | ICD-10-CM | POA: Diagnosis present

## 2021-02-07 DIAGNOSIS — Z0189 Encounter for other specified special examinations: Secondary | ICD-10-CM

## 2021-02-07 DIAGNOSIS — I130 Hypertensive heart and chronic kidney disease with heart failure and stage 1 through stage 4 chronic kidney disease, or unspecified chronic kidney disease: Secondary | ICD-10-CM | POA: Diagnosis present

## 2021-02-07 DIAGNOSIS — K529 Noninfective gastroenteritis and colitis, unspecified: Secondary | ICD-10-CM | POA: Diagnosis present

## 2021-02-07 DIAGNOSIS — N184 Chronic kidney disease, stage 4 (severe): Secondary | ICD-10-CM | POA: Diagnosis present

## 2021-02-07 DIAGNOSIS — Z7189 Other specified counseling: Secondary | ICD-10-CM | POA: Diagnosis not present

## 2021-02-07 DIAGNOSIS — E876 Hypokalemia: Secondary | ICD-10-CM | POA: Diagnosis not present

## 2021-02-07 DIAGNOSIS — R52 Pain, unspecified: Secondary | ICD-10-CM

## 2021-02-07 DIAGNOSIS — E039 Hypothyroidism, unspecified: Secondary | ICD-10-CM | POA: Diagnosis present

## 2021-02-07 DIAGNOSIS — Z515 Encounter for palliative care: Secondary | ICD-10-CM | POA: Diagnosis not present

## 2021-02-07 DIAGNOSIS — R55 Syncope and collapse: Secondary | ICD-10-CM | POA: Diagnosis present

## 2021-02-07 DIAGNOSIS — N390 Urinary tract infection, site not specified: Secondary | ICD-10-CM | POA: Diagnosis present

## 2021-02-07 DIAGNOSIS — R4182 Altered mental status, unspecified: Secondary | ICD-10-CM | POA: Diagnosis present

## 2021-02-07 DIAGNOSIS — N189 Chronic kidney disease, unspecified: Secondary | ICD-10-CM | POA: Diagnosis present

## 2021-02-07 LAB — LACTIC ACID, PLASMA
Lactic Acid, Venous: 1.1 mmol/L (ref 0.5–1.9)
Lactic Acid, Venous: 0.7 mmol/L (ref 0.5–1.9)

## 2021-02-07 LAB — BASIC METABOLIC PANEL
Anion gap: 11 (ref 5–15)
BUN: 110 mg/dL — ABNORMAL HIGH (ref 8–23)
CO2: 14 mmol/L — ABNORMAL LOW (ref 22–32)
Calcium: 8.8 mg/dL — ABNORMAL LOW (ref 8.9–10.3)
Chloride: 115 mmol/L — ABNORMAL HIGH (ref 98–111)
Creatinine, Ser: 4.92 mg/dL — ABNORMAL HIGH (ref 0.61–1.24)
GFR, Estimated: 12 mL/min — ABNORMAL LOW (ref >60)
Glucose, Bld: 107 mg/dL — ABNORMAL HIGH (ref 70–99)
Potassium: 5.6 mmol/L — ABNORMAL HIGH (ref 3.5–5.1)
Sodium: 140 mmol/L (ref 135–145)

## 2021-02-07 LAB — CBC
HCT: 34.5 % — ABNORMAL LOW (ref 39.0–52.0)
Hemoglobin: 10.6 g/dL — ABNORMAL LOW (ref 13.0–17.0)
MCH: 29 pg (ref 26.0–34.0)
MCHC: 30.7 g/dL (ref 30.0–36.0)
MCV: 94.3 fL (ref 80.0–100.0)
Platelets: 269 10*3/uL (ref 150–400)
RBC: 3.66 MIL/uL — ABNORMAL LOW (ref 4.22–5.81)
RDW: 14.6 % (ref 11.5–15.5)
WBC: 25.1 10*3/uL — ABNORMAL HIGH (ref 4.0–10.5)
nRBC: 0 % (ref 0.0–0.2)

## 2021-02-07 LAB — URINALYSIS, COMPLETE (UACMP) WITH MICROSCOPIC
Bilirubin Urine: NEGATIVE
Glucose, UA: NEGATIVE mg/dL
Ketones, ur: NEGATIVE mg/dL
Nitrite: NEGATIVE
Protein, ur: 100 mg/dL — AB
Specific Gravity, Urine: 1.01 (ref 1.005–1.030)
WBC, UA: >50 WBC/hpf — ABNORMAL HIGH (ref 0–5)
pH: 7 (ref 5.0–8.0)

## 2021-02-07 LAB — BRAIN NATRIURETIC PEPTIDE: B Natriuretic Peptide: 128.4 pg/mL — ABNORMAL HIGH (ref 0.0–100.0)

## 2021-02-07 LAB — PROTEIN / CREATININE RATIO, URINE
Creatinine, Urine: 40 mg/dL
Protein Creatinine Ratio: 3.68 mg/mg{creat} — ABNORMAL HIGH (ref 0.00–0.15)
Total Protein, Urine: 147 mg/dL

## 2021-02-07 LAB — SARS CORONAVIRUS 2 (TAT 6-24 HRS): SARS Coronavirus 2: NEGATIVE

## 2021-02-07 LAB — PROCALCITONIN: Procalcitonin: 1.87 ng/mL

## 2021-02-07 LAB — POTASSIUM
Potassium: 5.4 mmol/L — ABNORMAL HIGH (ref 3.5–5.1)
Potassium: 5 mmol/L (ref 3.5–5.1)

## 2021-02-07 LAB — TROPONIN I (HIGH SENSITIVITY)
Troponin I (High Sensitivity): 18 ng/L — ABNORMAL HIGH (ref <18)
Troponin I (High Sensitivity): 20 ng/L — ABNORMAL HIGH (ref <18)

## 2021-02-07 MED ORDER — LACTATED RINGERS IV BOLUS (SEPSIS)
250.0000 mL | Freq: Once | INTRAVENOUS | Status: AC
  Administered 2021-02-07: 250 mL via INTRAVENOUS

## 2021-02-07 MED ORDER — VANCOMYCIN HCL 1250 MG/250ML IV SOLN
1250.0000 mg | Freq: Once | INTRAVENOUS | Status: AC
  Administered 2021-02-07: 1250 mg via INTRAVENOUS
  Filled 2021-02-07: qty 250

## 2021-02-07 MED ORDER — SODIUM CHLORIDE 0.9 % IV SOLN
2.0000 g | Freq: Once | INTRAVENOUS | Status: AC
  Administered 2021-02-07: 2 g via INTRAVENOUS
  Filled 2021-02-07: qty 2

## 2021-02-07 MED ORDER — METRONIDAZOLE IN NACL 5-0.79 MG/ML-% IV SOLN
500.0000 mg | Freq: Three times a day (TID) | INTRAVENOUS | Status: DC
  Administered 2021-02-07 – 2021-02-09 (×5): 500 mg via INTRAVENOUS
  Filled 2021-02-07 (×8): qty 100

## 2021-02-07 MED ORDER — SODIUM CHLORIDE 0.9 % IV SOLN
250.0000 mL | INTRAVENOUS | Status: DC
  Administered 2021-02-07 – 2021-02-15 (×2): 250 mL via INTRAVENOUS

## 2021-02-07 MED ORDER — POLYETHYLENE GLYCOL 3350 17 G PO PACK: 17.0000 g | PACK | Freq: Every day | ORAL | Status: DC | PRN

## 2021-02-07 MED ORDER — VANCOMYCIN HCL IN DEXTROSE 1-5 GM/200ML-% IV SOLN: 1000.0000 mg | Freq: Once | INTRAVENOUS | Status: DC

## 2021-02-07 MED ORDER — DOCUSATE SODIUM 100 MG PO CAPS: 100.0000 mg | ORAL_CAPSULE | Freq: Two times a day (BID) | ORAL | Status: DC | PRN

## 2021-02-07 MED ORDER — SODIUM CHLORIDE 0.9 % IV SOLN
2.0000 g | INTRAVENOUS | Status: DC
  Administered 2021-02-08: 2 g via INTRAVENOUS
  Filled 2021-02-07 (×2): qty 2

## 2021-02-07 MED ORDER — HEPARIN SODIUM (PORCINE) 5000 UNIT/ML IJ SOLN
5000.0000 [IU] | Freq: Three times a day (TID) | INTRAMUSCULAR | Status: DC
  Administered 2021-02-07 – 2021-02-17 (×30): 5000 [IU] via SUBCUTANEOUS
  Filled 2021-02-07 (×31): qty 1

## 2021-02-07 MED ORDER — LACTATED RINGERS IV BOLUS (SEPSIS)
1000.0000 mL | Freq: Once | INTRAVENOUS | Status: AC
  Administered 2021-02-07: 1000 mL via INTRAVENOUS

## 2021-02-07 MED ORDER — SODIUM BICARBONATE 8.4 % IV SOLN
INTRAVENOUS | Status: DC
  Filled 2021-02-07 (×6): qty 850
  Filled 2021-02-07: qty 150
  Filled 2021-02-07: qty 850
  Filled 2021-02-07: qty 150
  Filled 2021-02-07 (×2): qty 850

## 2021-02-07 MED ORDER — METRONIDAZOLE IN NACL 5-0.79 MG/ML-% IV SOLN
500.0000 mg | Freq: Once | INTRAVENOUS | Status: AC
  Administered 2021-02-07: 500 mg via INTRAVENOUS
  Filled 2021-02-07: qty 100

## 2021-02-07 MED ORDER — NOREPINEPHRINE 4 MG/250ML-% IV SOLN
2.0000 ug/min | INTRAVENOUS | Status: DC
  Administered 2021-02-07: 14:00:00 2 ug/min via INTRAVENOUS
  Administered 2021-02-08: 7.013 ug/min via INTRAVENOUS
  Administered 2021-02-08: 2 ug/min via INTRAVENOUS
  Administered 2021-02-09: 3 ug/min via INTRAVENOUS
  Administered 2021-02-10: 19:00:00 2 ug/min via INTRAVENOUS
  Administered 2021-02-11: 5 ug/min via INTRAVENOUS
  Administered 2021-02-12: 3 ug/min via INTRAVENOUS
  Administered 2021-02-12: 6 ug/min via INTRAVENOUS
  Administered 2021-02-13: 17:00:00 4 ug/min via INTRAVENOUS
  Administered 2021-02-15: 2 ug/min via INTRAVENOUS
  Filled 2021-02-07 (×13): qty 250

## 2021-02-07 NOTE — Consult Note (Signed)
PHARMACY -  BRIEF ANTIBIOTIC NOTE   Pharmacy has received consult(s) for Cefepime/Vancomycin from an ED provider.  The patient's profile has been reviewed for ht/wt/allergies/indication/available labs.    One time order(s) placed for Vancomycin 1256m IV x 1, Cefepime 2g IV x 1  Further antibiotics/pharmacy consults should be ordered by admitting physician if indicated.                       Thank you,  CLu Duffel PharmD, BCPS Clinical Pharmacist 02/06/2021 10:13 AM

## 2021-02-07 NOTE — Consult Note (Signed)
CODE SEPSIS - PHARMACY COMMUNICATION  **Broad Spectrum Antibiotics should be administered within 1 hour of Sepsis diagnosis**  Time Code Sepsis Called/Page Received: 1012  Antibiotics Ordered: Vancomycin/Cefepime  Time of 1st antibiotic administration: 1239  Additional action taken by pharmacy: none  If necessary, Name of Provider/Nurse Contacted: [place] ,PharmD Clinical Pharmacist  01/27/2021  10:14 AM

## 2021-02-07 NOTE — ED Triage Notes (Signed)
Pt comes pov with brother after having a near syncopal episode. Brother states pt "almost fainted" and was "unresponsive". Pt oriented to self and place. Pt lives with nephew. Pt states "not feeling too well".

## 2021-02-07 NOTE — Consult Note (Signed)
[facility]  CONSULT NOTE    Date: 02/06/2021                  Patient Name:  William Holland  MRN: [id number]  DOB: 04-03-1946  Age / Sex: 75 y.o., male         PCP: Patient, No Pcp Per                 Service Requesting Consult: Dr. Mortimer Fries                 Reason for Consult: Acute kidney injury            History of Present Illness: [name] admitted to [facility] with abdominal pain and near syncope. Patient thought to have sepsis.  Brother at bedside who assists with history taking.  Patient has been feeling weak and tired but denies any fevers, chills, shortness of breath.   Medications: Outpatient medications: (Not in a hospital admission)   Current medications: Current Facility-Administered Medications  Medication Dose Route Frequency Provider Last Rate Last Admin  . 0.9 %  sodium chloride infusion  250 mL Intravenous Continuous Rust-Chester, Huel Cote, NP      . [START ON 02/08/2021] ceFEPIme (MAXIPIME) 2 g in sodium chloride 0.9 % 100 mL IVPB  2 g Intravenous Q24H Lu Duffel, RPH      . docusate sodium (COLACE) capsule 100 mg  100 mg Oral BID PRN Rust-Chester, Britton L, NP      . heparin injection 5,000 Units  5,000 Units Subcutaneous Q8H Rust-Chester, Britton L, NP   5,000 Units at 01/31/2021 1419  . norepinephrine (LEVOPHED) 35m in 2561mpremix infusion  2-10 mcg/min Intravenous Titrated Rust-Chester, Britton L, NP 7.5 mL/hr at 02/06/2021 1421 2 mcg/min at 02/13/2021 1421  . polyethylene glycol (MIRALAX / GLYCOLAX) packet 17 g  17 g Oral Daily PRN Rust-Chester, BrToribio Harbour, NP      . vancomycin (VANCOREADY) IVPB 1250 mg/250 mL  1,250 mg Intravenous Once ShLu DuffelRPH 166.7 mL/hr at 02/17/2021 1346 1,250 mg at 02/06/2021 1346   Current Outpatient Medications  Medication Sig Dispense Refill  . losartan (COZAAR) 25 MG tablet Take 12.5 mg by mouth daily.    . metoprolol succinate (TOPROL XL) 25 MG 24 hr tablet Take 0.5 tablets (12.5 mg total)  by mouth daily. 45 tablet 3  . potassium chloride (KLOR-CON) 10 MEQ tablet Take 2 tablets (20 mEq total) by mouth daily as needed (when taking fluid pill). (Patient taking differently: Take 20 mEq by mouth daily.) 180 tablet 3  . torsemide (DEMADEX) 20 MG tablet Take 1 tablet (20 mg total) each morning. Take additional dose (20 mg total) on Monday and Thursday afternoons (Patient taking differently: Take 20 mg by mouth daily.) 90 tablet 3      Allergies: No Known Allergies    Past Medical History: Past Medical History:  Diagnosis Date  . Anemia   . Bowel perforation ([facility]  . Chronic combined systolic (congestive) and diastolic (congestive) heart failure (HCC)    a. 12/2018 Echo: EF of 30 to 35%, diffuse hypokinesis, grade 2 diastolic dysfunction, mild mitral regurgitation, moderately dilated left atrium, moderately reduced RV systolic function, trivial pericardial effusion was identified, along with a right pleural effusion  . Chronic diarrhea   . Diverticulosis, sigmoid 02/27/2018   Colonoscopy done on 02/27/2018 by Dr. VaMarius Ditch. NICM (nonischemic cardiomyopathy) ([facility]   a. 12/2018 Echo: Ef 30-35%, diff  HK, Gr2 DD.  Marland Kitchen Nonobstructive CAD (coronary artery disease)    a. 01/2019 Cath: LM nl, LAD nl, LCX nl, RCA min irregs.  . Stuttering during school years      Past Surgical History: Past Surgical History:  Procedure Laterality Date  . APPENDECTOMY    . COLONOSCOPY WITH PROPOFOL N/A 02/27/2018   Procedure: COLONOSCOPY WITH PROPOFOL;  Surgeon: Lin Landsman, MD;  Location: [facility] ENDOSCOPY;  Service: Gastroenterology;  Laterality: N/A;  . EYE SURGERY    . RIGHT/LEFT HEART CATH AND CORONARY ANGIOGRAPHY N/A 02/09/2019   Procedure: RIGHT/LEFT HEART CATH AND CORONARY ANGIOGRAPHY;  Surgeon: Wellington Hampshire, MD;  Location: [facility];  Service: Cardiovascular;  Laterality: N/A;     Family History: Family History  Problem Relation Age of Onset  . Cancer Brother   . Cancer  Father   . Uterine cancer Mother      Social History: Social History   Socioeconomic History  . Marital status: Single    Spouse name: Not on file  . Number of children: Not on file  . Years of education: Not on file  . Highest education level: Not on file  Occupational History  . Occupation: retired  Tobacco Use  . Smoking status: Never Smoker  . Smokeless tobacco: Never Used  Vaping Use  . Vaping Use: Never used  Substance and Sexual Activity  . Alcohol use: No    Alcohol/week: 0.0 standard drinks  . Drug use: No  . Sexual activity: Not on file    Comment: Not Asked  Other Topics Concern  . Not on file  Social History Narrative   Lives at home with his nephew.  Unsteady gait.   Social Determinants of Health   Financial Resource Strain: Not on file  Food Insecurity: Not on file  Transportation Needs: Not on file  Physical Activity: Not on file  Stress: Not on file  Social Connections: Not on file  Intimate Partner Violence: Not on file     Review of Systems: Review of Systems  Constitutional: Negative.   HENT: Negative.   Eyes: Negative.   Respiratory: Negative for cough, hemoptysis, sputum production, shortness of breath and wheezing.   Cardiovascular: Negative for chest pain, palpitations, orthopnea, claudication, leg swelling and PND.  Gastrointestinal: Negative.   Genitourinary: Negative for dysuria, flank pain, frequency, hematuria and urgency.  Musculoskeletal: Negative for back pain, falls, joint pain, myalgias and neck pain.  Skin: Negative.   Neurological: Positive for dizziness.  Endo/Heme/Allergies: Negative.   Psychiatric/Behavioral: Negative.     Vital Signs: Blood pressure (!) 86/39, pulse 82, temperature (!) 95.4 F (35.2 C), temperature source Oral, resp. rate 15, height 5' 6"  (1.676 m), weight 67 kg, SpO2 100 %.  Weight trends: Filed Weights   02/20/2021 0912  Weight: 67 kg    Physical Exam: General: NAD,   Head: Normocephalic,  atraumatic. Moist oral mucosal membranes  Eyes: Anicteric, PERRL  Neck: Supple, trachea midline  Lungs:  Clear to auscultation  Heart: Regular rate and rhythm  Abdomen:  Soft, nontender,   Extremities:  no peripheral edema.  Neurologic: Nonfocal, moving all four extremities  Skin: No lesions  Access: none     Lab results: Basic Metabolic Panel: Recent Labs  Lab 01/27/2021 0924 01/25/2021 1209  NA 140  --   K 5.6* 5.4*  CL 115*  --   CO2 14*  --   GLUCOSE 107*  --   BUN 110*  --  CREATININE 4.92*  --   CALCIUM 8.8*  --     Liver Function Tests: No results for input(s): AST, ALT, ALKPHOS, BILITOT, PROT, ALBUMIN in the last 168 hours. No results for input(s): LIPASE, AMYLASE in the last 168 hours. No results for input(s): AMMONIA in the last 168 hours.  CBC: Recent Labs  Lab 02/05/2021 0924  WBC 25.1*  HGB 10.6*  HCT 34.5*  MCV 94.3  PLT 269    Cardiac Enzymes: No results for input(s): CKTOTAL, CKMB, CKMBINDEX, TROPONINI in the last 168 hours.  BNP: Invalid input(s): POCBNP  CBG: No results for input(s): GLUCAP in the last 168 hours.  Microbiology: Results for orders placed or performed during the hospital encounter of 06/21/20  Urine Culture     Status: Abnormal   Collection Time: 06/21/20 12:59 PM   Specimen: Urine, Clean Catch  Result Value Ref Range Status   Specimen Description   Final    URINE, CLEAN CATCH Performed at [facility], [address], [place], [place] [address]    Special Requests   Final    NONE Performed at [facility], [address], [place], [place] [address]    Culture (A)  Final    >=100,000 COLONIES/mL MULTIPLE SPECIES PRESENT, SUGGEST RECOLLECTION   Report Status 06/23/2020 FINAL  Final    Coagulation Studies: No results for input(s): LABPROT, INR in the last 72 hours.  Urinalysis: No results for input(s): COLORURINE, LABSPEC, PHURINE, GLUCOSEU, HGBUR, BILIRUBINUR, KETONESUR, PROTEINUR,  UROBILINOGEN, NITRITE, LEUKOCYTESUR in the last 72 hours.  Invalid input(s): APPERANCEUR    Imaging: CT ABDOMEN PELVIS WO CONTRAST  Result Date: 02/16/2021 CLINICAL DATA:  Abdominal pain, fever EXAM: CT ABDOMEN AND PELVIS WITHOUT CONTRAST TECHNIQUE: Multidetector CT imaging of the abdomen and pelvis was performed following the standard protocol without IV contrast. COMPARISON:  CT 06/14/2020, 01/09/2019 FINDINGS: Lower chest: Included lung bases are clear.  Heart size is normal. Hepatobiliary: Significant mass effect upon the liver from markedly distended colon. No focal liver abnormality is seen. No gallstones, gallbladder wall thickening, or biliary dilatation. Pancreas: Unremarkable. No pancreatic ductal dilatation or surrounding inflammatory changes. Spleen: Normal in size without focal abnormality. Adrenals/Urinary Tract: Adrenal glands within normal limits. Multiple large stones within the bladder are again noted. Multiple bladder diverticula. There is a small amount of air within the bladder lumen, most pronounced anteriorly. Chronic urinary bladder wall thickening. Interval development of moderate bilateral hydronephrosis. There are a few small foci of air within the left renal pelvis. Stable exophytic left renal cyst. Stomach/Bowel: Severe diffuse colonic dilatation with transition point again noted within the mid sigmoid colon (series 2, image 69). Circumferentially thickened 6 cm segment of mid sigmoid colon at the level of transition containing numerous diverticula. Similar degree of mild adjacent fat stranding. Loss of fat plane between the bladder dome and thickened sigmoid colon raises the possibility of a colovesical fistula, although no well-defined fistulous track is evident. No dilated loops of small bowel. Stomach within normal limits. Vascular/Lymphatic: Scattered aortoiliac atherosclerotic calcifications without aneurysm. No abdominopelvic lymphadenopathy. Reproductive: Similar degree  of mild prostatomegaly. Other: No ascites.  No pneumoperitoneum. Musculoskeletal: No acute or significant osseous findings. IMPRESSION: 1. Findings of chronic cystitis with interval development of moderate bilateral hydronephrosis. There are a few small foci of air within the left renal pelvis. Findings raise suspicion for ascending urinary tract infection. Correlate with urinalysis. 2. Persistent severe diffuse colonic dilatation with transition point again noted within the mid sigmoid colon.  Circumferentially thickened 6 cm segment of mid sigmoid colon containing numerous diverticula. Similar degree of mild adjacent fat stranding. Findings may represent stricture related to chronic diverticulitis. Underlying colonic neoplasm not excluded. 3. Loss of fat plane between the bladder dome and thickened sigmoid colon raises the possibility of a colovesical fistula. Aortic Atherosclerosis (ICD10-I70.0). Electronically Signed   By: Davina Poke D.O.   On: 02/20/2021 11:12   CT HEAD WO CONTRAST  Result Date: 02/02/2021 CLINICAL DATA:  Mental status change, unknown cause. EXAM: CT HEAD WITHOUT CONTRAST TECHNIQUE: Contiguous axial images were obtained from the base of the skull through the vertex without intravenous contrast. COMPARISON:  Head CT January 05, 2019 FINDINGS: Brain: No evidence of acute infarction, hemorrhage, hydrocephalus, extra-axial collection or mass lesion/mass effect. Vascular: No hyperdense vessel. Atherosclerotic calcifications of the internal carotid and vertebral arteries. Skull: Normal. Negative for fracture or focal lesion. Sinuses/Orbits: Scattered with mucosal thickening of the posterior ethmoid air cells. Other: Similar appearance of the partially visualized 10 mm subcutaneous nodule in the posterior upper neck just off midline, likely a sebaceous cyst but incompletely evaluated. IMPRESSION: 1. No acute intracranial abnormality. Electronically Signed   By: Dahlia Bailiff MD   On:  02/12/2021 09:57   DG Chest Portable 1 View  Result Date: 02/15/2021 CLINICAL DATA:  Sepsis. EXAM: PORTABLE CHEST 1 VIEW COMPARISON:  01/05/2019 FINDINGS: 1025 hours. Low volume film. Interstitial markings are diffusely coarsened with chronic features. Insert upper no The lungs are clear without focal pneumonia, edema, pneumothorax or pleural effusion. Marked gaseous distention of colon identified under both hemidiaphragms, similar to prior. IMPRESSION: 1. Chronic interstitial coarsening without acute cardiopulmonary findings. 2. Marked gaseous distention of the colon under both hemidiaphragms. Electronically Signed   By: Misty Stanley M.D.   On: 02/02/2021 10:46      Assessment & Plan: [name] is a 75 y.o. white male with hypertension, coronary artery disease, congestive heart failure, history of bowel perforation, , who was admitted to [facility] on 02/11/2021 for Cystitis [N30.90]  1. 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   2. Hypotension: requiring vasopressors: norepinephrine.  Secondary to sepsis - empiric antibiotics: cefepime and vancomycin  LOS: 0 Illiana Losurdo 2/15/20222:51 PM

## 2021-02-07 NOTE — Progress Notes (Signed)
Elink following Code Sepsis. 

## 2021-02-07 NOTE — Consult Note (Signed)
Pharmacy Antibiotic Note  William Holland is a 75 y.o. male admitted on 01/29/2021 with UTI.    Pharmacy has been consulted for Cefepime dosing.  Plan: Will dose Cefepime 2g q24h Will follow Scr daily initially to assess for appropriate dosing  Height: 5' 6"  (167.6 cm) Weight: 67 kg (147 lb 11.3 oz) IBW/kg (Calculated) : 63.8  Temp (24hrs), Avg:95.4 F (35.2 C), Min:95.4 F (35.2 C), Max:95.4 F (35.2 C)  Recent Labs  Lab 02/15/2021 0924 01/29/2021 1030  WBC 25.1*  --   CREATININE 4.92*  --   LATICACIDVEN  --  1.1    Estimated Creatinine Clearance: 11.9 mL/min (A) (by C-G formula based on SCr of 4.92 mg/dL (H)).    No Known Allergies  Antimicrobials this admission: 2/15 Cefepime/Vanc/Metronidazole x 1  2/15 Cefepime >>   Dose adjustments this admission: none  Microbiology results: 2/15 BCx: pending No pending UCx currently  Thank you for allowing pharmacy to be a part of this patient's care.  Lu Duffel, PharmD, BCPS Clinical Pharmacist 02/17/2021 1:32 PM

## 2021-02-07 NOTE — ED Notes (Signed)
Pt brief saturated with urine and some light brown stool. Able to roll side to side to be cleaned with wipes. New brief placed. primofit placed.

## 2021-02-07 NOTE — Consult Note (Signed)
PHARMACY CONSULT NOTE - FOLLOW UP  Pharmacy Consult for Electrolyte Monitoring and Replacement   Recent Labs: Potassium (mmol/L)  Date Value  02/12/2021 5.4 (H)   Magnesium (mg/dL)  Date Value  05/04/2020 2.3   Calcium (mg/dL)  Date Value  02/02/2021 8.8 (L)   Albumin (g/dL)  Date Value  06/29/2020 4.3   Phosphorus (mg/dL)  Date Value  01/06/2019 3.2   Sodium (mmol/L)  Date Value  02/13/2021 140  06/29/2020 143     Assessment: 75 yo male with mild hyperkalemia on norepinephrine drip and being treated with cefepime for UTI. Pharmacy has been consulted to monitor and replenish electrolytes.   Goal of Therapy:  Electrolytes wnl's  Plan:  No replenishment warranted at this time - will follow K, Mg, and Phos with am labs  Lu Duffel ,PharmD Clinical Pharmacist 02/09/2021 1:37 PM

## 2021-02-07 NOTE — ED Notes (Signed)
Pt taken to CT.

## 2021-02-07 NOTE — H&P (Addendum)
NAME:  William Holland, MRN:  [id number], DOB:  24-Apr-1946, LOS: 0 ADMISSION DATE:  02/17/2021, CONSULTATION DATE:  01/30/2021 REFERRING MD:  Dr. Cherylann Banas, CHIEF COMPLAINT: Abdominal Pain & Syncope  Brief History:  75 yo male presenting to the ED in shock requiring low dose pressors, admitted to ICU.  History of Present Illness:  75 yo male presented to the ED after a syncopal event with loss of consciousness. The patient's brother reported the patient was sitting, getting his hair cut when he fell over and became unresponsive for about 15 minutes. The patient denied experiencing any symptoms prior to falling over, no dizziness/lightheadness/prodromal symptoms. Only other complaint is mild pain to light palpation of abdomen which is chronically distended. Patient denies fever/chills/cough, does admit to diarrhea but states this is chronic. Patient denies that anything like this has ever happened to him before.  ED course: Initial vitals: hypothermic- 95.4/ NSR- 96/ RR- 13/ hypotensive- 71/52 (60). Labs significant for: leukocytosis-WBC 25.1, hyperkalemic 5.6, Cl 115, serum CO2 14, BUN/Cr 110/4.92, BNP 128.4, troponin 18~20, lactic 1.1, PCT 1.87 Past Medical History:  CAD NICM Diverticulosis Chronic combined systolic & diastolic HF  Significant Hospital Events:  02/03/2021 Admission with suspected UTI, low dose levophed  Consults:  Nephrology  Procedures:    Significant Diagnostic Tests:  02/05/2021 CT Abdomen pelvis >>  Findings of chronic cystitis with interval development of moderate bilateral hydronephrosis. There are a few small foci of air within the left renal pelvis. Findings raise suspicion for ascending urinary tract infection. Correlate with urinalysis. Persistent severe diffuse colonic dilatation with transition point again noted within the mid sigmoid colon. Circumferentially thickened 6 cm segment of mid sigmoid colon containing numerous diverticula. Similar degree of mild adjacent fat  stranding. Findings may represent stricture related to chronic diverticulitis. Underlying colonic neoplasm not excluded. Loss of fat plane between the bladder dome and thickened sigmoid colon raises the possibility of a colovesical fistula.  Micro Data:  02/08/2021 COVID 19 >> 02/14/2021 BC x 2 >>  Antimicrobials:   02/06/2021 Cefepime >> 02/15/2021 Flagyl >> 01/26/2021 Vancomycin x 1  Interim History / Subjective:  Patient lying in bed, alert and responsive but soft spoken with brother at bedside. Patient reports mild intermittent pain, abdomen very distended which he reports as chronic. We discussed plan of care, all questions answered.  Labs/ Imaging personally reviewed Na+/ K+: 140/ 5.4 BUN/Cr.: 110/ 4.92 Hgb: 10.6  WBC/ TMAX: 25.1/ afebrile Lactic/ PCT: 1.1/ 1.87  CXR 02/09/2021: chronic interstitial coarsening without acute findings. Marked gaseous distention of the colon under hemidiaphragms Objective   Blood pressure (!) 89/45, pulse 87, temperature (!) 95.4 F (35.2 C), temperature source Oral, resp. rate 13, height 5' 6"  (1.676 m), weight 67 kg, SpO2 100 %.       No intake or output data in the 24 hours ending 02/17/2021 1259 Filed Weights   01/28/2021 0912  Weight: 67 kg    Examination: General: Adult male, critically/chronically ill, lying in bed, NAD HEENT: MM pink/dry, anicteric, atraumatic, neck supple Neuro: *A&O x 4, able to follow commands, PERRL +3, MAE CV: s1s2 RRR, NSR on monitor, no r/m/g Pulm: Regular, non labored on room air, breath sounds diminished throughout GI: markedly distended, mildly tender, bs x 4 Skin: limited exam- no rashes/lesions noted Extremities: warm/dry, pulses + 2 R/P, trace edema noted BLE  Resolved Hospital Problem list     Assessment & Plan:  Suspected Sepsis with septic shock Due to suspected UTI in the setting of chronic  cystitis Lactic: 1.1, Baseline PCT: 1.87, UA: pending, CXR: chronic without acute disease, CT: concern for UTI Initial  interventions/workup included: 2.25 L of LR & Cefepime/ Vancomycin/ Metronidazole - Supplemental oxygen as needed, to maintain SpO2 > 90% - f/u cultures, trend lactic/ PCT - Daily CBC - monitor WBC/ fever curve - IV antibiotics: cefepime & flagyl - IVF hydration as needed - Consider vasopressors to maintain MAP< 65, norepinephrine 1st line - Strict I/O's: alert provider if UOP < 0.5 mL/kg/hr - Persistent hypotension consider stress dose steroids (hydrocortisone 50q6 or 100q8)  Acute Kidney Injury on CKD Stage IV Metabolic Acidosis In the setting of shock. 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 Hampton Abbot reviewed current imaging and states this is chronic presentation based on previous imaging and consultation. In 06/2020 the patient was not a good candidate for surgical intervention. - supportive care - consider general surgery consult  HFrEF- LVEF 30-35% NICM CAD Due to syncopal nature of admission, will consult cardiology - cautious IVF - home medications on hold in the setting of hypotension & AKI on CKD: metoprolol, losartan, torsemide. Consider restarting as patient stabilizes  Best practice (evaluated daily)  Diet: Heart healthy Pain/Anxiety/Delirium protocol (if indicated): N/a VAP protocol (if indicated): n/a DVT prophylaxis: heparin SQ GI prophylaxis: N/a Glucose control: monitor as needed Mobility: bedrest, mobilize as tolerated Disposition:ICU  Goals of Care:  Last date of multidisciplinary goals of care discussion:01/27/2021 Family and staff present: MD, APP, brother & patient Summary of discussion: plan of care including antibiotics &  vasopressors Follow up goals of care discussion due: 02/08/21 Code Status: FULL  Labs   CBC: Recent Labs  Lab 02/10/2021 0924  WBC 25.1*  HGB 10.6*  HCT 34.5*  MCV 94.3  PLT 277    Basic Metabolic Panel: Recent Labs  Lab 02/09/2021 0924  NA 140  K 5.6*  CL 115*  CO2 14*  GLUCOSE 107*  BUN 110*  CREATININE 4.92*  CALCIUM 8.8*   GFR: Estimated Creatinine Clearance: 11.9 mL/min (A) (by C-G formula based on SCr of 4.92 mg/dL (H)). Recent Labs  Lab 02/12/2021 0924 01/27/2021 1030  WBC 25.1*  --   LATICACIDVEN  --  1.1    Liver Function Tests: No results for input(s): AST, ALT, ALKPHOS, BILITOT, PROT, ALBUMIN in the last 168 hours. No results for input(s): LIPASE, AMYLASE in the last 168 hours. No results for input(s): AMMONIA in the last 168 hours.  ABG No results found for: PHART, PCO2ART, PO2ART, HCO3, TCO2, ACIDBASEDEF, O2SAT   Coagulation Profile: No results for input(s): INR, PROTIME in the last 168 hours.  Cardiac Enzymes: No results for input(s): CKTOTAL, CKMB, CKMBINDEX, TROPONINI in the last 168 hours.  HbA1C: Hgb A1c MFr Bld  Date/Time Value Ref Range Status  01/05/2019 04:37 PM 5.2 4.8 - 5.6 % Final    Comment:    (NOTE) Pre diabetes:          5.7%-6.4% Diabetes:              >6.4% Glycemic control for   <7.0% adults with diabetes     CBG: No results for input(s): GLUCAP in the last 168 hours.  Review of Systems: POSITIVES in BOLD  Gen: Denies fever, chills, weight  change, fatigue, night sweats HEENT: Denies blurred vision, double vision, hearing loss, tinnitus, sinus congestion, rhinorrhea, sore throat, neck stiffness, dysphagia PULM: Denies shortness of breath, cough, sputum production, hemoptysis, wheezing CV: Denies chest pain, edema, orthopnea, paroxysmal nocturnal dyspnea, palpitations GI: Denies abdominal pain, nausea, vomiting, diarrhea, hematochezia, melena, constipation, change in bowel habits GU: Denies dysuria, hematuria, polyuria,  oliguria, urethral discharge Endocrine: Denies hot or cold intolerance, polyuria, polyphagia or appetite change Derm: Denies rash, dry skin, scaling or peeling skin change Heme: Denies easy bruising, bleeding, bleeding gums Neuro: Denies headache, numbness, weakness, slurred speech, loss of memory or consciousness  Past Medical History:  He,  has a past medical history of Anemia, Bowel perforation (HCC), Chronic combined systolic (congestive) and diastolic (congestive) heart failure (HCC), Chronic diarrhea, Diverticulosis, sigmoid (02/27/2018), NICM (nonischemic cardiomyopathy) ([place]), Nonobstructive CAD (coronary artery disease), and Stuttering during school years.   Surgical History:   Past Surgical History:  Procedure Laterality Date  . APPENDECTOMY    . COLONOSCOPY WITH PROPOFOL N/A 02/27/2018   Procedure: COLONOSCOPY WITH PROPOFOL;  Surgeon: Lin Landsman, MD;  [facility] END[facility]COPY;  Service: Gastroenterology;  Laterality: N/A;  . EYE SURGERY    . RIGHT/LEFT HEART CATH AND CORONARY ANGIOGRAPHY N/A 02/09/2019   Procedure: RIGHT/LEFT HEART CATH AND CORONARY ANGIOGRAPHY;  Surgeon: Wellington Hampshire, MD;  Location: [facility] CV LAB;  Service: Cardiovascular;  Laterality: N/A;     Social History:   reports that he has never smoked. He has never used smokeless tobacco. He reports that he does not drink alcohol and does not use drugs.   Family History:  His family history includes Cancer in his brother and father; Uterine cancer in his mother.   Allergies No Known Allergies   Home Medications  Prior to Admission medications   Medication Sig Start Date End Date Taking? Authorizing Provider  losartan (COZAAR) 25 MG tablet Take 12.5 mg by mouth daily. 07/17/20  Yes [provider]  metoprolol succinate (TOPROL XL) 25 MG 24 hr tablet Take 0.5 tablets (12.5 mg total) by mouth daily. 01/23/21  Yes Wellington Hampshire, MD  potassium chloride (KLOR-CON) 10 MEQ tablet Take 2  tablets (20 mEq total) by mouth daily as needed (when taking fluid pill). Patient taking differently: Take 20 mEq by mouth daily. 07/15/20  Yes Dunn, Areta Haber, PA-C  torsemide (DEMADEX) 20 MG tablet Take 1 tablet (20 mg total) each morning. Take additional dose (20 mg total) on Monday and Thursday afternoons Patient taking differently: Take 20 mg by mouth daily. 09/01/20  Yes Rise Mu, PA-C     Critical care time: 45 minutes       William Holland, AGACNP-BC Acute Care Nurse Practitioner [facility]   [phone] / [phone] Please see Amion for pager details.

## 2021-02-07 NOTE — ED Provider Notes (Signed)
[facility] Emergency Department Provider Note ____________________________________________   Event Date/Time   First MD Initiated Contact with Patient 02/02/2021 [phone]     (approximate)  I have reviewed the triage vital signs and the nursing notes.   HISTORY  Chief Complaint Abdominal Pain and Near Syncope    HPI William Holland is a 75 y.o. male with PMH as noted below including chronic kidney disease, CHF, and bowel perforation who presents with an episode of syncope, acute onset this morning.  The patient's brother states that he was sitting and getting his haircut (at home) when he slumped in the chair and became unresponsive for approximately 15 minutes.  The patient denies any prodrome or lightheadedness before this happened.  He states he feels somewhat unwell right now.  He has some periumbilical abdominal pain but no other focal complaints.  Past Medical History:  Diagnosis Date  . Anemia   . Bowel perforation ([place])   . Chronic combined systolic (congestive) and diastolic (congestive) heart failure (HCC)    a. 12/2018 Echo: EF of 30 to 35%, diffuse hypokinesis, grade 2 diastolic dysfunction, mild mitral regurgitation, moderately dilated left atrium, moderately reduced RV systolic function, trivial pericardial effusion was identified, along with a right pleural effusion  . Chronic diarrhea   . Diverticulosis, sigmoid 02/27/2018   Colonoscopy done on 02/27/2018 by Dr. Marius Ditch  . NICM (nonischemic cardiomyopathy) ([place])    a. 12/2018 Echo: Ef 30-35%, diff HK, Gr2 DD.  Marland Kitchen Nonobstructive CAD (coronary artery disease)    a. 01/2019 Cath: LM nl, LAD nl, LCX nl, RCA min irregs.  . Stuttering during school years     Patient Active Problem List   Diagnosis Date Noted  . Severe sepsis with septic shock ([place]) 01/24/2021  . CAD (coronary artery disease) 02/14/2021  . Anemia in chronic kidney disease 02/03/2021  . Acute renal failure superimposed on stage 4 chronic  kidney disease ([place]) 02/16/2021  . Syncope 01/31/2021  . Hydronephrosis, bilateral 02/17/2021  . Hyperkalemia 01/31/2021  . Elevated troponin 01/29/2021  . UTI (urinary tract infection) 02/14/2021  . Cystitis 02/10/2021  . Chronic combined systolic and diastolic CHF, NYHA class 2 ([place]) 02/05/2019  . Chronic systolic (congestive) heart failure ([place]) 01/23/2019  . Hypotension 01/23/2019  . Diverticulosis 01/23/2019  . Lymphedema 01/23/2019  . Hypokalemia   . Bowel perforation ([place])   . CHF exacerbation ([place]) 01/05/2019  . Pressure injury of skin 01/05/2019  . Chronic diarrhea of unknown origin   . Loss of weight   . Abdominal pain 03/10/2016  . Colitis 03/10/2016    Past Surgical History:  Procedure Laterality Date  . APPENDECTOMY    . COLONOSCOPY WITH PROPOFOL N/A 02/27/2018   Procedure: COLONOSCOPY WITH PROPOFOL;  Surgeon: Lin Landsman, MD;  Location: [facility] ENDOSCOPY;  Service: Gastroenterology;  Laterality: N/A;  . EYE SURGERY    . RIGHT/LEFT HEART CATH AND CORONARY ANGIOGRAPHY N/A 02/09/2019   Procedure: RIGHT/LEFT HEART CATH AND CORONARY ANGIOGRAPHY;  Surgeon: Wellington Hampshire, MD;  Location: [place] [facility] LAB;  Service: Cardiovascular;  Laterality: N/A;    Prior to Admission medications   Medication Sig Start Date End Date Taking? Authorizing Provider  losartan (COZAAR) 25 MG tablet Take 12.5 mg by mouth daily. 07/17/20  Yes [provider]  metoprolol succinate (TOPROL XL) 25 MG 24 hr tablet Take 0.5 tablets (12.5 mg total) by mouth daily. 01/23/21  Yes Wellington Hampshire, MD  potassium chloride (KLOR-CON) 10 MEQ tablet Take 2  tablets (20 mEq total) by mouth daily as needed (when taking fluid pill). Patient taking differently: Take 20 mEq by mouth daily. 07/15/20  Yes Dunn, Areta Haber, PA-C  torsemide (DEMADEX) 20 MG tablet Take 1 tablet (20 mg total) each morning. Take additional dose (20 mg total) on Monday and Thursday afternoons Patient taking differently: Take  20 mg by mouth daily. 09/01/20  Yes Rise Mu, PA-C    Allergies Patient has no known allergies.  Family History  Problem Relation Age of Onset  . Cancer Brother   . Cancer Father   . Uterine cancer Mother     Social History Social History   Tobacco Use  . Smoking status: Never Smoker  . Smokeless tobacco: Never Used  Vaping Use  . Vaping Use: Never used  Substance Use Topics  . Alcohol use: No    Alcohol/week: 0.0 standard drinks  . Drug use: No    Review of Systems  Constitutional: No fever/chills. Eyes: No visual changes. ENT: No sore throat. Cardiovascular: Denies chest pain. Respiratory: Denies shortness of breath. Gastrointestinal: No nausea, no vomiting.  Positive for diarrhea. Genitourinary: Negative for dysuria.  Musculoskeletal: Negative for back pain. Skin: Negative for rash. Neurological: Negative for headaches, focal weakness or numbness.   ____________________________________________   PHYSICAL EXAM:  VITAL SIGNS: ED Triage Vitals  Enc Vitals Group     BP 01/28/2021 0914 (!) 70/43     Pulse Rate 01/31/2021 0914 (!) 102     Resp 02/09/2021 0914 20     Temp --      Temp src --      SpO2 01/24/2021 0914 100 %     Weight 02/05/2021 0912 147 lb 11.3 oz (67 kg)     Height 01/26/2021 0912 5' 6"  (1.676 m)     Head Circumference --      Peak Flow --      Pain Score 02/06/2021 0912 8     Pain Loc --      Pain Edu? --      Excl. in [place]? --     Constitutional: Alert and oriented.  Weak and frail appearing but in no acute distress. Eyes: Conjunctivae are normal.  Head: Atraumatic. Nose: No congestion/rhinnorhea. Mouth/Throat: Mucous membranes are moist.   Neck: Normal range of motion.  Cardiovascular: Normal rate, regular rhythm. Grossly normal heart sounds.  Good peripheral circulation. Respiratory: Normal respiratory effort.  No retractions. Lungs CTAB. Gastrointestinal: Soft with mild periumbilical tenderness.  No distention.  Genitourinary: No flank  tenderness. Musculoskeletal: No lower extremity edema.  Extremities warm and well perfused.  Neurologic:  Normal speech and language.  Motor intact in all extremities. Skin:  Skin is warm and dry. No rash noted. Psychiatric: Calm and cooperative.  ____________________________________________   LABS (all labs ordered are listed, but only abnormal results are displayed)  Labs Reviewed  BASIC METABOLIC PANEL - Abnormal; Notable for the following components:      Result Value   Potassium 5.6 (*)    Chloride 115 (*)    CO2 14 (*)    Glucose, Bld 107 (*)    BUN 110 (*)    Creatinine, Ser 4.92 (*)    Calcium 8.8 (*)    GFR, Estimated 12 (*)    All other components within normal limits  CBC - Abnormal; Notable for the following components:   WBC 25.1 (*)    RBC 3.66 (*)    Hemoglobin 10.6 (*)    HCT 34.5 (*)  All other components within normal limits  TROPONIN I (HIGH SENSITIVITY) - Abnormal; Notable for the following components:   Troponin I (High Sensitivity) 18 (*)    All other components within normal limits  TROPONIN I (HIGH SENSITIVITY) - Abnormal; Notable for the following components:   Troponin I (High Sensitivity) 20 (*)    All other components within normal limits  CULTURE, BLOOD (ROUTINE X 2)  CULTURE, BLOOD (ROUTINE X 2)  SARS CORONAVIRUS 2 (TAT 6-24 HRS)  LACTIC ACID, PLASMA  URINALYSIS, COMPLETE (UACMP) WITH MICROSCOPIC  LACTIC ACID, PLASMA  BRAIN NATRIURETIC PEPTIDE  PROCALCITONIN  POTASSIUM  CBG MONITORING, ED   ____________________________________________  EKG  ED ECG REPORT I, Arta Silence, the attending physician, personally viewed and interpreted this ECG.  Date: 02/01/2021 EKG Time: 0906 Rate: 100 Rhythm: normal sinus rhythm QRS Axis: normal Intervals: normal ST/T Wave abnormalities: Nonspecific ST abnormalities Narrative Interpretation: Nonspecific abnormalities with no evidence of acute  ischemia  ____________________________________________  RADIOLOGY  CT head: No ICH or other acute abnormality CT abdomen: Cystitis with bilateral hydronephrosis.  Chronic appearing colonic distention, possible stricture, possible colovesicular fistula Chest x-ray interpreted by me shows no focal infiltrate or edema  ____________________________________________   PROCEDURES  Procedure(s) performed: No  Procedures  Critical Care performed: Yes  CRITICAL CARE Performed by: Arta Silence   Total critical care time: 45 minutes  Critical care time was exclusive of separately billable procedures and treating other patients.  Critical care was necessary to treat or prevent imminent or life-threatening deterioration.  Critical care was time spent personally by me on the following activities: development of treatment plan with patient and/or surrogate as well as nursing, discussions with consultants, evaluation of patient's response to treatment, examination of patient, obtaining history from patient or surrogate, ordering and performing treatments and interventions, ordering and review of laboratory studies, ordering and review of radiographic studies, pulse oximetry and re-evaluation of patient's condition. ____________________________________________   INITIAL IMPRESSION / ASSESSMENT AND PLAN / ED COURSE  Pertinent labs & imaging results that were available during my care of the patient were reviewed by me and considered in my medical decision making (see chart for details).  75 year old male with PMH as noted above presents with an episode of syncope this morning associated with some generalized malaise and abdominal pain but no other focal symptoms.  I reviewed the past medical records in [place].  The patient has no recent prior ED visits or admissions here.  On exam, the patient is overall weak and frail appearing but in no acute distress.  He is alert and oriented.  He is  hypotensive with otherwise normal vital signs.  Mucous membranes are moist.  The abdomen is soft with mild periumbilical tenderness but no peritoneal signs.  He has no significant peripheral edema.  Neurologic exam is nonfocal.  Initial lab work-up reveals leukocytosis and acute on chronic renal sufficiency.  Differential includes infection/sepsis, dehydration, acute renal failure/uremia, other metabolic disturbance, or less likely primary cardiac or CNS cause.  The patient does have some abdominal pain and mild tenderness although no peritoneal signs, so I doubt acute surgical emergency.  I have initiated code sepsis.  We will give fluids, empiric antibiotics, obtain additional lab work-up, CT abdomen, and reassess.  I anticipate admission.  ----------------------------------------- 1:11 PM on 02/13/2021 -----------------------------------------  The patient's blood pressure has improved with fluids although he is still hypotensive to 89/45.  Urinalysis is pending.  The lactate is normal.  CT reveals findings compatible with acute  cystitis.  There is also chronic appearing colonic dilatation, possible colonic stricture, and possible colovesicular fistula.  I discussed the case with Dr. Hampton Abbot from general surgery who reviewed the clinical presentation and imaging.  He advises that the colonic findings all appear chronic and the patient had a previously identified possible fistula and was apparently lost to follow-up.  Dr. Hampton Abbot advises that there is no recommendation for acute surgical intervention.  Due to the patient's CHF history and sepsis with hypotension, I consulted APP [place] for admission to the ICU.  __________________________  Oris Drone was evaluated in Emergency Department on 02/06/2021 for the symptoms described in the history of present illness. He was evaluated in the context of the global COVID-19 pandemic, which necessitated consideration that the patient might be at risk  for infection with the SARS-CoV-2 virus that causes COVID-19. Institutional protocols and algorithms that pertain to the evaluation of patients at risk for COVID-19 are in a state of rapid change based on information released by regulatory bodies including the CDC and federal and state organizations. These policies and algorithms were followed during the patient's care in the ED.  ____________________________________________   FINAL CLINICAL IMPRESSION(S) / ED DIAGNOSES  Final diagnoses:  Cystitis  Sepsis, due to unspecified organism, unspecified whether acute organ dysfunction present [facility])      NEW MEDICATIONS STARTED DURING THIS VISIT:  New Prescriptions   No medications on file     Note:  This document was prepared using Dragon voice recognition software and may include unintentional dictation errors.    Arta Silence, MD 01/31/2021 1315

## 2021-02-08 ENCOUNTER — Inpatient Hospital Stay: Payer: Medicare Other

## 2021-02-08 ENCOUNTER — Ambulatory Visit: Payer: Medicare Other | Admitting: Physician Assistant

## 2021-02-08 LAB — HEPATITIS C ANTIBODY: HCV Ab: NONREACTIVE

## 2021-02-08 LAB — CBC
HCT: 23.8 % — ABNORMAL LOW (ref 39.0–52.0)
Hemoglobin: 7.6 g/dL — ABNORMAL LOW (ref 13.0–17.0)
MCH: 29.5 pg (ref 26.0–34.0)
MCHC: 31.9 g/dL (ref 30.0–36.0)
MCV: 92.2 fL (ref 80.0–100.0)
Platelets: 213 10*3/uL (ref 150–400)
RBC: 2.58 MIL/uL — ABNORMAL LOW (ref 4.22–5.81)
RDW: 14.5 % (ref 11.5–15.5)
WBC: 9.1 10*3/uL (ref 4.0–10.5)
nRBC: 0 % (ref 0.0–0.2)

## 2021-02-08 LAB — BASIC METABOLIC PANEL
Anion gap: 8 (ref 5–15)
BUN: 102 mg/dL — ABNORMAL HIGH (ref 8–23)
CO2: 17 mmol/L — ABNORMAL LOW (ref 22–32)
Calcium: 8.1 mg/dL — ABNORMAL LOW (ref 8.9–10.3)
Chloride: 115 mmol/L — ABNORMAL HIGH (ref 98–111)
Creatinine, Ser: 4.09 mg/dL — ABNORMAL HIGH (ref 0.61–1.24)
GFR, Estimated: 15 mL/min — ABNORMAL LOW (ref >60)
Glucose, Bld: 116 mg/dL — ABNORMAL HIGH (ref 70–99)
Potassium: 4.4 mmol/L (ref 3.5–5.1)
Sodium: 140 mmol/L (ref 135–145)

## 2021-02-08 LAB — HEPATITIS B CORE ANTIBODY, IGM: Hep B C IgM: NONREACTIVE

## 2021-02-08 LAB — HEPATITIS B SURFACE ANTIBODY,QUALITATIVE: Hep B S Ab: NONREACTIVE

## 2021-02-08 LAB — MAGNESIUM: Magnesium: 1.8 mg/dL (ref 1.7–2.4)

## 2021-02-08 LAB — PHOSPHORUS: Phosphorus: 3.9 mg/dL (ref 2.5–4.6)

## 2021-02-08 LAB — MRSA PCR SCREENING: MRSA by PCR: NEGATIVE

## 2021-02-08 LAB — GLUCOSE, CAPILLARY: Glucose-Capillary: 102 mg/dL — ABNORMAL HIGH (ref 70–99)

## 2021-02-08 LAB — HEPATITIS B SURFACE ANTIGEN: Hepatitis B Surface Ag: NONREACTIVE

## 2021-02-08 MED ORDER — ACETAMINOPHEN 325 MG PO TABS
650.0000 mg | ORAL_TABLET | Freq: Four times a day (QID) | ORAL | Status: DC | PRN
  Administered 2021-02-08 – 2021-02-12 (×3): 650 mg via ORAL
  Filled 2021-02-08 (×3): qty 2

## 2021-02-08 MED ORDER — MAGNESIUM SULFATE 2 GM/50ML IV SOLN
2.0000 g | Freq: Once | INTRAVENOUS | Status: AC
  Administered 2021-02-08: 2 g via INTRAVENOUS
  Filled 2021-02-08: qty 50

## 2021-02-08 MED ORDER — OXYCODONE-ACETAMINOPHEN 5-325 MG PO TABS
1.0000 | ORAL_TABLET | Freq: Four times a day (QID) | ORAL | Status: DC | PRN
  Administered 2021-02-12 – 2021-02-15 (×3): 2 via ORAL
  Administered 2021-02-15 – 2021-02-16 (×3): 1 via ORAL
  Administered 2021-02-17 – 2021-02-18 (×2): 2 via ORAL
  Filled 2021-02-08: qty 2
  Filled 2021-02-08: qty 1
  Filled 2021-02-08 (×4): qty 2
  Filled 2021-02-08 (×2): qty 1

## 2021-02-08 MED ORDER — CHLORHEXIDINE GLUCONATE CLOTH 2 % EX PADS
6.0000 | MEDICATED_PAD | Freq: Every day | CUTANEOUS | Status: DC
  Administered 2021-02-08 – 2021-02-11 (×4): 6 via TOPICAL

## 2021-02-08 NOTE — Progress Notes (Signed)
[place] Room IC10 Manufacturing engineer [facility]) Hospital Liaison RN note:  This patient is a pending palliative patient for out patient based palliative care with Manufacturing engineer. [place] Liaison will follow for disposition and notify TOC of pending palliative referral.  Thank you.  Zandra Abts, RN [facility] Liaison [phone]

## 2021-02-08 NOTE — Progress Notes (Signed)
[facility]  ROUNDING NOTE   Subjective:   Brother at bedside.   UOP 100 Sodium bicarb gtt   Objective:  Vital signs in last 24 hours:  Temp:  [97.8 F (36.6 C)-98.2 F (36.8 C)] 97.9 F (36.6 C) (02/16 1100) Pulse Rate:  [48-94] 71 (02/16 1100) Resp:  [9-18] 11 (02/16 1100) BP: (71-127)/(39-93) 102/48 (02/16 1100) SpO2:  [97 %-100 %] 99 % (02/16 1100)  Weight change:  Filed Weights   02/03/2021 0912  Weight: 67 kg    Intake/Output: I/O last 3 completed shifts: In: 3687.7 [I.V.:1175; IV Piggyback:2512.7] Out: 100 [Urine:100]   Intake/Output this shift:  No intake/output data recorded.  Physical Exam: General: Critically ill  Head: Normocephalic, atraumatic. Moist oral mucosal membranes  Eyes: Anicteric, PERRL  Neck: Supple, trachea midline  Lungs:  Clear to auscultation  Heart: Regular rate and rhythm  Abdomen:  Soft, nontender,   Extremities:  no peripheral edema.  Neurologic: Nonfocal, moving all four extremities  Skin: No lesions  Access: none    Basic Metabolic Panel: Recent Labs  Lab 02/15/2021 0924 01/28/2021 1209 02/08/2021 2007 02/08/21 0410  NA 140  --   --  140  K 5.6* 5.4* 5.0 4.4  CL 115*  --   --  115*  CO2 14*  --   --  17*  GLUCOSE 107*  --   --  116*  BUN 110*  --   --  102*  CREATININE 4.92*  --   --  4.09*  CALCIUM 8.8*  --   --  8.1*  MG  --   --   --  1.8  PHOS  --   --   --  3.9    Liver Function Tests: No results for input(s): AST, ALT, ALKPHOS, BILITOT, PROT, ALBUMIN in the last 168 hours. No results for input(s): LIPASE, AMYLASE in the last 168 hours. No results for input(s): AMMONIA in the last 168 hours.  CBC: Recent Labs  Lab 02/06/2021 0924 02/08/21 0520  WBC 25.1* 9.1  HGB 10.6* 7.6*  HCT 34.5* 23.8*  MCV 94.3 92.2  PLT 269 213    Cardiac Enzymes: No results for input(s): CKTOTAL, CKMB, CKMBINDEX, TROPONINI in the last 168 hours.  BNP: Invalid input(s): POCBNP  CBG: Recent Labs  Lab  02/08/21 0628  GLUCAP 102*    Microbiology: Results for orders placed or performed during the hospital encounter of 02/03/2021  Culture, blood (routine x 2)     Status: None (Preliminary result)   Collection Time: 02/20/2021 10:29 AM   Specimen: BLOOD  Result Value Ref Range Status   Specimen Description BLOOD LEFT ANTECUBITAL  Final   Special Requests   Final    BOTTLES DRAWN AEROBIC AND ANAEROBIC Blood Culture results may not be optimal due to an excessive volume of blood received in culture bottles   Culture   Final    NO GROWTH < 24 HOURS Performed at [facility], [address], [place], [place] [address]    Report Status PENDING  Incomplete  Culture, blood (routine x 2)     Status: None (Preliminary result)   Collection Time: 02/06/2021 10:30 AM   Specimen: BLOOD  Result Value Ref Range Status   Specimen Description BLOOD BLOOD LEFT FOREARM  Final   Special Requests   Final    BOTTLES DRAWN AEROBIC ONLY Blood Culture adequate volume   Culture   Final    NO GROWTH < 24 HOURS Performed at [facility], [place], [place] [address]    Report Status PENDING  Incomplete  SARS CORONAVIRUS 2 (TAT 6-24 HRS) Nasopharyngeal Nasopharyngeal Swab     Status: None   Collection Time: 02/16/2021 12:09 PM   Specimen: Nasopharyngeal Swab  Result Value Ref Range Status   SARS Coronavirus 2 NEGATIVE NEGATIVE Final    Comment: (NOTE) SARS-CoV-2 target nucleic acids are NOT DETECTED.  The SARS-CoV-2 RNA is generally detectable in upper and lower respiratory specimens during the acute phase of infection. Negative results do not preclude SARS-CoV-2 infection, do not rule out co-infections with other pathogens, and should not be used as the sole basis for treatment or other patient management decisions. Negative results must be combined with clinical observations, patient history, and epidemiological information. The expected result is Negative.  Fact Sheet for  Patients: SugarRoll.be  Fact Sheet for Healthcare Providers: https://www.woods-mathews.com/  This test is not yet approved or cleared by the [place] FDA and  has been authorized for detection and/or diagnosis of SARS-CoV-2 by FDA under an Emergency Use Authorization (EUA). This EUA will remain  in effect (meaning this test can be used) for the duration of the COVID-19 declaration under Se ction 564(b)(1) of the Act, 21 U.S.C. section 360bbb-3(b)(1), unless the authorization is terminated or revoked sooner.  Performed at [facility], [place] [address] [place], [place] [address]   MRSA PCR Screening     Status: None   Collection Time: 02/08/21  6:30 AM   Specimen: Nasopharyngeal  Result Value Ref Range Status   MRSA by PCR NEGATIVE NEGATIVE Final    Comment:        The GeneXpert MRSA Assay (FDA approved for NASAL specimens only), is one component of a comprehensive MRSA colonization surveillance program. It is not intended to diagnose MRSA infection nor to guide or monitor treatment for MRSA infections. Performed at [facility], [address] [place], [place] [address]     Coagulation Studies: No results for input(s): LABPROT, INR in the last 72 hours.  Urinalysis: Recent Labs    02/15/2021 1616  COLORURINE YELLOW*  LABSPEC 1.010  PHURINE 7.0  GLUCOSEU NEGATIVE  HGBUR MODERATE*  BILIRUBINUR NEGATIVE  KETONESUR NEGATIVE  PROTEINUR 100*  NITRITE NEGATIVE  LEUKOCYTESUR LARGE*      Imaging: CT ABDOMEN PELVIS WO CONTRAST  Result Date: 02/03/2021 CLINICAL DATA:  Abdominal pain, fever EXAM: CT ABDOMEN AND PELVIS WITHOUT CONTRAST TECHNIQUE: Multidetector CT imaging of the abdomen and pelvis was performed following the standard protocol without IV contrast. COMPARISON:  CT 06/14/2020, 01/09/2019 FINDINGS: Lower chest: Included lung bases are clear.  Heart size is normal. Hepatobiliary: Significant mass  effect upon the liver from markedly distended colon. No focal liver abnormality is seen. No gallstones, gallbladder wall thickening, or biliary dilatation. Pancreas: Unremarkable. No pancreatic ductal dilatation or surrounding inflammatory changes. Spleen: Normal in size without focal abnormality. Adrenals/Urinary Tract: Adrenal glands within normal limits. Multiple large stones within the bladder are again noted. Multiple bladder diverticula. There is a small amount of air within the bladder lumen, most pronounced anteriorly. Chronic urinary bladder wall thickening. Interval development of moderate bilateral hydronephrosis. There are a few small foci of air within the left renal pelvis. Stable exophytic left renal cyst. Stomach/Bowel: Severe diffuse colonic dilatation with transition point again noted within the mid sigmoid colon (series 2, image 69). Circumferentially thickened 6 cm segment of mid sigmoid colon at the level of transition containing numerous diverticula. Similar degree of  mild adjacent fat stranding. Loss of fat plane between the bladder dome and thickened sigmoid colon raises the possibility of a colovesical fistula, although no well-defined fistulous track is evident. No dilated loops of small bowel. Stomach within normal limits. Vascular/Lymphatic: Scattered aortoiliac atherosclerotic calcifications without aneurysm. No abdominopelvic lymphadenopathy. Reproductive: Similar degree of mild prostatomegaly. Other: No ascites.  No pneumoperitoneum. Musculoskeletal: No acute or significant osseous findings. IMPRESSION: 1. Findings of chronic cystitis with interval development of moderate bilateral hydronephrosis. There are a few small foci of air within the left renal pelvis. Findings raise suspicion for ascending urinary tract infection. Correlate with urinalysis. 2. Persistent severe diffuse colonic dilatation with transition point again noted within the mid sigmoid colon. Circumferentially  thickened 6 cm segment of mid sigmoid colon containing numerous diverticula. Similar degree of mild adjacent fat stranding. Findings may represent stricture related to chronic diverticulitis. Underlying colonic neoplasm not excluded. 3. Loss of fat plane between the bladder dome and thickened sigmoid colon raises the possibility of a colovesical fistula. Aortic Atherosclerosis (ICD10-I70.0). Electronically Signed   By: Davina Poke D.O.   On: 02/05/2021 11:12   CT HEAD WO CONTRAST  Result Date: 01/25/2021 CLINICAL DATA:  Mental status change, unknown cause. EXAM: CT HEAD WITHOUT CONTRAST TECHNIQUE: Contiguous axial images were obtained from the base of the skull through the vertex without intravenous contrast. COMPARISON:  Head CT January 05, 2019 FINDINGS: Brain: No evidence of acute infarction, hemorrhage, hydrocephalus, extra-axial collection or mass lesion/mass effect. Vascular: No hyperdense vessel. Atherosclerotic calcifications of the internal carotid and vertebral arteries. Skull: Normal. Negative for fracture or focal lesion. Sinuses/Orbits: Scattered with mucosal thickening of the posterior ethmoid air cells. Other: Similar appearance of the partially visualized 10 mm subcutaneous nodule in the posterior upper neck just off midline, likely a sebaceous cyst but incompletely evaluated. IMPRESSION: 1. No acute intracranial abnormality. Electronically Signed   By: Dahlia Bailiff MD   On: 02/05/2021 09:57   DG Chest Portable 1 View  Result Date: 02/03/2021 CLINICAL DATA:  Sepsis. EXAM: PORTABLE CHEST 1 VIEW COMPARISON:  01/05/2019 FINDINGS: 1025 hours. Low volume film. Interstitial markings are diffusely coarsened with chronic features. Insert upper no The lungs are clear without focal pneumonia, edema, pneumothorax or pleural effusion. Marked gaseous distention of colon identified under both hemidiaphragms, similar to prior. IMPRESSION: 1. Chronic interstitial coarsening without acute  cardiopulmonary findings. 2. Marked gaseous distention of the colon under both hemidiaphragms. Electronically Signed   By: Misty Stanley M.D.   On: 01/30/2021 10:46     Medications:   . sodium chloride 250 mL (02/20/2021 1618)  . ceFEPime (MAXIPIME) IV 2 g (02/08/21 1129)  . metronidazole Stopped (02/08/21 0257)  . norepinephrine (LEVOPHED) Adult infusion 4 mcg/min (02/08/21 0944)  . sodium bicarbonate (isotonic) 150 mEq in D5W 1000 mL infusion 75 mL/hr at 02/08/21 1130   . Chlorhexidine Gluconate Cloth  6 each Topical Q0600  . heparin  5,000 Units Subcutaneous Q8H   docusate sodium, polyethylene glycol  Assessment/ Plan:  William Holland is a 75 y.o. white male with hypertension, coronary artery disease, congestive heart failure, history of bowel perforation, , who was admitted to [facility] on 02/05/2021 for Cystitis [N30.90]  1. 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  No indication for dialysis at this time. If anuric tomorrow, will consider renal  replacement therapy Anuric currently but will monitor for renal recovery.  - Continue sodium bicarbonate infusion.  - check renal ultrasound  2. Hypotension: requiring vasopressors: norepinephrine.  Secondary to sepsis - empiric antibiotics: cefepime and vancomycin   LOS: 1 Aubry Rankin 2/16/202212:40 PM

## 2021-02-08 NOTE — Progress Notes (Signed)
NAME:  William Holland, MRN:  [id number], DOB:  1946-01-12, LOS: 1 ADMISSION DATE:  02/03/2021, CONSULTATION DATE:  02/15/2021 REFERRING MD:  Dr. Cherylann Banas, CHIEF COMPLAINT: Abdominal Pain & Syncope  Brief History:  75 yo male presenting to the ED in shock requiring low dose pressors, admitted to ICU.  History of Present Illness:  75 yo male presented to the ED after a syncopal event with loss of consciousness. The patient's brother reported the patient was sitting, getting his hair cut when he fell over and became unresponsive for about 15 minutes. The patient denied experiencing any symptoms prior to falling over, no dizziness/lightheadness/prodromal symptoms. Only other complaint is mild pain to light palpation of abdomen which is chronically distended. Patient denies fever/chills/cough, does admit to diarrhea but states this is chronic. Patient denies that anything like this has ever happened to him before.  ED course: Initial vitals: hypothermic- 95.4/ NSR- 96/ RR- 13/ hypotensive- 71/52 (60). Labs significant for: leukocytosis-WBC 25.1, hyperkalemic 5.6, Cl 115, serum CO2 14, BUN/Cr 110/4.92, BNP 128.4, troponin 18~20, lactic 1.1, PCT 1.87    EVENTS  02/08/2021- Patient is improved, he is weaning to levo 39mg today.  Weaning to room air today. Will optimize for stepdown.    Past Medical History:  CAD NICM Diverticulosis Chronic combined systolic & diastolic HF  Significant Hospital Events:  02/10/2021 Admission with suspected UTI, low dose levophed  Consults:  Nephrology  Procedures:    Significant Diagnostic Tests:  02/08/2021 CT Abdomen pelvis >>  Findings of chronic cystitis with interval development of moderate bilateral hydronephrosis. There are a few small foci of air within the left renal pelvis. Findings raise suspicion for ascending urinary tract infection. Correlate with urinalysis. Persistent severe diffuse colonic dilatation with transition point again noted within the mid  sigmoid colon. Circumferentially thickened 6 cm segment of mid sigmoid colon containing numerous diverticula. Similar degree of mild adjacent fat stranding. Findings may represent stricture related to chronic diverticulitis. Underlying colonic neoplasm not excluded. Loss of fat plane between the bladder dome and thickened sigmoid colon raises the possibility of a colovesical fistula.  Micro Data:  01/31/2021 COVID 19 >> 02/06/2021 BC x 2 >>  Antimicrobials:   02/06/2021 Cefepime >> 02/08/2021 Flagyl >> 02/16/2021 Vancomycin x 1   Objective   Blood pressure (!) 109/54, pulse (!) 54, temperature 97.8 F (36.6 C), temperature source Oral, resp. rate 13, height 5' 6"  (1.676 m), weight 67 kg, SpO2 99 %.        Intake/Output Summary (Last 24 hours) at 02/08/2021 1030 Last data filed at 02/08/2021 0600 Gross per 24 hour  Intake 3687.65 ml  Output 100 ml  Net 3587.65 ml   Filed Weights   02/08/2021 0912  Weight: 67 kg    Examination: General: Adult male, critically/chronically ill, lying in bed, NAD HEENT: MM pink/dry, anicteric, atraumatic, neck supple Neuro: *A&O x 4, able to follow commands, PERRL +3, MAE CV: s1s2 RRR, NSR on monitor, no r/m/g Pulm: Regular, non labored on room air, breath sounds diminished throughout GI: markedly distended, mildly tender, bs x 4 Skin: limited exam- no rashes/lesions noted Extremities: warm/dry, pulses + 2 R/P, trace edema noted BLE  Resolved Hospital Problem list     Assessment & Plan:  Suspected Sepsis with septic shock Due to suspected UTI in the setting of chronic cystitis Lactic: 1.1, Baseline PCT: 1.87, UA: pending, CXR: chronic without acute disease, CT: concern for UTI Initial interventions/workup included: 2.25 L of LR & Cefepime/ Vancomycin/ Metronidazole -  Supplemental oxygen as needed, to maintain SpO2 > 90% - f/u cultures, trend lactic/ PCT - Daily CBC - monitor WBC/ fever curve - IV antibiotics: cefepime & flagyl - IVF hydration as  needed - Consider vasopressors to maintain MAP< 65, norepinephrine 1st line - Strict I/O's: alert provider if UOP < 0.5 mL/kg/hr - Persistent hypotension consider stress dose steroids (hydrocortisone 50q6 or 100q8)  Acute Kidney Injury on CKD Stage IV Metabolic Acidosis In the setting of shock. 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 Hampton Abbot reviewed current imaging and states this is chronic presentation based on previous imaging and consultation. In 06/2020 the patient was not a good candidate for surgical intervention. - supportive care - consider general surgery consult  HFrEF- LVEF 30-35% NICM CAD Due to syncopal nature of admission, will consult cardiology - cautious IVF - home medications on hold in the setting of hypotension & AKI on CKD: metoprolol, losartan, torsemide. Consider restarting as patient stabilizes  Best practice (evaluated daily)  Diet: Heart healthy Pain/Anxiety/Delirium protocol (if indicated): N/a VAP protocol (if indicated): n/a DVT prophylaxis: heparin SQ GI prophylaxis: N/a Glucose control: monitor as needed Mobility: bedrest, mobilize as tolerated Disposition:ICU  Goals of Care:  Last date of multidisciplinary goals of care discussion:02/11/2021 Family and staff present: MD, APP, brother & patient Summary of discussion: plan of care including antibiotics & vasopressors Follow up goals of care discussion due: 02/08/21 Code Status: FULL  Labs   CBC: Recent Labs  Lab 01/30/2021 0924 02/08/21 0520  WBC 25.1* 9.1  HGB 10.6* 7.6*  HCT 34.5* 23.8*  MCV 94.3 92.2  PLT 269 116    Basic Metabolic Panel: Recent Labs  Lab  02/09/2021 0924 02/16/2021 1209 02/14/2021 2007 02/08/21 0410  NA 140  --   --  140  K 5.6* 5.4* 5.0 4.4  CL 115*  --   --  115*  CO2 14*  --   --  17*  GLUCOSE 107*  --   --  116*  BUN 110*  --   --  102*  CREATININE 4.92*  --   --  4.09*  CALCIUM 8.8*  --   --  8.1*  MG  --   --   --  1.8  PHOS  --   --   --  3.9   GFR: Estimated Creatinine Clearance: 14.3 mL/min (A) (by C-G formula based on SCr of 4.09 mg/dL (H)). Recent Labs  Lab 01/30/2021 0924 02/08/2021 1029 02/05/2021 1030 02/04/2021 1703 02/08/21 0520  PROCALCITON  --  1.87  --   --   --   WBC 25.1*  --   --   --  9.1  LATICACIDVEN  --   --  1.1 0.7  --     Liver Function Tests: No results for input(s): AST, ALT, ALKPHOS, BILITOT, PROT, ALBUMIN in the last 168 hours. No results for input(s): LIPASE, AMYLASE in the last 168 hours. No results for input(s): AMMONIA in the last 168 hours.  ABG No results found for: PHART, PCO2ART, PO2ART, HCO3, TCO2, ACIDBASEDEF, O2SAT   Coagulation Profile: No results for input(s): INR, PROTIME in the last 168 hours.  Cardiac Enzymes: No results for input(s): CKTOTAL, CKMB, CKMBINDEX, TROPONINI in the last 168  hours.  HbA1C: Hgb A1c MFr Bld  Date/Time Value Ref Range Status  01/05/2019 04:37 PM 5.2 4.8 - 5.6 % Final    Comment:    (NOTE) Pre diabetes:          5.7%-6.4% Diabetes:              >6.4% Glycemic control for   <7.0% adults with diabetes     CBG: Recent Labs  Lab 02/08/21 0628  GLUCAP 102*    Review of Systems: POSITIVES in BOLD  Gen: Denies fever, chills, weight change, fatigue, night sweats HEENT: Denies blurred vision, double vision, hearing loss, tinnitus, sinus congestion, rhinorrhea, sore throat, neck stiffness, dysphagia PULM: Denies shortness of breath, cough, sputum production, hemoptysis, wheezing CV: Denies chest pain, edema, orthopnea, paroxysmal nocturnal dyspnea, palpitations GI: Denies abdominal pain, nausea, vomiting, diarrhea, hematochezia,  melena, constipation, change in bowel habits GU: Denies dysuria, hematuria, polyuria, oliguria, urethral discharge Endocrine: Denies hot or cold intolerance, polyuria, polyphagia or appetite change Derm: Denies rash, dry skin, scaling or peeling skin change Heme: Denies easy bruising, bleeding, bleeding gums Neuro: Denies headache, numbness, weakness, slurred speech, loss of memory or consciousness  Past Medical History:  He,  has a past medical history of Anemia, Bowel perforation (HCC), Chronic combined systolic (congestive) and diastolic (congestive) heart failure (HCC), Chronic diarrhea, Diverticulosis, sigmoid (02/27/2018), NICM (nonischemic cardiomyopathy) ([place]), Nonobstructive CAD (coronary artery disease), and Stuttering during school years.   Surgical History:   Past Surgical History:  Procedure Laterality Date  . APPENDECTOMY    . COLONOSCOPY WITH PROPOFOL N/A 02/27/2018   Procedure: COLONOSCOPY WITH PROPOFOL;  Surgeon: Lin Landsman, MD;  Location: [facility] ENDOSCOPY;  Service: Gastroenterology;  Laterality: N/A;  . EYE SURGERY    . RIGHT/LEFT HEART CATH AND CORONARY ANGIOGRAPHY N/A 02/09/2019   Procedure: RIGHT/LEFT HEART CATH AND CORONARY ANGIOGRAPHY;  Surgeon: Wellington Hampshire, MD;  Location: [facility];  Service: Cardiovascular;  Laterality: N/A;     Social History:   reports that he has never smoked. He has never used smokeless tobacco. He reports that he does not drink alcohol and does not use drugs.   Family History:  His family history includes Cancer in his brother and father; Uterine cancer in his mother.   Allergies No Known Allergies   Home Medications  Prior to Admission medications   Medication Sig Start Date End Date Taking? Authorizing Provider  losartan (COZAAR) 25 MG tablet Take 12.5 mg by mouth daily. 07/17/20  Yes [provider]  metoprolol succinate (TOPROL XL) 25 MG 24 hr tablet Take 0.5 tablets (12.5 mg total) by mouth daily.  01/23/21  Yes Wellington Hampshire, MD  potassium chloride (KLOR-CON) 10 MEQ tablet Take 2 tablets (20 mEq total) by mouth daily as needed (when taking fluid pill). Patient taking differently: Take 20 mEq by mouth daily. 07/15/20  Yes Dunn, Areta Haber, PA-C  torsemide (DEMADEX) 20 MG tablet Take 1 tablet (20 mg total) each morning. Take additional dose (20 mg total) on Monday and Thursday afternoons Patient taking differently: Take 20 mg by mouth daily. 09/01/20  Yes Rise Mu, PA-C     Critical care time: 33 minutes     Critical care provider statement:    Critical care time (minutes):  33   Critical care time was exclusive of:  Separately billable procedures and  treating other patients   Critical care was necessary to treat or prevent imminent or  life-threatening deterioration of the following  conditions:  septic shock, multiple comorbid conditions   Critical care was time spent personally by me on the following  activities:  Development of treatment plan with patient or surrogate,  discussions with consultants, evaluation of patient's response to  treatment, examination of patient, obtaining history from patient or  surrogate, ordering and performing treatments and interventions, ordering  and review of laboratory studies and re-evaluation of patient's condition   I assumed direction of critical care for this patient from another  provider in my specialty: no     Ottie Glazier, M.D.  Pulmonary & [place]

## 2021-02-09 ENCOUNTER — Inpatient Hospital Stay: Payer: Medicare Other

## 2021-02-09 DIAGNOSIS — R6521 Severe sepsis with septic shock: Secondary | ICD-10-CM | POA: Diagnosis not present

## 2021-02-09 DIAGNOSIS — A419 Sepsis, unspecified organism: Secondary | ICD-10-CM | POA: Diagnosis not present

## 2021-02-09 LAB — PROTEIN ELECTRO, RANDOM URINE
Albumin ELP, Urine: 34.8 %
Alpha-1-Globulin, U: 3.8 %
Alpha-2-Globulin, U: 15 %
Beta Globulin, U: 22.5 %
Gamma Globulin, U: 24 %
Total Protein, Urine: 121.3 mg/dL

## 2021-02-09 LAB — CBC
HCT: 23.8 % — ABNORMAL LOW (ref 39.0–52.0)
Hemoglobin: 7.8 g/dL — ABNORMAL LOW (ref 13.0–17.0)
MCH: 29.4 pg (ref 26.0–34.0)
MCHC: 32.8 g/dL (ref 30.0–36.0)
MCV: 89.8 fL (ref 80.0–100.0)
Platelets: 195 10*3/uL (ref 150–400)
RBC: 2.65 MIL/uL — ABNORMAL LOW (ref 4.22–5.81)
RDW: 14.4 % (ref 11.5–15.5)
WBC: 7.6 10*3/uL (ref 4.0–10.5)
nRBC: 0 % (ref 0.0–0.2)

## 2021-02-09 LAB — KAPPA/LAMBDA LIGHT CHAINS
Kappa free light chain: 125.8 mg/L — ABNORMAL HIGH (ref 3.3–19.4)
Kappa, lambda light chain ratio: 1.68 — ABNORMAL HIGH (ref 0.26–1.65)
Lambda free light chains: 75 mg/L — ABNORMAL HIGH (ref 5.7–26.3)

## 2021-02-09 LAB — BASIC METABOLIC PANEL
Anion gap: 11 (ref 5–15)
BUN: 95 mg/dL — ABNORMAL HIGH (ref 8–23)
CO2: 21 mmol/L — ABNORMAL LOW (ref 22–32)
Calcium: 7.9 mg/dL — ABNORMAL LOW (ref 8.9–10.3)
Chloride: 109 mmol/L (ref 98–111)
Creatinine, Ser: 4.03 mg/dL — ABNORMAL HIGH (ref 0.61–1.24)
GFR, Estimated: 15 mL/min — ABNORMAL LOW (ref >60)
Glucose, Bld: 124 mg/dL — ABNORMAL HIGH (ref 70–99)
Potassium: 3.5 mmol/L (ref 3.5–5.1)
Sodium: 141 mmol/L (ref 135–145)

## 2021-02-09 LAB — URINE CULTURE: Culture: >=100000 — AB

## 2021-02-09 LAB — PHOSPHORUS: Phosphorus: 3.1 mg/dL (ref 2.5–4.6)

## 2021-02-09 LAB — MAGNESIUM: Magnesium: 2.3 mg/dL (ref 1.7–2.4)

## 2021-02-09 MED ORDER — PIPERACILLIN-TAZOBACTAM IN DEX 2-0.25 GM/50ML IV SOLN
2.2500 g | Freq: Three times a day (TID) | INTRAVENOUS | Status: DC
  Administered 2021-02-09 – 2021-02-12 (×9): 2.25 g via INTRAVENOUS
  Filled 2021-02-09 (×13): qty 50

## 2021-02-09 NOTE — Consult Note (Addendum)
Urology Consult   I have been asked to see the patient by Dr. Juleen China for evaluation and management of bilateral hydronephrosis.  Reason for consult: Bilateral hydronephrosis  HPI:  William Holland is a 75 y.o. 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  I have known him since January 2020.  He is conversational today and denies any pain or urinary symptoms.  He is a poor historian, most of the history is again obtained from chart review.  His brother is not here to provide additional history currently.  Renal function on admission showed a creatinine of 4.92 from a baseline of 2.5.  CT showed stable chronic bladder stones with a decompressed bladder, and new moderate bilateral hydronephrosis down to the level of the bladder.  PMH: Past Medical History:  Diagnosis Date  . Anemia   . Bowel perforation ([place])   . Chronic combined systolic (congestive) and diastolic (congestive) heart failure (HCC)    a. 12/2018 Echo: EF of 30 to 35%, diffuse hypokinesis, grade 2 diastolic dysfunction, mild mitral regurgitation, moderately dilated left atrium, moderately reduced RV systolic function, trivial pericardial effusion was identified, along with a right pleural effusion  . Chronic diarrhea   . Diverticulosis, sigmoid 02/27/2018   Colonoscopy done on 02/27/2018 by Dr. Marius Ditch  . NICM (nonischemic cardiomyopathy) ([place])    a. 12/2018 Echo: Ef 30-35%, diff HK, Gr2 DD.  Marland Kitchen Nonobstructive CAD (coronary artery disease)    a. 01/2019 Cath: LM nl, LAD nl, LCX nl, RCA min irregs.  . Stuttering during school years     Allergies: No Known Allergies  Family History: Family History  Problem Relation Age of Onset  . Cancer Brother   . Cancer Father   . Uterine cancer Mother      Social History:  reports that he has never smoked. He has never used smokeless tobacco. He reports that he does not drink alcohol and does not use drugs.  ROS: Negative aside from those stated in the HPI.  Physical Exam: BP (!) 118/52   Pulse 88   Temp 98.7 F (37.1 C) (Oral)   Resp 18   Ht 5' 6"  (1.676 m)   Wt 67 kg   SpO2 99%   BMI 23.84 kg/m    Constitutional:  Alert and oriented, No acute distress.  Delayed at baseline Cardiovascular: No clubbing, cyanosis, or edema. Respiratory: Normal respiratory effort, no increased work of breathing. GI: Abdomen is distended, tympanic, nontender, no peritoneal signs GU: No CVA tenderness External catheter in place draining yellow urine  Laboratory Data: Reviewed in epic  Pertinent Imaging: I have personally reviewed and interpreted the CT showing decompressed bladder with moderate bilateral hydronephrosis and foci of air in the left renal pelvis, as well as significant colonic dilatation.   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  On review of his CT today he has new mild to moderate hydronephrosis down to the level of the bladder, but the bladder is decompressed.  Interestingly, a renal ultrasound in June 2021 also suggested moderate to severe bilateral hydronephrosis, but further evaluation  with a CT 2 weeks later did not show any definitive hydronephrosis and bladder was decompressed.  I suspect he has a thickened bladder wall secondary to his chronic bladder stones and potential chronic outlet obstruction that may be resulting in some upstream hydronephrosis, in addition to possible acute infection.  We reviewed options at length including observation, ureteral stent  placement, or nephrostomy tubes.  Ureteral stents would likely cause additional infection and result in reflux upstream and I do not anticipate this would significantly improve his renal function based on the fact that his bladder is decompressed on current CT.  Numerous times in the past the patient and his family and caregivers have been averse to aggressive interventions with his numerous medical problems and comorbidities, and I do not think bilateral nephrostomy tubes would be a feasible long-term option for him and would significantly negatively impact his quality of life, with little to no improvement in his renal function.  He has no pain or symptoms currently, and I think a more palliative approach is appropriate, and the patient and his family have reiterated this on multiple visits over the last 2 years.  Agree with antibiotics and follow-up cultures Would not recommend any aggressive intervention with ureteral stents or nephrostomy tubes Call if questions  Billey Co, MD  Total time spent on the floor was 80 minutes, with greater than 50% spent in counseling and coordination of care with the patient regarding bladder stones, bilateral hydronephrosis, acute on chronic kidney failure, UTI.  [facility] [address] [place], [place] [address] [phone]

## 2021-02-09 NOTE — Progress Notes (Signed)
[facility]  ROUNDING NOTE   Subjective:   Creatinine 4.03 (4.09)  UOP 726m Sodium bicarb gtt   Objective:  Vital signs in last 24 hours:  Temp:  [97 F (36.1 C)-98.1 F (36.7 C)] 98.1 F (36.7 C) (02/17 0800) Pulse Rate:  [46-93] 93 (02/17 0900) Resp:  [10-18] 15 (02/17 0900) BP: (73-127)/(37-60) 112/59 (02/17 0900) SpO2:  [94 %-100 %] 100 % (02/17 0900)  Weight change:  Filed Weights   02/20/2021 0912  Weight: 67 kg    Intake/Output: I/O last 3 completed shifts: In: 3864.1 [I.V.:3078.7; IV Piggyback:785.5] Out: 750 [Urine:750]   Intake/Output this shift:  Total I/O In: 180.1 [I.V.:180.1] Out: -   Physical Exam: General: ill  Head: Normocephalic, atraumatic. Moist oral mucosal membranes  Eyes: Anicteric, PERRL  Neck: Supple, trachea midline  Lungs:  Clear to auscultation  Heart: Regular rate and rhythm  Abdomen:  Soft, nontender, +distended  Extremities:  no peripheral edema.  Neurologic: Nonfocal, moving all four extremities  Skin: No lesions  Access: none    Basic Metabolic Panel: Recent Labs  Lab 02/15/2021 0924 02/03/2021 1209 02/20/2021 2007 02/08/21 0410 02/09/21 0638  NA 140  --   --  140 141  K 5.6* 5.4* 5.0 4.4 3.5  CL 115*  --   --  115* 109  CO2 14*  --   --  17* 21*  GLUCOSE 107*  --   --  116* 124*  BUN 110*  --   --  102* 95*  CREATININE 4.92*  --   --  4.09* 4.03*  CALCIUM 8.8*  --   --  8.1* 7.9*  MG  --   --   --  1.8 2.3  PHOS  --   --   --  3.9 3.1    Liver Function Tests: No results for input(s): AST, ALT, ALKPHOS, BILITOT, PROT, ALBUMIN in the last 168 hours. No results for input(s): LIPASE, AMYLASE in the last 168 hours. No results for input(s): AMMONIA in the last 168 hours.  CBC: Recent Labs  Lab 01/29/2021 0924 02/08/21 0520 02/09/21 0638  WBC 25.1* 9.1 7.6  HGB 10.6* 7.6* 7.8*  HCT 34.5* 23.8* 23.8*  MCV 94.3 92.2 89.8  PLT 269 213 195    Cardiac Enzymes: No results for input(s): CKTOTAL, CKMB,  CKMBINDEX, TROPONINI in the last 168 hours.  BNP: Invalid input(s): POCBNP  CBG: Recent Labs  Lab 02/08/21 0628  GLUCAP 102*    Microbiology: Results for orders placed or performed during the hospital encounter of 02/12/2021  Culture, blood (routine x 2)     Status: None (Preliminary result)   Collection Time: 02/04/2021 10:29 AM   Specimen: BLOOD  Result Value Ref Range Status   Specimen Description BLOOD LEFT ANTECUBITAL  Final   Special Requests   Final    BOTTLES DRAWN AEROBIC AND ANAEROBIC Blood Culture results may not be optimal due to an excessive volume of blood received in culture bottles   Culture   Final    NO GROWTH 2 DAYS [facility] [address], [place] [id number]   Report Status PENDING  Incomplete  Culture, blood (routine x 2)     Status: None (Preliminary result)   Collection Time: 01/27/2021 10:30 AM   Specimen: BLOOD  Result Value Ref Range Status   Specimen Description BLOOD BLOOD LEFT FOREARM  Final   Special Requests   Final    BOTTLES DRAWN AEROBIC ONLY Blood  Culture adequate volume   Culture   Final    NO GROWTH 2 DAYS Performed at [facility], [place], [place] [address]    Report Status PENDING  Incomplete  SARS CORONAVIRUS 2 (TAT 6-24 HRS) Nasopharyngeal Nasopharyngeal Swab     Status: None   Collection Time: 01/24/2021 12:09 PM   Specimen: Nasopharyngeal Swab  Result Value Ref Range Status   SARS Coronavirus 2 NEGATIVE NEGATIVE Final    Comment: (NOTE) SARS-CoV-2 target nucleic acids are NOT DETECTED.  The SARS-CoV-2 RNA is generally detectable in upper and lower respiratory specimens during the acute phase of infection. Negative results do not preclude SARS-CoV-2 infection, do not rule out co-infections with other pathogens, and should not be used as the sole basis for treatment or other patient management decisions. Negative results must be combined with clinical  observations, patient history, and epidemiological information. The expected result is Negative.  Fact Sheet for Patients: SugarRoll.be  Fact Sheet for Healthcare Providers: https://www.woods-mathews.com/  This test is not yet approved or cleared by the [place] FDA and  has been authorized for detection and/or diagnosis of SARS-CoV-2 by FDA under an Emergency Use Authorization (EUA). This EUA will remain  in effect (meaning this test can be used) for the duration of the COVID-19 declaration under Se ction 564(b)(1) of the Act, 21 U.S.C. section 360bbb-3(b)(1), unless the authorization is terminated or revoked sooner.  Performed at [facility], [place] [address] [place], [place] [address]   Urine Culture     Status: Abnormal   Collection Time: 01/24/2021  4:16 PM   Specimen: Urine, Random  Result Value Ref Range Status   Specimen Description   Final    URINE, RANDOM Performed at [facility], [address] [place], [place] [address]    Special Requests   Final    NONE Performed at [facility], [address] [place], [place] [address]    Culture (A)  Final    >=100,000 COLONIES/mL MULTIPLE SPECIES PRESENT, SUGGEST RECOLLECTION   Report Status 02/09/2021 FINAL  Final  MRSA PCR Screening     Status: None   Collection Time: 02/08/21  6:30 AM   Specimen: Nasopharyngeal  Result Value Ref Range Status   MRSA by PCR NEGATIVE NEGATIVE Final    Comment:        The GeneXpert MRSA Assay (FDA approved for NASAL specimens only), is one component of a comprehensive MRSA colonization surveillance program. It is not intended to diagnose MRSA infection nor to guide or monitor treatment for MRSA infections. Performed at [facility], [place] [place],  [address]     Coagulation Studies: No results for input(s): LABPROT, INR in the last 72 hours.  Urinalysis: Recent Labs     01/24/2021 1616  COLORURINE YELLOW*  LABSPEC 1.010  PHURINE 7.0  GLUCOSEU NEGATIVE  HGBUR MODERATE*  BILIRUBINUR NEGATIVE  KETONESUR NEGATIVE  PROTEINUR 100*  NITRITE NEGATIVE  LEUKOCYTESUR LARGE*      Imaging: US RENAL  Result Date: 02/08/2021 CLINICAL DATA:  Chronic kidney disease stage 4. EXAM: RENAL / URINARY TRACT ULTRASOUND COMPLETE COMPARISON:  May 26, 2020.  February 07, 2021. FINDINGS: Right Kidney: Renal measurements: 10.3 x 6.1 x 4.4 cm = volume: 146 mL. Echogenicity within normal limits. No mass visualized. Moderate hydronephrosis is noted. 8 mm cyst is seen in midpole. Left Kidney: Renal measurements: 12.0 x 5.3 x 4.1 cm = volume: 137 mL. Echogenicity within normal limits.  No mass visualized. Moderate hydronephrosis is noted. 4 cm simple cyst is seen in midpole. Bladder: Multiple large bladder calculi are again noted. Calculated prevoid volume of 10 mL is noted. Patient was unable to void. Other: None. IMPRESSION: Moderate bilateral hydronephrosis is noted. Continued presence of multiple large bladder calculi. Electronically Signed   By: Marijo Conception M.D.   On: 02/08/2021 15:36   DG Chest Port 1 View  Result Date: 02/09/2021 CLINICAL DATA:  Abnormal x-ray. EXAM: PORTABLE CHEST 1 VIEW COMPARISON:  Two days ago FINDINGS: Very gas dilated colon in the upper abdomen. Low volume lungs. There is no edema, consolidation, effusion, or pneumothorax. Normal heart size and mediastinal contours. IMPRESSION: 1. Negative low volume chest. 2. Persistent gas distended colon as evaluated by recent abdominal CT. Electronically Signed   By: Monte Fantasia M.D.   On: 02/09/2021 11:48     Medications:   . sodium chloride 250 mL (02/09/2021 1618)  . norepinephrine (LEVOPHED) Adult infusion 4 mcg/min (02/09/21 0900)  . sodium bicarbonate (isotonic) 150 mEq in D5W 1000 mL infusion 75 mL/hr at 02/09/21 0900   . Chlorhexidine Gluconate Cloth  6 each Topical Q0600  . heparin  5,000 Units  Subcutaneous Q8H   acetaminophen, docusate sodium, oxyCODONE-acetaminophen, polyethylene glycol  Assessment/ Plan:  [name] is a 75 y.o. white male with hypertension, coronary artery disease, congestive heart failure, history of bowel perforation, , who was admitted to [facility] on 02/20/2021 for Cystitis [N30.90]  1. 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  No indication for dialysis at this time. If anuric tomorrow, will consider renal replacement therapy Nonoliguric urine output.  - Continue sodium bicarbonate infusion.  - moderate hydronephrosis with calculi - will get urology opinion.   2. Hypotension: requiring vasopressors: norepinephrine.  - empiric antibiotics: cefepime and vancomycin - Stress dose steroids as per critical care team   LOS: 2 William Holland 2/17/202212:31 PM

## 2021-02-09 NOTE — Progress Notes (Signed)
NAME:  William Holland, MRN:  [id number], DOB:  1946-02-18, LOS: 2 ADMISSION DATE:  01/27/2021, CONSULTATION DATE:  02/11/2021 REFERRING MD:  Dr. Cherylann Banas, CHIEF COMPLAINT: Abdominal Pain & Syncope  Brief History:  75 yo male presenting to the ED in shock requiring low dose pressors, admitted to ICU.  History of Present Illness:  75 yo male presented to the ED after a syncopal event with loss of consciousness. The patient's brother reported the patient was sitting, getting his hair cut when he fell over and became unresponsive for about 15 minutes. The patient denied experiencing any symptoms prior to falling over, no dizziness/lightheadness/prodromal symptoms. Only other complaint is mild pain to light palpation of abdomen which is chronically distended. Patient denies fever/chills/cough, does admit to diarrhea but states this is chronic. Patient denies that anything like this has ever happened to him before.    EVENTS  02/08/2021- Patient is improved, he is weaning to levo 75mg today.  Weaning to room air today. Will optimize for stepdown.   02/09/2021- patient is still on pressor support.  He is improved clinically will move to SDU today. patietn is eating on his own.    Past Medical History:  CAD NICM Diverticulosis Chronic combined systolic & diastolic HF  Significant Hospital Events:  02/12/2021 Admission with suspected UTI, low dose levophed  Consults:  Nephrology  Procedures:    Significant Diagnostic Tests:  02/05/2021 CT Abdomen pelvis >>  Findings of chronic cystitis with interval development of moderate bilateral hydronephrosis. There are a few small foci of air within the left renal pelvis. Findings raise suspicion for ascending urinary tract infection. Correlate with urinalysis. Persistent severe diffuse colonic dilatation with transition point again noted within the mid sigmoid colon. Circumferentially thickened 6 cm segment of mid sigmoid colon containing numerous diverticula.  Similar degree of mild adjacent fat stranding. Findings may represent stricture related to chronic diverticulitis. Underlying colonic neoplasm not excluded. Loss of fat plane between the bladder dome and thickened sigmoid colon raises the possibility of a colovesical fistula.  Micro Data:  02/02/2021 COVID 19 >> 01/28/2021 BC x 2 >>  Antimicrobials:   02/02/2021 Cefepime >> 01/27/2021 Flagyl >> 02/08/2021 Vancomycin x 1   Objective   Blood pressure (!) 100/54, pulse (!) 57, temperature 98 F (36.7 C), resp. rate 14, height 5' 6"  (1.676 m), weight 67 kg, SpO2 98 %.        Intake/Output Summary (Last 24 hours) at 02/09/2021 05809Last data filed at 02/09/2021 0700 Gross per 24 hour  Intake 2515.67 ml  Output 750 ml  Net 1765.67 ml   Filed Weights   02/16/2021 0912  Weight: 67 kg    Examination: General: Adult male, critically/chronically ill, lying in bed, NAD HEENT: MM pink/dry, anicteric, atraumatic, neck supple Neuro: *A&O x 4, able to follow commands, PERRL +3, MAE CV: s1s2 RRR, NSR on monitor, no r/m/g Pulm: Regular, non labored on room air, breath sounds diminished throughout GI: markedly distended, mildly tender, bs x 4 Skin: limited exam- no rashes/lesions noted Extremities: warm/dry, pulses + 2 R/P, trace edema noted BLE  Resolved Hospital Problem list     Assessment & Plan:  Suspected Sepsis with septic shock Due to suspected UTI in the setting of chronic cystitis Lactic: 1.1, Baseline PCT: 1.87, UA: pending, CXR: chronic without acute disease, CT: concern for UTI Initial interventions/workup included: 2.25 L of LR & Cefepime/ Vancomycin/ Metronidazole - Supplemental oxygen as needed, to maintain SpO2 > 90% - f/u cultures, trend lactic/  PCT - Daily CBC - monitor WBC/ fever curve - IV antibiotics: cefepime & flagyl - IVF hydration as needed - Consider vasopressors to maintain MAP< 65, norepinephrine 1st line - Strict I/O's: alert provider if UOP < 0.5 mL/kg/hr - Persistent  hypotension consider stress dose steroids (hydrocortisone 50q6 or 100q8)  Acute Kidney Injury on CKD Stage IV Metabolic Acidosis In the setting of shock. Baseline creatinine 2.36-2.57, creatinine on admission 4.92 - Strict I/O's: alert provider if UOP < 0.5 mL/kg/hr - gentle IVF hydration - Daily BMP, replace electrolytes PRN - Avoid nephrotoxic agents as able, ensure adequate renal perfusion - Nephrology following, appreciate input   HFrEF- LVEF 30-35% NICM CAD Due to syncopal nature of admission, will consult cardiology - cautious IVF - home medications on hold in the setting of hypotension & AKI on CKD: metoprolol, losartan, torsemide. Consider restarting as patient stabilizes  Best practice (evaluated daily)  Diet: Heart healthy Pain/Anxiety/Delirium protocol (if indicated): N/a VAP protocol (if indicated): n/a DVT prophylaxis: heparin SQ GI prophylaxis: N/a Glucose control: monitor as needed Mobility: bedrest, mobilize as tolerated Disposition:ICU  Goals of Care:  Last date of multidisciplinary goals of care discussion:02/02/2021 Family and staff present: MD, APP, brother & patient Summary of discussion: plan of care including antibiotics & vasopressors Follow up goals of care discussion due: 02/08/21 Code Status: FULL  Labs   CBC: Recent Labs  Lab 02/16/2021 0924 02/08/21 0520 02/09/21 0638  WBC 25.1* 9.1 7.6  HGB 10.6* 7.6* 7.8*  HCT 34.5* 23.8* 23.8*  MCV 94.3 92.2 89.8  PLT 269 213 086    Basic Metabolic Panel: Recent Labs  Lab 02/09/2021 0924 02/05/2021 1209 01/31/2021 2007 02/08/21 0410 02/09/21 0638  NA 140  --   --  140 141  K 5.6* 5.4* 5.0 4.4 3.5  CL 115*  --   --  115* 109  CO2 14*  --   --  17* 21*  GLUCOSE 107*  --   --  116* 124*  BUN 110*  --   --  102* 95*  CREATININE 4.92*  --   --  4.09* 4.03*  CALCIUM 8.8*  --   --  8.1* 7.9*  MG  --   --   --  1.8 2.3  PHOS  --   --   --  3.9 3.1   GFR: Estimated Creatinine Clearance: 14.5 mL/min (A)  (by C-G formula based on SCr of 4.03 mg/dL (H)). Recent Labs  Lab 02/16/2021 0924 02/01/2021 1029 02/02/2021 1030 01/25/2021 1703 02/08/21 0520 02/09/21 0638  PROCALCITON  --  1.87  --   --   --   --   WBC 25.1*  --   --   --  9.1 7.6  LATICACIDVEN  --   --  1.1 0.7  --   --     Liver Function Tests: No results for input(s): AST, ALT, ALKPHOS, BILITOT, PROT, ALBUMIN in the last 168 hours. No results for input(s): LIPASE, AMYLASE in the last 168 hours. No results for input(s): AMMONIA in the last 168 hours.  ABG No results found for: PHART, PCO2ART, PO2ART, HCO3, TCO2, ACIDBASEDEF, O2SAT   Coagulation Profile: No results for input(s): INR, PROTIME in the last 168 hours.  Cardiac Enzymes: No results for input(s): CKTOTAL, CKMB, CKMBINDEX, TROPONINI in the last 168 hours.  HbA1C: Hgb A1c MFr Bld  Date/Time Value Ref Range Status  01/05/2019 04:37 PM 5.2 4.8 - 5.6 % Final    Comment:    (NOTE)  Pre diabetes:          5.7%-6.4% Diabetes:              >6.4% Glycemic control for   <7.0% adults with diabetes     CBG: Recent Labs  Lab 02/08/21 0628  GLUCAP 102*    Review of Systems: POSITIVES in BOLD  Gen: Denies fever, chills, weight change, fatigue, night sweats HEENT: Denies blurred vision, double vision, hearing loss, tinnitus, sinus congestion, rhinorrhea, sore throat, neck stiffness, dysphagia PULM: Denies shortness of breath, cough, sputum production, hemoptysis, wheezing CV: Denies chest pain, edema, orthopnea, paroxysmal nocturnal dyspnea, palpitations GI: Denies abdominal pain, nausea, vomiting, diarrhea, hematochezia, melena, constipation, change in bowel habits GU: Denies dysuria, hematuria, polyuria, oliguria, urethral discharge Endocrine: Denies hot or cold intolerance, polyuria, polyphagia or appetite change Derm: Denies rash, dry skin, scaling or peeling skin change Heme: Denies easy bruising, bleeding, bleeding gums Neuro: Denies headache, numbness, weakness,  slurred speech, loss of memory or consciousness  Past Medical History:  He,  has a past medical history of Anemia, Bowel perforation (HCC), Chronic combined systolic (congestive) and diastolic (congestive) heart failure (HCC), Chronic diarrhea, Diverticulosis, sigmoid (02/27/2018), NICM (nonischemic cardiomyopathy) ([place]), Nonobstructive CAD (coronary artery disease), and Stuttering during school years.   Surgical History:   Past Surgical History:  Procedure Laterality Date  . APPENDECTOMY    . COLONOSCOPY WITH PROPOFOL N/A 02/27/2018   Procedure: COLONOSCOPY WITH PROPOFOL;  Surgeon: Lin Landsman, MD;  Location: [facility] ENDOSCOPY;  Service: Gastroenterology;  Laterality: N/A;  . EYE SURGERY    . RIGHT/LEFT HEART CATH AND CORONARY ANGIOGRAPHY N/A 02/09/2019   Procedure: RIGHT/LEFT HEART CATH AND CORONARY ANGIOGRAPHY;  Surgeon: Wellington Hampshire, MD;  Location: [facility];  Service: Cardiovascular;  Laterality: N/A;     Social History:   reports that he has never smoked. He has never used smokeless tobacco. He reports that he does not drink alcohol and does not use drugs.   Family History:  His family history includes Cancer in his brother and father; Uterine cancer in his mother.   Allergies No Known Allergies   Home Medications  Prior to Admission medications   Medication Sig Start Date End Date Taking? Authorizing Provider  losartan (COZAAR) 25 MG tablet Take 12.5 mg by mouth daily. 07/17/20  Yes [provider]  metoprolol succinate (TOPROL XL) 25 MG 24 hr tablet Take 0.5 tablets (12.5 mg total) by mouth daily. 01/23/21  Yes Wellington Hampshire, MD  potassium chloride (KLOR-CON) 10 MEQ tablet Take 2 tablets (20 mEq total) by mouth daily as needed (when taking fluid pill). Patient taking differently: Take 20 mEq by mouth daily. 07/15/20  Yes Dunn, Areta Haber, PA-C  torsemide (DEMADEX) 20 MG tablet Take 1 tablet (20 mg total) each morning. Take additional dose (20 mg total)  on Monday and Thursday afternoons Patient taking differently: Take 20 mg by mouth daily. 09/01/20  Yes Rise Mu, PA-C     Critical care time: 33 minutes     Critical care provider statement:    Critical care time (minutes):  33   Critical care time was exclusive of:  Separately billable procedures and  treating other patients   Critical care was necessary to treat or prevent imminent or  life-threatening deterioration of the following conditions:  septic shock, multiple comorbid conditions   Critical care was time spent personally by me on the following  activities:  Development of treatment plan with patient or surrogate,  discussions with consultants, evaluation of patient's response to  treatment, examination of patient, obtaining history from patient or  surrogate, ordering and performing treatments and interventions, ordering  and review of laboratory studies and re-evaluation of patient's condition   I assumed direction of critical care for this patient from another  provider in my specialty: no     Ottie Glazier, M.D.  [facility] [place]

## 2021-02-10 DIAGNOSIS — A419 Sepsis, unspecified organism: Secondary | ICD-10-CM | POA: Diagnosis not present

## 2021-02-10 DIAGNOSIS — R6521 Severe sepsis with septic shock: Secondary | ICD-10-CM | POA: Diagnosis not present

## 2021-02-10 LAB — PROTEIN ELECTROPHORESIS, SERUM
A/G Ratio: 0.8 (ref 0.7–1.7)
Albumin ELP: 2.7 g/dL — ABNORMAL LOW (ref 2.9–4.4)
Alpha-1-Globulin: 0.3 g/dL (ref 0.0–0.4)
Alpha-2-Globulin: 0.7 g/dL (ref 0.4–1.0)
Beta Globulin: 0.8 g/dL (ref 0.7–1.3)
Gamma Globulin: 1.5 g/dL (ref 0.4–1.8)
Globulin, Total: 3.3 g/dL (ref 2.2–3.9)
Total Protein ELP: 6 g/dL (ref 6.0–8.5)

## 2021-02-10 LAB — GASTROINTESTINAL PANEL BY PCR, STOOL (REPLACES STOOL CULTURE)

## 2021-02-10 LAB — BASIC METABOLIC PANEL
Anion gap: 10 (ref 5–15)
BUN: 84 mg/dL — ABNORMAL HIGH (ref 8–23)
CO2: 26 mmol/L (ref 22–32)
Calcium: 7.6 mg/dL — ABNORMAL LOW (ref 8.9–10.3)
Chloride: 105 mmol/L (ref 98–111)
Creatinine, Ser: 3.58 mg/dL — ABNORMAL HIGH (ref 0.61–1.24)
GFR, Estimated: 17 mL/min — ABNORMAL LOW (ref >60)
Glucose, Bld: 120 mg/dL — ABNORMAL HIGH (ref 70–99)
Potassium: 2.9 mmol/L — ABNORMAL LOW (ref 3.5–5.1)
Sodium: 141 mmol/L (ref 135–145)

## 2021-02-10 LAB — POTASSIUM: Potassium: 3.7 mmol/L (ref 3.5–5.1)

## 2021-02-10 LAB — CBC
HCT: 22.6 % — ABNORMAL LOW (ref 39.0–52.0)
Hemoglobin: 7.4 g/dL — ABNORMAL LOW (ref 13.0–17.0)
MCH: 29.7 pg (ref 26.0–34.0)
MCHC: 32.7 g/dL (ref 30.0–36.0)
MCV: 90.8 fL (ref 80.0–100.0)
Platelets: 201 10*3/uL (ref 150–400)
RBC: 2.49 MIL/uL — ABNORMAL LOW (ref 4.22–5.81)
RDW: 14.6 % (ref 11.5–15.5)
WBC: 8.9 10*3/uL (ref 4.0–10.5)
nRBC: 0 % (ref 0.0–0.2)

## 2021-02-10 LAB — C DIFFICILE (CDIFF) QUICK SCRN (NO PCR REFLEX)
C Diff antigen: NEGATIVE
C Diff interpretation: NOT DETECTED
C Diff toxin: NEGATIVE

## 2021-02-10 LAB — MAGNESIUM: Magnesium: 2.1 mg/dL (ref 1.7–2.4)

## 2021-02-10 LAB — PHOSPHORUS: Phosphorus: 2.7 mg/dL (ref 2.5–4.6)

## 2021-02-10 MED ORDER — POTASSIUM CHLORIDE IN NACL 20-0.9 MEQ/L-% IV SOLN
INTRAVENOUS | Status: DC
  Filled 2021-02-10 (×7): qty 1000

## 2021-02-10 MED ORDER — POTASSIUM CHLORIDE CRYS ER 20 MEQ PO TBCR
20.0000 meq | EXTENDED_RELEASE_TABLET | Freq: Every day | ORAL | Status: DC
  Administered 2021-02-10 – 2021-02-15 (×6): 20 meq via ORAL
  Filled 2021-02-10 (×6): qty 1

## 2021-02-10 MED ORDER — MIDODRINE HCL 5 MG PO TABS
5.0000 mg | ORAL_TABLET | Freq: Three times a day (TID) | ORAL | Status: DC
  Administered 2021-02-10 – 2021-02-11 (×3): 5 mg via ORAL
  Filled 2021-02-10 (×3): qty 1

## 2021-02-10 NOTE — Progress Notes (Signed)
[facility]  ROUNDING NOTE   Subjective:   Creatinine 3.58 (4.03) (4.09)  UOP 344m Sodium bicarb gtt  Norepinephrine gtt  Objective:  Vital signs in last 24 hours:  Temp:  [97.8 F (36.6 C)-98.7 F (37.1 C)] 98 F (36.7 C) (02/18 0430) Pulse Rate:  [46-133] 52 (02/18 0700) Resp:  [10-25] 14 (02/18 0700) BP: (80-139)/(31-100) 108/46 (02/18 0700) SpO2:  [71 %-100 %] 98 % (02/18 0700)  Weight change:  Filed Weights   02/13/2021 0912  Weight: 67 kg    Intake/Output: I/O last 3 completed shifts: In: 2974.1 [I.V.:2774.1; IV Piggyback:200] Out: 1175 [Urine:700; Stool:475]   Intake/Output this shift:  No intake/output data recorded.  Physical Exam: General: ill  Head: Normocephalic, atraumatic. Moist oral mucosal membranes  Eyes: Anicteric, PERRL  Neck: Supple, trachea midline  Lungs:  Clear to auscultation  Heart: Regular rate and rhythm  Abdomen:  Soft, nontender, +distended  Extremities:  no peripheral edema.  Neurologic: Nonfocal, moving all four extremities  Skin: No lesions  Access: none    Basic Metabolic Panel: Recent Labs  Lab 02/08/2021 0924 02/04/2021 1209 01/26/2021 2007 02/08/21 0410 02/09/21 0638 02/10/21 0611  NA 140  --   --  140 141 141  K 5.6* 5.4* 5.0 4.4 3.5 2.9*  CL 115*  --   --  115* 109 105  CO2 14*  --   --  17* 21* 26  GLUCOSE 107*  --   --  116* 124* 120*  BUN 110*  --   --  102* 95* 84*  CREATININE 4.92*  --   --  4.09* 4.03* 3.58*  CALCIUM 8.8*  --   --  8.1* 7.9* 7.6*  MG  --   --   --  1.8 2.3 2.1  PHOS  --   --   --  3.9 3.1 2.7    Liver Function Tests: No results for input(s): AST, ALT, ALKPHOS, BILITOT, PROT, ALBUMIN in the last 168 hours. No results for input(s): LIPASE, AMYLASE in the last 168 hours. No results for input(s): AMMONIA in the last 168 hours.  CBC: Recent Labs  Lab 02/12/2021 0924 02/08/21 0520 02/09/21 0638 02/10/21 0611  WBC 25.1* 9.1 7.6 8.9  HGB 10.6* 7.6* 7.8* 7.4*  HCT 34.5* 23.8*  23.8* 22.6*  MCV 94.3 92.2 89.8 90.8  PLT 269 213 195 201    Cardiac Enzymes: No results for input(s): CKTOTAL, CKMB, CKMBINDEX, TROPONINI in the last 168 hours.  BNP: Invalid input(s): POCBNP  CBG: Recent Labs  Lab 02/08/21 0628  GLUCAP 102*    Microbiology: Results for orders placed or performed during the hospital encounter of 02/12/2021  Culture, blood (routine x 2)     Status: None (Preliminary result)   Collection Time: 02/12/2021 10:29 AM   Specimen: BLOOD  Result Value Ref Range Status   Specimen Description BLOOD LEFT ANTECUBITAL  Final   Special Requests   Final    BOTTLES DRAWN AEROBIC AND ANAEROBIC Blood Culture results may not be optimal due to an excessive volume of blood received in culture bottles   Culture   Final    NO GROWTH 2 DAYS [facility] [address], [place] [address]   Report Status PENDING  Incomplete  Culture, blood (routine x 2)     Status: None (Preliminary result)   Collection Time: 02/05/2021 10:30 AM   Specimen: BLOOD  Result Value Ref Range Status   Specimen Description BLOOD BLOOD  LEFT FOREARM  Final   Special Requests   Final    BOTTLES DRAWN AEROBIC ONLY Blood Culture adequate volume   Culture   Final    NO GROWTH 2 DAYS Performed at [facility], [place], [place] [address]    Report Status PENDING  Incomplete  SARS CORONAVIRUS 2 (TAT 6-24 HRS) Nasopharyngeal Nasopharyngeal Swab     Status: None   Collection Time: 01/31/2021 12:09 PM   Specimen: Nasopharyngeal Swab  Result Value Ref Range Status   SARS Coronavirus 2 NEGATIVE NEGATIVE Final    Comment: (NOTE) SARS-CoV-2 target nucleic acids are NOT DETECTED.  The SARS-CoV-2 RNA is generally detectable in upper and lower respiratory specimens during the acute phase of infection. Negative results do not preclude SARS-CoV-2 infection, do not rule out co-infections with other pathogens, and should not be used as the sole  basis for treatment or other patient management decisions. Negative results must be combined with clinical observations, patient history, and epidemiological information. The expected result is Negative.  Fact Sheet for Patients: SugarRoll.be  Fact Sheet for Healthcare Providers: https://www.woods-mathews.com/  This test is not yet approved or cleared by the [place] FDA and  has been authorized for detection and/or diagnosis of SARS-CoV-2 by FDA under an Emergency Use Authorization (EUA). This EUA will remain  in effect (meaning this test can be used) for the duration of the COVID-19 declaration under Se ction 564(b)(1) of the Act, 21 U.S.C. section 360bbb-3(b)(1), unless the authorization is terminated or revoked sooner.  Performed at [facility], [place] [address], [place], [place] [address]   Urine Culture     Status: Abnormal   Collection Time: 02/08/2021  4:16 PM   Specimen: Urine, Random  Result Value Ref Range Status   Specimen Description   Final    URINE, RANDOM Performed at [facility], [address] [place], [place] [address]    Special Requests   Final    NONE Performed at [facility], [address] [place], [place] [address]    Culture (A)  Final    >=100,000 COLONIES/mL MULTIPLE SPECIES PRESENT, SUGGEST RECOLLECTION   Report Status 02/09/2021 FINAL  Final  MRSA PCR Screening     Status: None   Collection Time: 02/08/21  6:30 AM   Specimen: Nasopharyngeal  Result Value Ref Range Status   MRSA by PCR NEGATIVE NEGATIVE Final    Comment:        The GeneXpert MRSA Assay (FDA approved for NASAL specimens only), is one component of a comprehensive MRSA colonization surveillance program. It is not intended to diagnose MRSA infection nor to guide or monitor treatment for MRSA infections. Performed at [facility], [address] [place], [place] [address]     Coagulation  Studies: No results for input(s): LABPROT, INR in the last 72 hours.  Urinalysis: Recent Labs    02/04/2021 1616  COLORURINE YELLOW*  LABSPEC 1.010  PHURINE 7.0  GLUCOSEU NEGATIVE  HGBUR MODERATE*  BILIRUBINUR NEGATIVE  KETONESUR NEGATIVE  PROTEINUR 100*  NITRITE NEGATIVE  LEUKOCYTESUR LARGE*      Imaging: US RENAL  Result Date: 02/08/2021 CLINICAL DATA:  Chronic kidney disease stage 4. EXAM: RENAL / URINARY TRACT ULTRASOUND COMPLETE COMPARISON:  May 26, 2020.  February 07, 2021. FINDINGS: Right Kidney: Renal measurements: 10.3 x 6.1 x 4.4 cm = volume: 146 mL. Echogenicity within normal limits. No mass visualized. Moderate hydronephrosis is noted. 8 mm cyst is seen in  midpole. Left Kidney: Renal measurements: 12.0 x 5.3 x 4.1 cm = volume: 137 mL. Echogenicity within normal limits. No mass visualized. Moderate hydronephrosis is noted. 4 cm simple cyst is seen in midpole. Bladder: Multiple large bladder calculi are again noted. Calculated prevoid volume of 10 mL is noted. Patient was unable to void. Other: None. IMPRESSION: Moderate bilateral hydronephrosis is noted. Continued presence of multiple large bladder calculi. Electronically Signed   By: Marijo Conception M.D.   On: 02/08/2021 15:36   DG Chest Port 1 View  Result Date: 02/09/2021 CLINICAL DATA:  Abnormal x-ray. EXAM: PORTABLE CHEST 1 VIEW COMPARISON:  Two days ago FINDINGS: Very gas dilated colon in the upper abdomen. Low volume lungs. There is no edema, consolidation, effusion, or pneumothorax. Normal heart size and mediastinal contours. IMPRESSION: 1. Negative low volume chest. 2. Persistent gas distended colon as evaluated by recent abdominal CT. Electronically Signed   By: Monte Fantasia M.D.   On: 02/09/2021 11:48     Medications:   . sodium chloride 250 mL (02/09/2021 1618)  . norepinephrine (LEVOPHED) Adult infusion 4 mcg/min (02/10/21 0500)  . piperacillin-tazobactam (ZOSYN)  IV 2.25 g (02/10/21 0549)  . sodium  bicarbonate (isotonic) 150 mEq in D5W 1000 mL infusion 75 mL/hr at 02/10/21 0500   . Chlorhexidine Gluconate Cloth  6 each Topical Q0600  . heparin  5,000 Units Subcutaneous Q8H   acetaminophen, docusate sodium, oxyCODONE-acetaminophen, polyethylene glycol  Assessment/ Plan:  Mr. Jeriko Kowalke is a 75 y.o. white male with hypertension, coronary artery disease, congestive heart failure, history of bowel perforation, , who was admitted to [facility] on 01/26/2021 for Cystitis [N30.90]  1. Acute kidney injury with hyperkalemia and metabolic acidosis on admission: on chronic kidney disease stage IV with proteinuria and hematuria: baseline creatinine of 2.57, GFR of 24 on 09/05/2020.  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  No indication for dialysis at this time. If anuric tomorrow, will consider renal replacement therapy Nonoliguric urine output.  - Discontinue sodium bicarbonate infusion due to hypokalemia and metabolic alkalolsis. Start normal saline with potassium chloride.  - moderate hydronephrosis with calculi -chronic. Appreciate urology input.   2. Hypotension: requiring vasopressors: norepinephrine.  Urine culture with mutliple species present on 2/15.  - empiric antibiotics: pip/tazo - start midodrine  3. Hypokalemia:  - PO potassium - NS with KCl as above.    LOS: 3 Saidah Kempton 2/18/20227:13 AM

## 2021-02-10 NOTE — Consult Note (Signed)
[place] for Electrolyte Monitoring and Replacement   Recent Labs: Potassium (mmol/L)  Date Value  02/10/2021 2.9 (L)   Magnesium (mg/dL)  Date Value  02/10/2021 2.1   Calcium (mg/dL)  Date Value  02/10/2021 7.6 (L)   Albumin (g/dL)  Date Value  06/29/2020 4.3   Phosphorus (mg/dL)  Date Value  02/10/2021 2.7   Sodium (mmol/L)  Date Value  02/10/2021 141  06/29/2020 143     Assessment: Pt is a 75 yo presented to the ED on 2/15 due to a syncopal event with loss of consciousness. Suspected sepsis with septic shock due to suspected UTI in the setting of chronic cystitis. His Scr is trending downward (4.92 >> 4.09 >> 4.03 >>3.58 [2/18]). Pharmacy has been consulted to monitor and replenish electrolytes.   MIVF: 0.9% NaCl w/ 20 mEq/L continuous at 75 mL/hr (0.075L x 24hrs x 20 mEq = 36 mEq K+) Supplement: KLOR-CON 20 mEq PO daily Total daily K+ supplement = 56 mEq (10 mEq of K+ raise serum K levels by 0.1, anticipate 0.5-0.6 increase in K+)  Goal of Therapy:  Electrolytes WNL  Plan:  - Continue IV fluids and oral supplement - Will follow K, Mg, and Phos with am labs  Chrystie Nose ,PharmD Student 02/10/2021 9:30 AM

## 2021-02-10 NOTE — Progress Notes (Signed)
NAME:  William Holland, MRN:  [id number], DOB:  09-26-1946, LOS: 3 ADMISSION DATE:  02/06/2021, CONSULTATION DATE:  02/11/2021 REFERRING MD:  Dr. Cherylann Banas, CHIEF COMPLAINT: Abdominal Pain & Syncope  Brief History:  75 yo male presenting to the ED in septic shock due to UTI requiring low dose pressors, admitted to ICU.  History of Present Illness:  75 yo male presented to the ED after a syncopal event with loss of consciousness. The patient's brother reported the patient was sitting, getting his hair cut when he fell over and became unresponsive for about 15 minutes. The patient denied experiencing any symptoms prior to falling over, no dizziness/lightheadness/prodromal symptoms. Only other complaint is mild pain to light palpation of abdomen which is chronically distended. Patient denies fever/chills/cough, does admit to diarrhea but states this is chronic. Patient denies that anything like this has ever happened to him before.   ED course: Initial vitals: hypothermic- 95.4/ NSR- 96/ RR- 13/ hypotensive- 71/52 (60). Labs significant for: leukocytosis-WBC 25.1, hyperkalemic 5.6, Cl 115, serum CO2 14, BUN/Cr 110/4.92, BNP 128.4, troponin 18~20, lactic 1.1, PCT 1.87  Past Medical History:  CAD NICM Diverticulosis Chronic combined systolic & diastolic HF  Significant Hospital Events:  02/20/2021 Admission with suspected UTI, low dose levophed 2/16: Renal US with moderate bilateral hydronephrosis, multiple large bladder calculi 2/17: Urology consulted;recommedns antibiotics and follow up cultures; recommends against aggressive interventions including ureteral stents or nephrostomy tubes 2/18: Weaning Levophed, currently at 3 mcg  Consults:  Nephrology Urology  Procedures:  N/A  Significant Diagnostic Tests:  02/13/2021 CT Abdomen pelvis >>  Findings of chronic cystitis with interval development of moderate bilateral hydronephrosis. There are a few small foci of air within the left renal pelvis.  Findings raise suspicion for ascending urinary tract infection. Correlate with urinalysis. Persistent severe diffuse colonic dilatation with transition point again noted within the mid sigmoid colon. Circumferentially thickened 6 cm segment of mid sigmoid colon containing numerous diverticula. Similar degree of mild adjacent fat stranding. Findings may represent stricture related to chronic diverticulitis. Underlying colonic neoplasm not excluded. Loss of fat plane between the bladder dome and thickened sigmoid colon raises the possibility of a colovesical fistula. 01/24/2021: CT Head>>1. No acute intracranial abnormality. 2/16: Renal US>>Moderate bilateral hydronephrosis is noted. Continued presence of multiple large bladder calculi.  Micro Data:  02/08/2021 COVID 19 >>negative 02/03/2021 BC x 2 >>no growth to date 2/15: Urine>>  >=100,000 COLONIES/mL MULTIPLE SPECIES PRESENT, SUGGEST RECOLLECTIONAbnormal  2/16: MRSA PCR>>negative 2/18: GI panel>> 2/18: C.diff>>  Antimicrobials:  Cefepime 2/15 >>2/17 Flagyl 2/15 >>2/17 Vancomycin x 1 dose 2/15 Zosyn 2/17>>  Interval History:  -No events reported overnight -Urology evaluated pt yesterday evening~ Recommends continuing antibiotics (does not recommend any aggressive intervention with ureteral stents or nephrostomy tubes)  -Pt is awake and alert -Hemodynamically stable, afebrile -Weaning down Levophed (currenlty at 3 mcg); starting Midodrine -Bicarb gtt being discontinued due to metabolic alkalosis -Pt noted to have large volume of diarrhea in rectal tube; will send off for GI panel and C. diff  Objective   Blood pressure (!) 110/47, pulse 89, temperature 98 F (36.7 C), resp. rate 16, height 5' 6"  (1.676 m), weight 67 kg, SpO2 100 %.        Intake/Output Summary (Last 24 hours) at 02/10/2021 1236 Last data filed at 02/10/2021 0549 Gross per 24 hour  Intake 1652.81 ml  Output 825 ml  Net 827.81 ml   Filed Weights   02/10/2021 0912   Weight: 67 kg  Examination: General: Acute on chronically ill appearing male, sitting on bed, on room air, in NAD HEENT: Atraumatic, normocephalic, neck supple, no JVD Neuro: Alert, oriented to person, place, and situation.  Follows commands, no focal deficits, speech clear, pupils PERRL CV: Regular rate and rhythm, s1s2, no M/R/G, 2+ distal pulses Pulm: Clear to auscultation bilaterally, even, nonlabored, normal effort GI: Distended, no tenderness, no guarding or rebound tenderness, BS+ x4 Skin: Warm and dry.  No obvious rashes, lesions, or ulcerations Extremities: No deformities, trace edema bilateral LE  Resolved Hospital Problem list   N/A  Assessment & Plan:   Septic Shock in the setting of UTI HFrEF- LVEF 30-35% Hx: NICM, CAD -Continuous cardiac monitoring -Maintain MAP greater than 65 -Gentle IV fluids -Levophed as needed to maintain MAP goal -Start midodrine - home medications on hold in the setting of hypotension & AKI on CKD: metoprolol, losartan, torsemide. Consider restarting as patient stabilizes   Severe Sepsis Due to suspected UTI in the setting of chronic cystitis Lactic: 1.1, Baseline PCT: 1.87, UA: pending, CXR: chronic without acute disease, CT Abdomen: concern for UTI Initial interventions/workup included: 2.25 L of LR & Cefepime/ Vancomycin/ Metronidazole -Monitor fever curve -Trend WBC and procalcitonin -Follow cultures as above -Continue IV Zosyn pending cultures and sensitiviites   Acute Kidney Injury on CKD Stage IV (In the setting of shock. Baseline creatinine 2.36-2.57, creatinine on admission 4.92) Bilateral Hydronephrosis Hypokalemia Metabolic Acidosis>>resolved Metabolic Alkalosis -Monitor I&O's / urinary output -Follow BMP -Ensure adequate renal perfusion -Avoid nephrotoxic agents as able -Replace electrolytes as indicated -Nephrology following, appreciate input -Bicarb gtt d/c by Nephrology due to metabolic alkalosis -Starting NS  w/ KCL -Urology consulted, did not recommend any aggressive intervention with ureteral stents or nephrostomy tubes   Anemia -Monitor for S/Sx of bleeding -Trend CBC -Heparin SQ for VTE Prophylaxis  -Transfuse for Hgb <7      Best practice (evaluated daily)  Diet: Heart healthy Pain/Anxiety/Delirium protocol (if indicated): N/a VAP protocol (if indicated): n/a DVT prophylaxis: heparin SQ GI prophylaxis: N/a Glucose control: monitor as needed Mobility: bedrest, mobilize as tolerated Disposition:ICU  Goals of Care:  Last date of multidisciplinary goals of care discussion:02/10/21 Family and staff present: Pt and bedside RN; no family available Summary of discussion: plan of care including antibiotics & vasopressors Follow up goals of care discussion due: 02/11/21 Code Status: FULL  Labs   CBC: Recent Labs  Lab 02/01/2021 0924 02/08/21 0520 02/09/21 0638 02/10/21 0611  WBC 25.1* 9.1 7.6 8.9  HGB 10.6* 7.6* 7.8* 7.4*  HCT 34.5* 23.8* 23.8* 22.6*  MCV 94.3 92.2 89.8 90.8  PLT 269 213 195 397    Basic Metabolic Panel: Recent Labs  Lab 01/26/2021 0924 01/31/2021 1209 02/09/2021 2007 02/08/21 0410 02/09/21 0638 02/10/21 0611  NA 140  --   --  140 141 141  K 5.6* 5.4* 5.0 4.4 3.5 2.9*  CL 115*  --   --  115* 109 105  CO2 14*  --   --  17* 21* 26  GLUCOSE 107*  --   --  116* 124* 120*  BUN 110*  --   --  102* 95* 84*  CREATININE 4.92*  --   --  4.09* 4.03* 3.58*  CALCIUM 8.8*  --   --  8.1* 7.9* 7.6*  MG  --   --   --  1.8 2.3 2.1  PHOS  --   --   --  3.9 3.1 2.7   GFR: Estimated Creatinine Clearance:  16.3 mL/min (A) (by C-G formula based on SCr of 3.58 mg/dL (H)). Recent Labs  Lab 02/02/2021 0924 01/27/2021 1029 01/31/2021 1030 02/11/2021 1703 02/08/21 0520 02/09/21 6073 02/10/21 0611  PROCALCITON  --  1.87  --   --   --   --   --   WBC 25.1*  --   --   --  9.1 7.6 8.9  LATICACIDVEN  --   --  1.1 0.7  --   --   --     Liver Function Tests: No results for  input(s): AST, ALT, ALKPHOS, BILITOT, PROT, ALBUMIN in the last 168 hours. No results for input(s): LIPASE, AMYLASE in the last 168 hours. No results for input(s): AMMONIA in the last 168 hours.  ABG No results found for: PHART, PCO2ART, PO2ART, HCO3, TCO2, ACIDBASEDEF, O2SAT   Coagulation Profile: No results for input(s): INR, PROTIME in the last 168 hours.  Cardiac Enzymes: No results for input(s): CKTOTAL, CKMB, CKMBINDEX, TROPONINI in the last 168 hours.  HbA1C: Hgb A1c MFr Bld  Date/Time Value Ref Range Status  01/05/2019 04:37 PM 5.2 4.8 - 5.6 % Final    Comment:    (NOTE) Pre diabetes:          5.7%-6.4% Diabetes:              >6.4% Glycemic control for   <7.0% adults with diabetes     CBG: Recent Labs  Lab 02/08/21 0628  GLUCAP 102*    Review of Systems: POSITIVES in BOLD  Gen: Denies fever, chills, weight change, fatigue, night sweats HEENT: Denies blurred vision, double vision, hearing loss, tinnitus, sinus congestion, rhinorrhea, sore throat, neck stiffness, dysphagia PULM: Denies shortness of breath, cough, sputum production, hemoptysis, wheezing CV: Denies chest pain, edema, orthopnea, paroxysmal nocturnal dyspnea, palpitations GI: Denies abdominal pain, nausea, vomiting, diarrhea, hematochezia, melena, constipation, change in bowel habits GU: Denies dysuria, hematuria, polyuria, oliguria, urethral discharge Endocrine: Denies hot or cold intolerance, polyuria, polyphagia or appetite change Derm: Denies rash, dry skin, scaling or peeling skin change Heme: Denies easy bruising, bleeding, bleeding gums Neuro: Denies headache, numbness, weakness, slurred speech, loss of memory or consciousness  Past Medical History:  He,  has a past medical history of Anemia, Bowel perforation (HCC), Chronic combined systolic (congestive) and diastolic (congestive) heart failure (HCC), Chronic diarrhea, Diverticulosis, sigmoid (02/27/2018), NICM (nonischemic cardiomyopathy)  ([place]), Nonobstructive CAD (coronary artery disease), and Stuttering during school years.   Surgical History:   Past Surgical History:  Procedure Laterality Date  . APPENDECTOMY    . COLONOSCOPY WITH PROPOFOL N/A 02/27/2018   Procedure: COLONOSCOPY WITH PROPOFOL;  Surgeon: Lin Landsman, MD;  Location: [facility] ENDOSCOPY;  Service: Gastroenterology;  Laterality: N/A;  . EYE SURGERY    . RIGHT/LEFT HEART CATH AND CORONARY ANGIOGRAPHY N/A 02/09/2019   Procedure: RIGHT/LEFT HEART CATH AND CORONARY ANGIOGRAPHY;  Surgeon: Wellington Hampshire, MD;  Location: [place] [facility];  Service: Cardiovascular;  Laterality: N/A;     Social History:   reports that he has never smoked. He has never used smokeless tobacco. He reports that he does not drink alcohol and does not use drugs.   Family History:  His family history includes Cancer in his brother and father; Uterine cancer in his mother.   Allergies No Known Allergies   Home Medications  Prior to Admission medications   Medication Sig Start Date End Date Taking? Authorizing Provider  losartan (COZAAR) 25 MG tablet Take 12.5 mg by mouth daily. 07/17/20  Yes [provider]  metoprolol succinate (TOPROL XL) 25 MG 24 hr tablet Take 0.5 tablets (12.5 mg total) by mouth daily. 01/23/21  Yes Wellington Hampshire, MD  potassium chloride (KLOR-CON) 10 MEQ tablet Take 2 tablets (20 mEq total) by mouth daily as needed (when taking fluid pill). Patient taking differently: Take 20 mEq by mouth daily. 07/15/20  Yes Dunn, Areta Haber, PA-C  torsemide (DEMADEX) 20 MG tablet Take 1 tablet (20 mg total) each morning. Take additional dose (20 mg total) on Monday and Thursday afternoons Patient taking differently: Take 20 mg by mouth daily. 09/01/20  Yes Rise Mu, PA-C     Critical care time: 30 minutes    Darel Hong, [facility] Pager: [phone]

## 2021-02-11 DIAGNOSIS — R6521 Severe sepsis with septic shock: Secondary | ICD-10-CM | POA: Diagnosis not present

## 2021-02-11 DIAGNOSIS — A419 Sepsis, unspecified organism: Secondary | ICD-10-CM | POA: Diagnosis not present

## 2021-02-11 LAB — CORTISOL: Cortisol, Plasma: 11.2 ug/dL

## 2021-02-11 LAB — HEPATIC FUNCTION PANEL
ALT: 8 U/L (ref 0–44)
AST: 13 U/L — ABNORMAL LOW (ref 15–41)
Albumin: 2.1 g/dL — ABNORMAL LOW (ref 3.5–5.0)
Alkaline Phosphatase: 43 U/L (ref 38–126)
Bilirubin, Direct: <0.1 mg/dL (ref 0.0–0.2)
Total Bilirubin: 0.5 mg/dL (ref 0.3–1.2)
Total Protein: 5.5 g/dL — ABNORMAL LOW (ref 6.5–8.1)

## 2021-02-11 LAB — BASIC METABOLIC PANEL
Anion gap: 11 (ref 5–15)
BUN: 72 mg/dL — ABNORMAL HIGH (ref 8–23)
CO2: 22 mmol/L (ref 22–32)
Calcium: 7.8 mg/dL — ABNORMAL LOW (ref 8.9–10.3)
Chloride: 107 mmol/L (ref 98–111)
Creatinine, Ser: 3.35 mg/dL — ABNORMAL HIGH (ref 0.61–1.24)
GFR, Estimated: 19 mL/min — ABNORMAL LOW (ref >60)
Glucose, Bld: 98 mg/dL (ref 70–99)
Potassium: 3.5 mmol/L (ref 3.5–5.1)
Sodium: 140 mmol/L (ref 135–145)

## 2021-02-11 LAB — CBC
HCT: 24.5 % — ABNORMAL LOW (ref 39.0–52.0)
Hemoglobin: 7.9 g/dL — ABNORMAL LOW (ref 13.0–17.0)
MCH: 29.7 pg (ref 26.0–34.0)
MCHC: 32.2 g/dL (ref 30.0–36.0)
MCV: 92.1 fL (ref 80.0–100.0)
Platelets: 210 10*3/uL (ref 150–400)
RBC: 2.66 MIL/uL — ABNORMAL LOW (ref 4.22–5.81)
RDW: 14.6 % (ref 11.5–15.5)
WBC: 10 10*3/uL (ref 4.0–10.5)
nRBC: 0 % (ref 0.0–0.2)

## 2021-02-11 LAB — MAGNESIUM: Magnesium: 2 mg/dL (ref 1.7–2.4)

## 2021-02-11 LAB — TSH: TSH: 9.773 u[IU]/mL — ABNORMAL HIGH (ref 0.350–4.500)

## 2021-02-11 LAB — POTASSIUM: Potassium: 4.4 mmol/L (ref 3.5–5.1)

## 2021-02-11 LAB — PHOSPHORUS: Phosphorus: 2.9 mg/dL (ref 2.5–4.6)

## 2021-02-11 LAB — T4, FREE: Free T4: 0.54 ng/dL — ABNORMAL LOW (ref 0.61–1.12)

## 2021-02-11 MED ORDER — LEVOTHYROXINE SODIUM 100 MCG/5ML IV SOLN
12.5000 ug | Freq: Every day | INTRAVENOUS | Status: DC
  Administered 2021-02-11: 12.5 ug via INTRAVENOUS
  Filled 2021-02-11 (×2): qty 5

## 2021-02-11 MED ORDER — LEVOTHYROXINE SODIUM 100 MCG/5ML IV SOLN: 12.5000 ug | Freq: Every day | INTRAVENOUS | Status: DC

## 2021-02-11 MED ORDER — SODIUM CHLORIDE 0.9 % IV BOLUS
500.0000 mL | Freq: Once | INTRAVENOUS | Status: AC
  Administered 2021-02-11: 500 mL via INTRAVENOUS

## 2021-02-11 MED ORDER — POTASSIUM CHLORIDE CRYS ER 20 MEQ PO TBCR
20.0000 meq | EXTENDED_RELEASE_TABLET | Freq: Once | ORAL | Status: AC
  Administered 2021-02-11: 20 meq via ORAL
  Filled 2021-02-11: qty 1

## 2021-02-11 MED ORDER — MIDODRINE HCL 5 MG PO TABS
10.0000 mg | ORAL_TABLET | Freq: Three times a day (TID) | ORAL | Status: DC
  Administered 2021-02-11 – 2021-02-18 (×22): 10 mg via ORAL
  Filled 2021-02-11 (×22): qty 2

## 2021-02-11 MED ORDER — CHLORHEXIDINE GLUCONATE CLOTH 2 % EX PADS
6.0000 | MEDICATED_PAD | Freq: Every day | CUTANEOUS | Status: DC
  Administered 2021-02-12 – 2021-02-17 (×6): 6 via TOPICAL

## 2021-02-11 MED ORDER — ALBUMIN HUMAN 25 % IV SOLN
12.5000 g | Freq: Two times a day (BID) | INTRAVENOUS | Status: AC
  Administered 2021-02-11 – 2021-02-13 (×5): 12.5 g via INTRAVENOUS
  Filled 2021-02-11 (×5): qty 50

## 2021-02-11 NOTE — Plan of Care (Signed)
Pt remains neuro intact, Norepi titrated as needed, pt continues to have significant stool output see I/O in epic. No significant events overnight.  Handoff report given to oncoming RN. Relinquishing care.  Problem: Education: Goal: Knowledge of General Education information will improve Description: Including pain rating scale, medication(s)/side effects and non-pharmacologic comfort measures Outcome: Progressing   Problem: Health Behavior/Discharge Planning: Goal: Ability to manage health-related needs will improve Outcome: Progressing   Problem: Clinical Measurements: Goal: Ability to maintain clinical measurements within normal limits will improve Outcome: Progressing Goal: Will remain free from infection Outcome: Progressing Goal: Diagnostic test results will improve Outcome: Progressing Goal: Respiratory complications will improve Outcome: Progressing Goal: Cardiovascular complication will be avoided Outcome: Progressing   Problem: Activity: Goal: Risk for activity intolerance will decrease Outcome: Progressing   Problem: Nutrition: Goal: Adequate nutrition will be maintained Outcome: Progressing   Problem: Coping: Goal: Level of anxiety will decrease Outcome: Progressing   Problem: Elimination: Goal: Will not experience complications related to bowel motility Outcome: Progressing Goal: Will not experience complications related to urinary retention Outcome: Progressing   Problem: Pain Managment: Goal: General experience of comfort will improve Outcome: Progressing   Problem: Safety: Goal: Ability to remain free from injury will improve Outcome: Progressing   Problem: Skin Integrity: Goal: Risk for impaired skin integrity will decrease Outcome: Progressing

## 2021-02-11 NOTE — Progress Notes (Signed)
[facility]  ROUNDING NOTE   Subjective:   Ate 100% of breakfast Denies acute c.o   Objective:  Vital signs in last 24 hours:  Temp:  [97.2 F (36.2 C)-98.4 F (36.9 C)] 97.2 F (36.2 C) (02/19 0400) Pulse Rate:  [45-105] 66 (02/19 0700) Resp:  [10-32] 15 (02/19 0700) BP: (70-141)/(38-89) 116/59 (02/19 0700) SpO2:  [96 %-100 %] 96 % (02/19 0700) Weight:  [73 kg] 73 kg (02/19 0500)  Weight change:  Filed Weights   02/20/2021 0912 02/11/21 0500  Weight: 67 kg 73 kg    Intake/Output: I/O last 3 completed shifts: In: 2836.8 [I.V.:2640.2; IV Piggyback:196.6] Out: 1937 [Urine:700; Stool:2375]   Intake/Output this shift:  No intake/output data recorded.  Physical Exam: General: Ill appearing  Head: Normocephalic, atraumatic. Moist oral mucosal membranes  Lungs:  Clear to auscultation  Heart: Regular rate and rhythm  Abdomen:  Soft, nontender, +distended  Extremities:  no peripheral edema.  Neurologic: Nonfocal, moving all four extremities  Skin: No lesions  Access:   Rectal tube External using collection system  Basic Metabolic Panel: Recent Labs  Lab 02/06/2021 0924 02/05/2021 1209 02/08/21 0410 02/09/21 9024 02/10/21 0611 02/10/21 2040 02/11/21 0624  NA 140  --  140 141 141  --  140  K 5.6*   < > 4.4 3.5 2.9* 3.7 3.5  CL 115*  --  115* 109 105  --  107  CO2 14*  --  17* 21* 26  --  22  GLUCOSE 107*  --  116* 124* 120*  --  98  BUN 110*  --  102* 95* 84*  --  72*  CREATININE 4.92*  --  4.09* 4.03* 3.58*  --  3.35*  CALCIUM 8.8*  --  8.1* 7.9* 7.6*  --  7.8*  MG  --   --  1.8 2.3 2.1  --  2.0  PHOS  --   --  3.9 3.1 2.7  --  2.9   < > = values in this interval not displayed.    Liver Function Tests: No results for input(s): AST, ALT, ALKPHOS, BILITOT, PROT, ALBUMIN in the last 168 hours. No results for input(s): LIPASE, AMYLASE in the last 168 hours. No results for input(s): AMMONIA in the last 168 hours.  CBC: Recent Labs  Lab  01/31/2021 0924 02/08/21 0520 02/09/21 0973 02/10/21 0611 02/11/21 0624  WBC 25.1* 9.1 7.6 8.9 10.0  HGB 10.6* 7.6* 7.8* 7.4* 7.9*  HCT 34.5* 23.8* 23.8* 22.6* 24.5*  MCV 94.3 92.2 89.8 90.8 92.1  PLT 269 213 195 201 210    Cardiac Enzymes: No results for input(s): CKTOTAL, CKMB, CKMBINDEX, TROPONINI in the last 168 hours.  BNP: Invalid input(s): POCBNP  CBG: Recent Labs  Lab 02/08/21 0628  GLUCAP 102*    Microbiology: Results for orders placed or performed during the hospital encounter of 02/20/2021  Culture, blood (routine x 2)     Status: None (Preliminary result)   Collection Time: 02/02/2021 10:29 AM   Specimen: BLOOD  Result Value Ref Range Status   Specimen Description BLOOD LEFT ANTECUBITAL  Final   Special Requests   Final    BOTTLES DRAWN AEROBIC AND ANAEROBIC Blood Culture results may not be optimal due to an excessive volume of blood received in culture bottles   Culture   Final    NO GROWTH 4 DAYS Performed at [facility], [address] [place], [place] [address]    Report Status PENDING  Incomplete  Culture, blood (routine x 2)     Status: None (Preliminary result)   Collection Time: 02/14/2021 10:30 AM   Specimen: BLOOD  Result Value Ref Range Status   Specimen Description BLOOD BLOOD LEFT FOREARM  Final   Special Requests   Final    BOTTLES DRAWN AEROBIC ONLY Blood Culture adequate volume   Culture   Final    NO GROWTH 4 DAYS Performed at [facility] [address] [place], [place] [address]    Report Status PENDING  Incomplete  SARS CORONAVIRUS 2 (TAT 6-24 HRS) Nasopharyngeal Nasopharyngeal Swab     Status: None   Collection Time: 02/16/2021 12:09 PM   Specimen: Nasopharyngeal Swab  Result Value Ref Range Status   SARS Coronavirus 2 NEGATIVE NEGATIVE Final    Comment: (NOTE) SARS-CoV-2 target nucleic acids are NOT DETECTED.  The SARS-CoV-2 RNA is generally detectable in upper and lower respiratory specimens during the acute  phase of infection. Negative results do not preclude SARS-CoV-2 infection, do not rule out co-infections with other pathogens, and should not be used as the sole basis for treatment or other patient management decisions. Negative results must be combined with clinical observations, patient history, and epidemiological information. The expected result is Negative.  Fact Sheet for Patients: SugarRoll.be  Fact Sheet for Healthcare Providers: https://www.woods-mathews.com/  This test is not yet approved or cleared by the [place] FDA and  has been authorized for detection and/or diagnosis of SARS-CoV-2 by FDA under an Emergency Use Authorization (EUA). This EUA will remain  in effect (meaning this test can be used) for the duration of the COVID-19 declaration under Se ction 564(b)(1) of the Act, 21 U.S.C. section 360bbb-3(b)(1), unless the authorization is terminated or revoked sooner.  Performed at [facility], [place] [address] [place], [place] [address]   Urine Culture     Status: Abnormal   Collection Time: 02/13/2021  4:16 PM   Specimen: Urine, Random  Result Value Ref Range Status   Specimen Description   Final    URINE, RANDOM Performed at [facility], [address], [place], [place] [address]    Special Requests   Final    NONE Performed at [facility], [address] [place], [place] [address]    Culture (A)  Final    >=100,000 COLONIES/mL MULTIPLE SPECIES PRESENT, SUGGEST RECOLLECTION   Report Status 02/09/2021 FINAL  Final  MRSA PCR Screening     Status: None   Collection Time: 02/08/21  6:30 AM   Specimen: Nasopharyngeal  Result Value Ref Range Status   MRSA by PCR NEGATIVE NEGATIVE Final    Comment:        The GeneXpert MRSA Assay (FDA approved for NASAL specimens only), is one component of a comprehensive MRSA colonization surveillance program. It is not intended to diagnose  MRSA infection nor to guide or monitor treatment for MRSA infections. Performed at [facility], [address] [place], [place] [address]   Gastrointestinal Panel by PCR , Stool     Status: None   Collection Time: 02/10/21 12:36 PM   Specimen: Stool  Result Value Ref Range Status   Campylobacter species NOT DETECTED NOT DETECTED Final   Plesimonas shigelloides NOT DETECTED NOT DETECTED Final   Salmonella species NOT DETECTED NOT DETECTED Final   Yersinia enterocolitica NOT DETECTED NOT DETECTED Final   Vibrio species NOT DETECTED NOT DETECTED Final   Vibrio cholerae NOT DETECTED NOT DETECTED Final   Enteroaggregative  E coli (EAEC) NOT DETECTED NOT DETECTED Final   Enteropathogenic E coli (EPEC) NOT DETECTED NOT DETECTED Final   Enterotoxigenic E coli (ETEC) NOT DETECTED NOT DETECTED Final   Shiga like toxin producing E coli (STEC) NOT DETECTED NOT DETECTED Final   Shigella/Enteroinvasive E coli (EIEC) NOT DETECTED NOT DETECTED Final   Cryptosporidium NOT DETECTED NOT DETECTED Final   Cyclospora cayetanensis NOT DETECTED NOT DETECTED Final   Entamoeba histolytica NOT DETECTED NOT DETECTED Final   Giardia lamblia NOT DETECTED NOT DETECTED Final   Adenovirus F40/41 NOT DETECTED NOT DETECTED Final   Astrovirus NOT DETECTED NOT DETECTED Final   Norovirus GI/GII NOT DETECTED NOT DETECTED Final   Rotavirus A NOT DETECTED NOT DETECTED Final   Sapovirus (I, II, IV, and V) NOT DETECTED NOT DETECTED Final    Comment: Performed at [facility], [address] [place], [place] [address]  C Difficile Quick Screen (NO PCR Reflex)     Status: None   Collection Time: 02/10/21 12:36 PM   Specimen: STOOL  Result Value Ref Range Status   C Diff antigen NEGATIVE NEGATIVE Final   C Diff toxin NEGATIVE NEGATIVE Final   C Diff interpretation No C. difficile detected.  Final    Comment: Performed at [facility], [address] [place], [place] [address]     Coagulation Studies: No results for input(s): LABPROT, INR in the last 72 hours.  Urinalysis: No results for input(s): COLORURINE, LABSPEC, PHURINE, GLUCOSEU, HGBUR, BILIRUBINUR, KETONESUR, PROTEINUR, UROBILINOGEN, NITRITE, LEUKOCYTESUR in the last 72 hours.  Invalid input(s): APPERANCEUR    Imaging: DG Chest Port 1 View  Result Date: 02/09/2021 CLINICAL DATA:  Abnormal x-ray. EXAM: PORTABLE CHEST 1 VIEW COMPARISON:  Two days ago FINDINGS: Very gas dilated colon in the upper abdomen. Low volume lungs. There is no edema, consolidation, effusion, or pneumothorax. Normal heart size and mediastinal contours. IMPRESSION: 1. Negative low volume chest. 2. Persistent gas distended colon as evaluated by recent abdominal CT. Electronically Signed   By: Monte Fantasia M.D.   On: 02/09/2021 11:48     Medications:   . sodium chloride 250 mL (01/30/2021 1618)  . 0.9 % NaCl with KCl 20 mEq / L 75 mL/hr at 02/11/21 0500  . norepinephrine (LEVOPHED) Adult infusion 5 mcg/min (02/11/21 0851)  . piperacillin-tazobactam (ZOSYN)  IV 2.25 g (02/11/21 0615)   . Chlorhexidine Gluconate Cloth  6 each Topical Q0600  . heparin  5,000 Units Subcutaneous Q8H  . midodrine  5 mg Oral TID WC  . potassium chloride  20 mEq Oral Daily  . potassium chloride  20 mEq Oral Once   acetaminophen, docusate sodium, oxyCODONE-acetaminophen, polyethylene glycol  Assessment/ Plan:  [name] is a 75 y.o. white male with hypertension, coronary artery disease, congestive heart failure, history of bowel perforation, , who was admitted to [facility] on 02/15/2021 for Cystitis [N30.90]  1. Acute kidney injury with hyperkalemia and metabolic acidosis on admission: on chronic kidney disease stage IV with proteinuria and hematuria:  baseline creatinine of 2.57, GFR of 24 on 09/05/2020.  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  CT abdomen 2/15- chronic cystitis with moderate bilateral hydronephrosis with  bladder calculi -chronic. Appreciate urology input.   02/18 0701 - 02/19 0700 In: 1864.9 [I.V.:1768.3; IV Piggyback:96.6] Out: 2250 [Urine:350; Stool:1900]   Lab Results  Component Value Date   CREATININE 3.35 (H) 02/11/2021   CREATININE 3.58 (H) 02/10/2021   CREATININE 4.03 (H) 02/09/2021   S  Creatinine trend appears to be stabilizing Electrolytes and Volume status are acceptable No acute indication for Dialysis at present   2. Hypotension: requiring vasopressors: norepinephrine.  Urine culture with mutliple species present on 2/15.  - empiric antibiotics: pip/tazo    3. Hypokalemia:  - PO potassium - NS with KCl as above.    LOS: 4 Shericka Johnstone 2/19/20229:03 AM

## 2021-02-11 NOTE — Progress Notes (Signed)
NAME:  William Holland, MRN:  [id number], DOB:  [birth year], LOS: 4 ADMISSION DATE:  02/17/2021, INITIAL CONSULTATION DATE:  02/10/2021 REFERRING MD:  Dr. Cherylann Banas, CHIEF COMPLAINT: Abdominal Pain & Syncope  Brief History:  75 yo male presenting to the ED in septic shock due to UTI requiring low dose pressors, admitted to ICU.  History of Present Illness:  75 yo male presented to the ED after a syncopal event with loss of consciousness. The patient's brother reported the patient was sitting, getting his hair cut when he fell over and became unresponsive for about 15 minutes. The patient denied experiencing any symptoms prior to falling over, no dizziness/lightheadness/prodromal symptoms. Only other complaint is mild pain to light palpation of abdomen which is chronically distended. Patient denies fever/chills/cough, does admit to diarrhea but states this is chronic. Patient denies that anything like this has ever happened to him before.   ED course: Initial vitals: hypothermic- 95.4/ NSR- 96/ RR- 13/ hypotensive- 71/52 (60). Labs significant for: leukocytosis-WBC 25.1, hyperkalemic 5.6, Cl 115, serum CO2 14, BUN/Cr 110/4.92, BNP 128.4, troponin 18~20, lactic 1.1, PCT 1.87  Past Medical History:  CAD NICM Diverticulosis Chronic combined systolic & diastolic HF  Significant Hospital Events:  01/24/2021 Admission with suspected UTI, low dose levophed 2/16: Renal US with moderate bilateral hydronephrosis, multiple large bladder calculi 2/17: Urology consulted;recommedns antibiotics and follow up cultures; recommends against aggressive interventions including ureteral stents or nephrostomy tubes 2/18: Weaning Levophed, currently at 3 mcg  Consults:  Nephrology Urology  Procedures:  N/A  Significant Diagnostic Tests:  02/12/2021 CT Abdomen pelvis >>  Findings of chronic cystitis with interval development of moderate bilateral hydronephrosis. There are a few small foci of air within the left renal  pelvis. Findings raise suspicion for ascending urinary tract infection. Correlate with urinalysis. Persistent severe diffuse colonic dilatation with transition point again noted within the mid sigmoid colon. Circumferentially thickened 6 cm segment of mid sigmoid colon containing numerous diverticula. Similar degree of mild adjacent fat stranding. Findings may represent stricture related to chronic diverticulitis. Underlying colonic neoplasm not excluded. Loss of fat plane between the bladder dome and thickened sigmoid colon raises the possibility of a colovesical fistula. 01/28/2021: CT Head>>1. No acute intracranial abnormality. 2/16: Renal US>>Moderate bilateral hydronephrosis is noted. Continued presence of multiple large bladder calculi.  Micro Data:  02/14/2021 COVID 19 >>negative 02/11/2021 BC x 2 >>no growth to date 2/15: Urine>>  >=100,000 COLONIES/mL MULTIPLE SPECIES PRESENT, SUGGEST RECOLLECTIONAbnormal  2/16: MRSA PCR>>negative 2/18: GI panel>> 2/18: C.diff>>  Antimicrobials:  Cefepime 2/15 >>2/17 Flagyl 2/15 >>2/17 Vancomycin x 1 dose 2/15 Zosyn 2/17>>  Interval History:  -No events reported overnight -Pt is awake and alert -Hemodynamically stable, afebrile -Weaning down Levophed (currenlty at 3 mcg); starting Midodrine -Bicarb gtt being discontinued due to metabolic alkalosis -Pt noted to have large volume of diarrhea in rectal tube;  GI panel and C. Diff negative  Objective   Blood pressure (!) 104/47, pulse (!) 54, temperature 98 F (36.7 C), temperature source Axillary, resp. rate 14, height 5' 6"  (1.676 m), weight 73 kg, SpO2 100 %.        Intake/Output Summary (Last 24 hours) at 02/11/2021 1100 Last data filed at 02/11/2021 0930 Gross per 24 hour  Intake 2336.63 ml  Output 2250 ml  Net 86.63 ml   Filed Weights   02/17/2021 0912 02/11/21 0500  Weight: 67 kg 73 kg    Examination: General: Acute on chronically ill appearing male, sitting on bed, on room  air, in  NAD HEENT: Atraumatic, normocephalic, neck supple, no JVD Neuro: Alert, oriented to person, place, and situation.  Follows commands, no focal deficits, speech clear, pupils PERRL CV: Regular rate and rhythm, s1s2, no M/R/G, 2+ distal pulses Pulm: Clear to auscultation bilaterally, even, nonlabored, normal effort GI: Distended, no tenderness, no guarding or rebound tenderness, BS+ x4 Skin: Warm and dry.  No obvious rashes, lesions, or ulcerations Extremities: No deformities, trace edema bilateral LE  Resolved Hospital Problem list   N/A  Assessment & Plan:   Septic Shock in the setting of UTI HFrEF- LVEF 30-35% Hx: NICM, CAD -Continuous cardiac monitoring -Maintain MAP greater than 65 -Gentle IV fluids -Levophed as needed to maintain MAP goal -Start midodrine - home medications on hold in the setting of hypotension & AKI on CKD: metoprolol, losartan, torsemide. Consider restarting as patient stabilizes - Check thyroid function, cortisol   Severe Sepsis Due to suspected UTI in the setting of chronic cystitis Lactic: 1.1, Baseline PCT: 1.87, UA: pending, CXR: chronic without acute disease, CT Abdomen: concern for UTI Initial interventions/workup included: 2.25 L of LR & Cefepime/ Vancomycin/ Metronidazole -Monitor fever curve -Trend WBC and procalcitonin -Follow cultures as above -Continue IV Zosyn pending cultures and sensitiviites   Acute Kidney Injury on CKD Stage IV (In the setting of shock. Baseline creatinine 2.36-2.57, creatinine on admission 4.92) Bilateral Hydronephrosis Hypokalemia Metabolic Acidosis>>resolved Metabolic Alkalosis -Monitor I&O's / urinary output -Follow BMP -Ensure adequate renal perfusion -Avoid nephrotoxic agents as able -Replace electrolytes as indicated -Nephrology following, appreciate input -Bicarb gtt d/c by Nephrology due to metabolic alkalosis -Starting NS w/ KCL -Urology consulted, did not recommend any aggressive intervention with  ureteral stents or nephrostomy tubes   Anemia -Monitor for S/Sx of bleeding -Trend CBC -Heparin SQ for VTE Prophylaxis  -Transfuse for Hgb <7      Best practice (evaluated daily)  Diet: Heart healthy Pain/Anxiety/Delirium protocol (if indicated): N/a VAP protocol (if indicated): n/a DVT prophylaxis: heparin SQ GI prophylaxis: N/a Glucose control: monitor as needed Mobility: bedrest, mobilize as tolerated Disposition:ICU  Goals of Care:  Last date of multidisciplinary goals of care discussion:02/10/21 Family and staff present: Pt and bedside RN; no family available Summary of discussion: plan of care including antibiotics & vasopressors Follow up goals of care discussion due: 02/11/21 Code Status: FULL  Labs   CBC: Recent Labs  Lab 01/31/2021 0924 02/08/21 0520 02/09/21 0638 02/10/21 0611 02/11/21 0624  WBC 25.1* 9.1 7.6 8.9 10.0  HGB 10.6* 7.6* 7.8* 7.4* 7.9*  HCT 34.5* 23.8* 23.8* 22.6* 24.5*  MCV 94.3 92.2 89.8 90.8 92.1  PLT 269 213 195 201 528    Basic Metabolic Panel: Recent Labs  Lab 01/31/2021 0924 02/09/2021 1209 02/08/21 0410 02/09/21 0638 02/10/21 0611 02/10/21 2040 02/11/21 0624  NA 140  --  140 141 141  --  140  K 5.6*   < > 4.4 3.5 2.9* 3.7 3.5  CL 115*  --  115* 109 105  --  107  CO2 14*  --  17* 21* 26  --  22  GLUCOSE 107*  --  116* 124* 120*  --  98  BUN 110*  --  102* 95* 84*  --  72*  CREATININE 4.92*  --  4.09* 4.03* 3.58*  --  3.35*  CALCIUM 8.8*  --  8.1* 7.9* 7.6*  --  7.8*  MG  --   --  1.8 2.3 2.1  --  2.0  PHOS  --   --  3.9 3.1 2.7  --  2.9   < > = values in this interval not displayed.   GFR: Estimated Creatinine Clearance: 17.5 mL/min (A) (by C-G formula based on SCr of 3.35 mg/dL (H)). Recent Labs  Lab 02/08/2021 1029 02/08/2021 1030 02/03/2021 1703 02/08/21 0520 02/09/21 8250 02/10/21 0611 02/11/21 0624  PROCALCITON 1.87  --   --   --   --   --   --   WBC  --   --   --  9.1 7.6 8.9 10.0  LATICACIDVEN  --  1.1 0.7  --    --   --   --     Liver Function Tests: No results for input(s): AST, ALT, ALKPHOS, BILITOT, PROT, ALBUMIN in the last 168 hours. No results for input(s): LIPASE, AMYLASE in the last 168 hours. No results for input(s): AMMONIA in the last 168 hours.  ABG No results found for: PHART, PCO2ART, PO2ART, HCO3, TCO2, ACIDBASEDEF, O2SAT   Coagulation Profile: No results for input(s): INR, PROTIME in the last 168 hours.  Cardiac Enzymes: No results for input(s): CKTOTAL, CKMB, CKMBINDEX, TROPONINI in the last 168 hours.  HbA1C: Hgb A1c MFr Bld  Date/Time Value Ref Range Status  01/05/2019 04:37 PM 5.2 4.8 - 5.6 % Final    Comment:    (NOTE) Pre diabetes:          5.7%-6.4% Diabetes:              >6.4% Glycemic control for   <7.0% adults with diabetes     CBG: Recent Labs  Lab 02/08/21 0628  GLUCAP 102*    Review of Systems: POSITIVES in BOLD  Gen: Denies fever, chills, weight change, fatigue, night sweats HEENT: Denies blurred vision, double vision, hearing loss, tinnitus, sinus congestion, rhinorrhea, sore throat, neck stiffness, dysphagia PULM: Denies shortness of breath, cough, sputum production, hemoptysis, wheezing CV: Denies chest pain, edema, orthopnea, paroxysmal nocturnal dyspnea, palpitations GI: Denies abdominal pain, nausea, vomiting, diarrhea, hematochezia, melena, constipation, change in bowel habits GU: Denies dysuria, hematuria, polyuria, oliguria, urethral discharge Endocrine: Denies hot or cold intolerance, polyuria, polyphagia or appetite change Derm: Denies rash, dry skin, scaling or peeling skin change Heme: Denies easy bruising, bleeding, bleeding gums Neuro: Denies headache, numbness, weakness, slurred speech, loss of memory or consciousness  Past Medical History:  He,  has a past medical history of Anemia, Bowel perforation (HCC), Chronic combined systolic (congestive) and diastolic (congestive) heart failure (HCC), Chronic diarrhea, Diverticulosis,  sigmoid (02/27/2018), NICM (nonischemic cardiomyopathy) ([place]), Nonobstructive CAD (coronary artery disease), and Stuttering during school years.   Surgical History:   Past Surgical History:  Procedure Laterality Date  . APPENDECTOMY    . COLONOSCOPY WITH PROPOFOL N/A 02/27/2018   Procedure: COLONOSCOPY WITH PROPOFOL;  Surgeon: Lin Landsman, MD;  Location: [facility] ENDOSCOPY;  Service: Gastroenterology;  Laterality: N/A;  . EYE SURGERY    . RIGHT/LEFT HEART CATH AND CORONARY ANGIOGRAPHY N/A 02/09/2019   Procedure: RIGHT/LEFT HEART CATH AND CORONARY ANGIOGRAPHY;  Surgeon: Wellington Hampshire, MD;  Location: [facility];  Service: Cardiovascular;  Laterality: N/A;     Social History:   reports that he has never smoked. He has never used smokeless tobacco. He reports that he does not drink alcohol and does not use drugs.   Family History:  His family history includes Cancer in his brother and father; Uterine cancer in his mother.   Allergies No Known Allergies   Home Medications  Prior to Admission medications  Medication Sig Start Date End Date Taking? Authorizing Provider  losartan (COZAAR) 25 MG tablet Take 12.5 mg by mouth daily. 07/17/20  Yes [provider]  metoprolol succinate (TOPROL XL) 25 MG 24 hr tablet Take 0.5 tablets (12.5 mg total) by mouth daily. 01/23/21  Yes Wellington Hampshire, MD  potassium chloride (KLOR-CON) 10 MEQ tablet Take 2 tablets (20 mEq total) by mouth daily as needed (when taking fluid pill). Patient taking differently: Take 20 mEq by mouth daily. 07/15/20  Yes Dunn, Areta Haber, PA-C  torsemide (DEMADEX) 20 MG tablet Take 1 tablet (20 mg total) each morning. Take additional dose (20 mg total) on Monday and Thursday afternoons Patient taking differently: Take 20 mg by mouth daily. 09/01/20  Yes Rise Mu, PA-C     Level 3 follow-up     C. Derrill Kay, MD [facility]   *This note was dictated using voice recognition software/Dragon.   Despite best efforts to proofread, errors can occur which can change the meaning.  Any change was purely unintentional.

## 2021-02-11 NOTE — Consult Note (Signed)
[place] for Electrolyte Monitoring and Replacement   Recent Labs: Potassium (mmol/L)  Date Value  02/11/2021 3.5   Magnesium (mg/dL)  Date Value  02/11/2021 2.0   Calcium (mg/dL)  Date Value  02/11/2021 7.8 (L)   Albumin (g/dL)  Date Value  06/29/2020 4.3   Phosphorus (mg/dL)  Date Value  02/11/2021 2.9   Sodium (mmol/L)  Date Value  02/11/2021 140  06/29/2020 143     Assessment: Pt is a 75 yo presented to the ED on 2/15 due to a syncopal event with loss of consciousness. Suspected sepsis with septic shock due to suspected UTI in the setting of chronic cystitis. His Scr is trending downward (4.92 >> 4.09 >> 4.03 >>3.58>>3.35).    Pharmacy has been consulted to monitor and replenish electrolytes.   MIVF: 0.9% NaCl w/ 20 mEq/L continuous at 75 mL/hr  Supplement: KLOR-CON 20 mEq PO daily  Total daily K+ supplement = ~56 mEq   Goal of Therapy:  Electrolytes WNL  Plan:  - Continue IV fluids and oral supplement - Will give an additional lunchtime supplement of 56mq per trend - Will follow K, Mg, and Phos with am labs  CLu Duffel PharmD, BCPS Clinical Pharmacist 02/11/2021 7:42 AM

## 2021-02-12 LAB — CBC
HCT: 23.9 % — ABNORMAL LOW (ref 39.0–52.0)
Hemoglobin: 7.5 g/dL — ABNORMAL LOW (ref 13.0–17.0)
MCH: 29.3 pg (ref 26.0–34.0)
MCHC: 31.4 g/dL (ref 30.0–36.0)
MCV: 93.4 fL (ref 80.0–100.0)
Platelets: 213 10*3/uL (ref 150–400)
RBC: 2.56 MIL/uL — ABNORMAL LOW (ref 4.22–5.81)
RDW: 14.7 % (ref 11.5–15.5)
WBC: 11.4 10*3/uL — ABNORMAL HIGH (ref 4.0–10.5)
nRBC: 0 % (ref 0.0–0.2)

## 2021-02-12 LAB — MAGNESIUM: Magnesium: 1.8 mg/dL (ref 1.7–2.4)

## 2021-02-12 LAB — PHOSPHORUS: Phosphorus: 2.9 mg/dL (ref 2.5–4.6)

## 2021-02-12 LAB — CULTURE, BLOOD (ROUTINE X 2)
Culture: NO GROWTH
Culture: NO GROWTH
Special Requests: ADEQUATE

## 2021-02-12 LAB — BASIC METABOLIC PANEL
Anion gap: 9 (ref 5–15)
BUN: 59 mg/dL — ABNORMAL HIGH (ref 8–23)
CO2: 19 mmol/L — ABNORMAL LOW (ref 22–32)
Calcium: 8 mg/dL — ABNORMAL LOW (ref 8.9–10.3)
Chloride: 113 mmol/L — ABNORMAL HIGH (ref 98–111)
Creatinine, Ser: 2.94 mg/dL — ABNORMAL HIGH (ref 0.61–1.24)
GFR, Estimated: 22 mL/min — ABNORMAL LOW (ref >60)
Glucose, Bld: 97 mg/dL (ref 70–99)
Potassium: 4.1 mmol/L (ref 3.5–5.1)
Sodium: 141 mmol/L (ref 135–145)

## 2021-02-12 LAB — FOLATE: Folate: 11.2 ng/mL (ref >5.9)

## 2021-02-12 LAB — VITAMIN B12: Vitamin B-12: 391 pg/mL (ref 180–914)

## 2021-02-12 MED ORDER — SODIUM CHLORIDE 0.9 % IV BOLUS
500.0000 mL | Freq: Once | INTRAVENOUS | Status: AC
  Administered 2021-02-12: 500 mL via INTRAVENOUS

## 2021-02-12 MED ORDER — LEVOTHYROXINE SODIUM 100 MCG/5ML IV SOLN
12.5000 ug | Freq: Every day | INTRAVENOUS | Status: DC
  Administered 2021-02-12 – 2021-02-18 (×7): 12.5 ug via INTRAVENOUS
  Filled 2021-02-12 (×8): qty 5

## 2021-02-12 MED ORDER — LACTATED RINGERS IV SOLN: INTRAVENOUS | Status: DC

## 2021-02-12 MED ORDER — LEVOTHYROXINE SODIUM 50 MCG PO TABS: 25.0000 ug | ORAL_TABLET | Freq: Every day | ORAL | Status: DC

## 2021-02-12 MED ORDER — PIPERACILLIN-TAZOBACTAM 3.375 G IVPB
3.3750 g | Freq: Three times a day (TID) | INTRAVENOUS | Status: AC
  Administered 2021-02-12 – 2021-02-15 (×11): 3.375 g via INTRAVENOUS
  Filled 2021-02-12 (×11): qty 50

## 2021-02-12 NOTE — Progress Notes (Signed)
PHARMACY NOTE:  ANTIMICROBIAL RENAL DOSAGE ADJUSTMENT  Current antimicrobial regimen includes a mismatch between antimicrobial dosage and estimated renal function.  As per policy approved by the Pharmacy & Therapeutics and Medical Executive Committees, the antimicrobial dosage will be adjusted accordingly.  Current antimicrobial dosage:  Zosyn 2.25g q8h  Indication: Sepsis  Renal Function: now > 76m/min per steady recovery  Estimated Creatinine Clearance: 21.8 mL/min (A) (by C-G formula based on SCr of 2.94 mg/dL (H)). []      On intermittent HD, scheduled: []      On CRRT    Antimicrobial dosage has been changed to:  Zosyn 3.375g q8 (4hr infusion)  Additional comments:   Thank you for allowing pharmacy to be a part of this patient's care.  CLu Duffel PharmD, BCPS Clinical Pharmacist 02/12/2021 8:55 AM

## 2021-02-12 NOTE — Progress Notes (Signed)
[facility]  ROUNDING NOTE   Subjective:   Hospital Course: admitted on 2/15 for near syncope  Having large amount of liquid stools Able to eat good. Ate 100% of breakfast Abdomen is still distended UOP is low but Creatinine is improving   Objective:  Vital signs in last 24 hours:  Temp:  [97.5 F (36.4 C)-98.3 F (36.8 C)] 97.7 F (36.5 C) (02/20 0400) Pulse Rate:  [39-104] 54 (02/20 0700) Resp:  [12-23] 16 (02/20 0700) BP: (89-139)/(36-89) 120/50 (02/20 0700) SpO2:  [89 %-100 %] 97 % (02/20 0700) Weight:  [79 kg] 79 kg (02/20 0611)  Weight change: 6 kg Filed Weights   02/12/2021 0912 02/11/21 0500 02/12/21 0611  Weight: 67 kg 73 kg 79 kg    Intake/Output: I/O last 3 completed shifts: In: 5778.5 [P.O.:960; I.V.:3521.9; IV Piggyback:1296.6] Out: 3600 [Stool:3600]   Intake/Output this shift:  No intake/output data recorded.  Physical Exam: General: Ill appearing  Head: Normocephalic, atraumatic. Moist oral mucosal membranes  Lungs:  Clear to auscultation  Heart: Regular rate and rhythm  Abdomen:  Soft, nontender, +distended, tympanic  Extremities:  no peripheral edema.  Neurologic: Nonfocal, moving all four extremities  Skin: No lesions  Access:   Rectal tube External using collection system  Basic Metabolic Panel: Recent Labs  Lab 02/08/21 0410 02/09/21 6761 02/10/21 0611 02/10/21 2040 02/11/21 0624 02/11/21 2259 02/12/21 0527  NA 140 141 141  --  140  --  141  K 4.4 3.5 2.9* 3.7 3.5 4.4 4.1  CL 115* 109 105  --  107  --  113*  CO2 17* 21* 26  --  22  --  19*  GLUCOSE 116* 124* 120*  --  98  --  97  BUN 102* 95* 84*  --  72*  --  59*  CREATININE 4.09* 4.03* 3.58*  --  3.35*  --  2.94*  CALCIUM 8.1* 7.9* 7.6*  --  7.8*  --  8.0*  MG 1.8 2.3 2.1  --  2.0  --  1.8  PHOS 3.9 3.1 2.7  --  2.9  --  2.9    Liver Function Tests: Recent Labs  Lab 02/11/21 0624  AST 13*  ALT 8  ALKPHOS 43  BILITOT 0.5  PROT 5.5*  ALBUMIN 2.1*   No  results for input(s): LIPASE, AMYLASE in the last 168 hours. No results for input(s): AMMONIA in the last 168 hours.  CBC: Recent Labs  Lab 02/08/21 0520 02/09/21 9509 02/10/21 0611 02/11/21 0624 02/12/21 0527  WBC 9.1 7.6 8.9 10.0 11.4*  HGB 7.6* 7.8* 7.4* 7.9* 7.5*  HCT 23.8* 23.8* 22.6* 24.5* 23.9*  MCV 92.2 89.8 90.8 92.1 93.4  PLT 213 195 201 210 213    Cardiac Enzymes: No results for input(s): CKTOTAL, CKMB, CKMBINDEX, TROPONINI in the last 168 hours.  BNP: Invalid input(s): POCBNP  CBG: Recent Labs  Lab 02/08/21 0628  GLUCAP 102*    Microbiology: Results for orders placed or performed during the hospital encounter of 02/20/2021  Culture, blood (routine x 2)     Status: None   Collection Time: 01/29/2021 10:29 AM   Specimen: BLOOD  Result Value Ref Range Status   Specimen Description BLOOD LEFT ANTECUBITAL  Final   Special Requests   Final    BOTTLES DRAWN AEROBIC AND ANAEROBIC Blood Culture results may not be optimal due to an excessive volume of blood received in culture bottles   Culture   Final  NO GROWTH 5 DAYS Performed at [facility] [address] [place], [place] [address]    Report Status 02/12/2021 FINAL  Final  Culture, blood (routine x 2)     Status: None   Collection Time: 02/08/2021 10:30 AM   Specimen: BLOOD  Result Value Ref Range Status   Specimen Description BLOOD BLOOD LEFT FOREARM  Final   Special Requests   Final    BOTTLES DRAWN AEROBIC ONLY Blood Culture adequate volume   Culture   Final    NO GROWTH 5 DAYS Performed at [facility], [address] [place], [place] [address]    Report Status 02/12/2021 FINAL  Final  SARS CORONAVIRUS 2 (TAT 6-24 HRS) Nasopharyngeal Nasopharyngeal Swab     Status: None   Collection Time: 02/01/2021 12:09 PM   Specimen: Nasopharyngeal Swab  Result Value Ref Range Status   SARS Coronavirus 2 NEGATIVE NEGATIVE Final    Comment: (NOTE) SARS-CoV-2 target nucleic acids are NOT  DETECTED.  The SARS-CoV-2 RNA is generally detectable in upper and lower respiratory specimens during the acute phase of infection. Negative results do not preclude SARS-CoV-2 infection, do not rule out co-infections with other pathogens, and should not be used as the sole basis for treatment or other patient management decisions. Negative results must be combined with clinical observations, patient history, and epidemiological information. The expected result is Negative.  Fact Sheet for Patients: SugarRoll.be  Fact Sheet for Healthcare Providers: https://www.woods-mathews.com/  This test is not yet approved or cleared by the [place] FDA and  has been authorized for detection and/or diagnosis of SARS-CoV-2 by FDA under an Emergency Use Authorization (EUA). This EUA will remain  in effect (meaning this test can be used) for the duration of the COVID-19 declaration under Se ction 564(b)(1) of the Act, 21 U.S.C. section 360bbb-3(b)(1), unless the authorization is terminated or revoked sooner.  Performed at [facility], [place] [address], [place], [place] [address]   Urine Culture     Status: Abnormal   Collection Time: 02/14/2021  4:16 PM   Specimen: Urine, Random  Result Value Ref Range Status   Specimen Description   Final    URINE, RANDOM Performed at [facility], [address] [place], [place] [address]    Special Requests   Final    NONE Performed at [facility], [address] [place], [place] [address]    Culture (A)  Final    >=100,000 COLONIES/mL MULTIPLE SPECIES PRESENT, SUGGEST RECOLLECTION   Report Status 02/09/2021 FINAL  Final  MRSA PCR Screening     Status: None   Collection Time: 02/08/21  6:30 AM   Specimen: Nasopharyngeal  Result Value Ref Range Status   MRSA by PCR NEGATIVE NEGATIVE Final    Comment:        The GeneXpert MRSA Assay (FDA approved for NASAL specimens only), is  one component of a comprehensive MRSA colonization surveillance program. It is not intended to diagnose MRSA infection nor to guide or monitor treatment for MRSA infections. Performed at [facility], [address] [place], [place] [address]   Gastrointestinal Panel by PCR , Stool     Status: None   Collection Time: 02/10/21 12:36 PM   Specimen: Stool  Result Value Ref Range Status   Campylobacter species NOT DETECTED NOT DETECTED Final   Plesimonas shigelloides NOT DETECTED NOT DETECTED Final   Salmonella species NOT DETECTED NOT DETECTED Final   Yersinia enterocolitica NOT  DETECTED NOT DETECTED Final   Vibrio species NOT DETECTED NOT DETECTED Final   Vibrio cholerae NOT DETECTED NOT DETECTED Final   Enteroaggregative E coli (EAEC) NOT DETECTED NOT DETECTED Final   Enteropathogenic E coli (EPEC) NOT DETECTED NOT DETECTED Final   Enterotoxigenic E coli (ETEC) NOT DETECTED NOT DETECTED Final   Shiga like toxin producing E coli (STEC) NOT DETECTED NOT DETECTED Final   Shigella/Enteroinvasive E coli (EIEC) NOT DETECTED NOT DETECTED Final   Cryptosporidium NOT DETECTED NOT DETECTED Final   Cyclospora cayetanensis NOT DETECTED NOT DETECTED Final   Entamoeba histolytica NOT DETECTED NOT DETECTED Final   Giardia lamblia NOT DETECTED NOT DETECTED Final   Adenovirus F40/41 NOT DETECTED NOT DETECTED Final   Astrovirus NOT DETECTED NOT DETECTED Final   Norovirus GI/GII NOT DETECTED NOT DETECTED Final   Rotavirus A NOT DETECTED NOT DETECTED Final   Sapovirus (I, II, IV, and V) NOT DETECTED NOT DETECTED Final    Comment: Performed at [facility], [address] [place], [place] [address]  C Difficile Quick Screen (NO PCR Reflex)     Status: None   Collection Time: 02/10/21 12:36 PM   Specimen: STOOL  Result Value Ref Range Status   C Diff antigen NEGATIVE NEGATIVE Final   C Diff toxin NEGATIVE NEGATIVE Final   C Diff interpretation No C. difficile detected.  Final     Comment: Performed at [facility], [address] [place], [place] [address]    Coagulation Studies: No results for input(s): LABPROT, INR in the last 72 hours.  Urinalysis: No results for input(s): COLORURINE, LABSPEC, PHURINE, GLUCOSEU, HGBUR, BILIRUBINUR, KETONESUR, PROTEINUR, UROBILINOGEN, NITRITE, LEUKOCYTESUR in the last 72 hours.  Invalid input(s): APPERANCEUR    Imaging: No results found.   Medications:   . sodium chloride 250 mL (01/30/2021 1618)  . 0.9 % NaCl with KCl 20 mEq / L 75 mL/hr at 02/12/21 0700  . albumin human Stopped (02/11/21 2259)  . norepinephrine (LEVOPHED) Adult infusion 5 mcg/min (02/12/21 0700)  . piperacillin-tazobactam (ZOSYN)  IV 100 mL/hr at 02/12/21 0700   . Chlorhexidine Gluconate Cloth  6 each Topical Daily  . heparin  5,000 Units Subcutaneous Q8H  . levothyroxine  12.5 mcg Intravenous Daily  . midodrine  10 mg Oral TID WC  . potassium chloride  20 mEq Oral Daily   acetaminophen, oxyCODONE-acetaminophen  Assessment/ Plan:  [name] is a 75 y.o. white male with hypertension, coronary artery disease, congestive heart failure, history of bowel perforation,  who was admitted to [facility] on 02/02/2021 for Cystitis [N30.90]  1. Acute kidney injury with chronic kidney disease stage IV with proteinuria and hematuria:  baseline creatinine of 2.57, GFR of 24 on 09/05/2020.  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  CT abdomen 2/15- chronic cystitis with moderate bilateral hydronephrosis with bladder calculi -chronic. Appreciate urology input.   02/19 0701 - 02/20 0700 In: 4800.9 [P.O.:960; I.V.:2590.9; IV Piggyback:1250] Out: 2800 [Stool:2800]   Lab Results  Component Value Date   CREATININE 2.94 (H) 02/12/2021   CREATININE 3.35 (H) 02/11/2021   CREATININE 3.58 (H) 02/10/2021   S Creatinine trend appears to be improving Electrolytes and Volume status are acceptable No acute indication for Dialysis at  present  Continue supportive care and maintain fluid replacement  2. Hypotension: requiring vasopressors: norepinephrine.  Urine culture with mutliple species present on 2/15.  - empiric antibiotics: pip/tazo   3. Proteinuria UPC 3.68 K/l ratio 1.68 Check ANA  4. Chronic diarrhea May need GI eval C Diff neg     LOS: 5 Maggy Wyble 2/20/20228:36 AM

## 2021-02-12 NOTE — Progress Notes (Signed)
NAME:  William Holland, MRN:  [id number], DOB:  [birth year], LOS: 5 ADMISSION DATE:  01/28/2021, INITIAL CONSULTATION DATE:  02/09/2021 REFERRING MD:  Dr. Cherylann Holland, CHIEF COMPLAINT: Abdominal Pain & Syncope  Brief History:  75 yo male presenting to the ED in septic shock due to UTI requiring low dose pressors, admitted to ICU.  History of Present Illness:  75 yo male presented to the ED after a syncopal event with loss of consciousness. The patient's brother reported the patient was sitting, getting his hair cut when he fell over and became unresponsive for about 15 minutes. The patient denied experiencing any symptoms prior to falling over, no dizziness/lightheadness/prodromal symptoms. Only other complaint is mild pain to light palpation of abdomen which is chronically distended. Patient denies fever/chills/cough, does admit to diarrhea but states this is chronic. Patient denies that anything like this has ever happened to him before.   ED course: Initial vitals: hypothermic- 95.4/ NSR- 96/ RR- 13/ hypotensive- 71/52 (60). Labs significant for: leukocytosis-WBC 25.1, hyperkalemic 5.6, Cl 115, serum CO2 14, BUN/Cr 110/4.92, BNP 128.4, troponin 18~20, lactic 1.1, PCT 1.87  Past Medical History:  CAD NICM Diverticulosis Chronic combined systolic & diastolic HF  Significant Hospital Events:  01/30/2021 Admission with suspected UTI, low dose levophed 2/16: Renal US with moderate bilateral hydronephrosis, multiple large bladder calculi 2/17: Urology consulted;recommedns antibiotics and follow up cultures; recommends against aggressive interventions including ureteral stents or nephrostomy tubes 2/18: Weaning Levophed, currently at 3 mcg  Consults:  Nephrology Urology  Procedures:  N/A  Significant Diagnostic Tests:  01/26/2021 CT Abdomen pelvis >>  Findings of chronic cystitis with interval development of moderate bilateral hydronephrosis. There are a few small foci of air within the left renal  pelvis. Findings raise suspicion for ascending urinary tract infection. Correlate with urinalysis. Persistent severe diffuse colonic dilatation with transition point again noted within the mid sigmoid colon. Circumferentially thickened 6 cm segment of mid sigmoid colon containing numerous diverticula. Similar degree of mild adjacent fat stranding. Findings may represent stricture related to chronic diverticulitis. Underlying colonic neoplasm not excluded. Loss of fat plane between the bladder dome and thickened sigmoid colon raises the possibility of a colovesical fistula. 02/01/2021: CT Head>>1. No acute intracranial abnormality. 2/16: Renal US>>Moderate bilateral hydronephrosis is noted. Continued presence of multiple large bladder calculi.  Micro Data:  02/06/2021 COVID 19 >>negative 02/05/2021 BC x 2 >>no growth to date 2/15: Urine>>  >=100,000 COLONIES/mL MULTIPLE SPECIES PRESENT, SUGGEST RECOLLECTIONAbnormal  2/16: MRSA PCR>>negative 2/18: GI panel>> 2/18: C.diff>>  Antimicrobials:  Cefepime 2/15 >>2/17 Flagyl 2/15 >>2/17 Vancomycin x 1 dose 2/15 Zosyn 2/17>>  Interval History:  -No events reported overnight -Pt is awake and alert -Hemodynamically stable, afebrile -Weaning down Levophed (currenlty at 3 mcg); starting Midodrine -Bicarb gtt being discontinued due to metabolic alkalosis -Pt noted to have large volume of diarrhea in rectal tube;  GI panel and C. Diff negative  Objective   Blood pressure (!) 117/40, pulse (!) 47, temperature (!) 97.4 F (36.3 C), temperature source Oral, resp. rate 10, height 5' 6"  (1.676 m), weight 79 kg, SpO2 97 %.        Intake/Output Summary (Last 24 hours) at 02/12/2021 2252 Last data filed at 02/12/2021 2000 Gross per 24 hour  Intake 3387.79 ml  Output 2800 ml  Net 587.79 ml   Filed Weights   01/28/2021 0912 02/11/21 0500 02/12/21 0611  Weight: 67 kg 73 kg 79 kg    Examination: General: Acute on chronically ill appearing male,  sitting on  bed, on room air, in NAD HEENT: Atraumatic, normocephalic, neck supple, no JVD Neuro: Alert, oriented to person, place, and situation.  Follows commands, no focal deficits, speech clear, pupils PERRL CV: Regular rate and rhythm, s1s2, no M/R/G, 2+ distal pulses Pulm: Clear to auscultation bilaterally, even, nonlabored, normal effort GI: Distended, no tenderness, no guarding or rebound tenderness, BS+ x4 Skin: Warm and dry.  No obvious rashes, lesions, or ulcerations Extremities: No deformities, trace edema bilateral LE  Resolved Hospital Problem list   N/A  Assessment & Plan:   Septic Shock in the setting of UTI HFrEF- LVEF 30-35% Hx: NICM, CAD -Continuous cardiac monitoring -Maintain MAP greater than 65 -Gentle IV fluids -Levophed as needed to maintain MAP goal -Start midodrine - home medications on hold in the setting of hypotension & AKI on CKD: metoprolol, losartan, torsemide. Consider restarting as patient stabilizes - Check thyroid function, cortisol   Severe Sepsis Due to suspected UTI in the setting of chronic cystitis Lactic: 1.1, Baseline PCT: 1.87, UA: pending, CXR: chronic without acute disease, CT Abdomen: concern for UTI Initial interventions/workup included: 2.25 L of LR & Cefepime/ Vancomycin/ Metronidazole -Monitor fever curve -Trend WBC and procalcitonin -Follow cultures as above -Continue IV Zosyn pending cultures and sensitiviites   Acute Kidney Injury on CKD Stage IV (In the setting of shock. Baseline creatinine 2.36-2.57, creatinine on admission 4.92) Bilateral Hydronephrosis Hypokalemia Metabolic Acidosis>>resolved Metabolic Alkalosis -Monitor I&O's / urinary output -Follow BMP -Ensure adequate renal perfusion -Avoid nephrotoxic agents as able -Replace electrolytes as indicated -Nephrology following, appreciate input -Bicarb gtt d/c by Nephrology due to metabolic alkalosis -Starting NS w/ KCL -Urology consulted, did not recommend any aggressive  intervention with ureteral stents or nephrostomy tubes   Anemia -Monitor for S/Sx of bleeding -Trend CBC -Heparin SQ for VTE Prophylaxis  -Transfuse for Hgb <7      Best practice (evaluated daily)  Diet: Heart healthy Pain/Anxiety/Delirium protocol (if indicated): N/a VAP protocol (if indicated): n/a DVT prophylaxis: heparin SQ GI prophylaxis: N/a Glucose control: monitor as needed Mobility: bedrest, mobilize as tolerated Disposition:ICU  Goals of Care:  Last date of multidisciplinary goals of care discussion:02/12/21 Family and staff present: Pt and bedside RN; brother at bedside Summary of discussion: plan of care including antibiotics & vasopressors, brother wants full code Follow up goals of care discussion due: 02/13/21 Code Status: FULL  Labs   CBC: Recent Labs  Lab 02/08/21 0520 02/09/21 0638 02/10/21 0611 02/11/21 0624 02/12/21 0527  WBC 9.1 7.6 8.9 10.0 11.4*  HGB 7.6* 7.8* 7.4* 7.9* 7.5*  HCT 23.8* 23.8* 22.6* 24.5* 23.9*  MCV 92.2 89.8 90.8 92.1 93.4  PLT 213 195 201 210 628    Basic Metabolic Panel: Recent Labs  Lab 02/08/21 0410 02/09/21 0638 02/10/21 0611 02/10/21 2040 02/11/21 0624 02/11/21 2259 02/12/21 0527  NA 140 141 141  --  140  --  141  K 4.4 3.5 2.9* 3.7 3.5 4.4 4.1  CL 115* 109 105  --  107  --  113*  CO2 17* 21* 26  --  22  --  19*  GLUCOSE 116* 124* 120*  --  98  --  97  BUN 102* 95* 84*  --  72*  --  59*  CREATININE 4.09* 4.03* 3.58*  --  3.35*  --  2.94*  CALCIUM 8.1* 7.9* 7.6*  --  7.8*  --  8.0*  MG 1.8 2.3 2.1  --  2.0  --  1.8  PHOS  3.9 3.1 2.7  --  2.9  --  2.9   GFR: Estimated Creatinine Clearance: 21.8 mL/min (A) (by C-G formula based on SCr of 2.94 mg/dL (H)). Recent Labs  Lab 02/14/2021 1029 02/14/2021 1030 02/02/2021 1703 02/08/21 0520 02/09/21 1610 02/10/21 0611 02/11/21 0624 02/12/21 0527  PROCALCITON 1.87  --   --   --   --   --   --   --   WBC  --   --   --    < > 7.6 8.9 10.0 11.4*  LATICACIDVEN  --   1.1 0.7  --   --   --   --   --    < > = values in this interval not displayed.    Liver Function Tests: Recent Labs  Lab 02/11/21 0624  AST 13*  ALT 8  ALKPHOS 43  BILITOT 0.5  PROT 5.5*  ALBUMIN 2.1*   No results for input(s): LIPASE, AMYLASE in the last 168 hours. No results for input(s): AMMONIA in the last 168 hours.  ABG No results found for: PHART, PCO2ART, PO2ART, HCO3, TCO2, ACIDBASEDEF, O2SAT   Coagulation Profile: No results for input(s): INR, PROTIME in the last 168 hours.  Cardiac Enzymes: No results for input(s): CKTOTAL, CKMB, CKMBINDEX, TROPONINI in the last 168 hours.  HbA1C: Hgb A1c MFr Bld  Date/Time Value Ref Range Status  01/05/2019 04:37 PM 5.2 4.8 - 5.6 % Final    Comment:    (NOTE) Pre diabetes:          5.7%-6.4% Diabetes:              >6.4% Glycemic control for   <7.0% adults with diabetes     CBG: Recent Labs  Lab 02/08/21 0628  GLUCAP 102*    Review of Systems: POSITIVES in BOLD  Gen: Denies fever, chills, weight change, fatigue, night sweats HEENT: Denies blurred vision, double vision, hearing loss, tinnitus, sinus congestion, rhinorrhea, sore throat, neck stiffness, dysphagia PULM: Denies shortness of breath, cough, sputum production, hemoptysis, wheezing CV: Denies chest pain, edema, orthopnea, paroxysmal nocturnal dyspnea, palpitations GI: Denies abdominal pain, nausea, vomiting, diarrhea, hematochezia, melena, constipation, change in bowel habits GU: Denies dysuria, hematuria, polyuria, oliguria, urethral discharge Endocrine: Denies hot or cold intolerance, polyuria, polyphagia or appetite change Derm: Denies rash, dry skin, scaling or peeling skin change Heme: Denies easy bruising, bleeding, bleeding gums Neuro: Denies headache, numbness, weakness, slurred speech, loss of memory or consciousness  Past Medical History:  He,  has a past medical history of Anemia, Bowel perforation (HCC), Chronic combined systolic (congestive)  and diastolic (congestive) heart failure (HCC), Chronic diarrhea, Diverticulosis, sigmoid (02/27/2018), NICM (nonischemic cardiomyopathy) ([place]), Nonobstructive CAD (coronary artery disease), and Stuttering during school years.   Surgical History:   Past Surgical History:  Procedure Laterality Date  . APPENDECTOMY    . COLONOSCOPY WITH PROPOFOL N/A 02/27/2018   Procedure: COLONOSCOPY WITH PROPOFOL;  Surgeon: Lin Landsman, MD;  Location[facility] ENDOSCOPY;  Service: Gastroenterology;  Laterality: N/A;  . EYE SURGERY    . RIGHT/LEFT HEART CATH AND CORONARY ANGIOGRAPHY N/A 02/09/2019   Procedure: RIGHT/LEFT HEART CATH AND CORONARY ANGIOGRAPHY;  Surgeon: Wellington Hampshire, MD;  Location: [facility];  Service: Cardiovascular;  Laterality: N/A;     Social History:   reports that he has never smoked. He has never used smokeless tobacco. He reports that he does not drink alcohol and does not use drugs.   Family History:  His family history  includes Cancer in his brother and father; Uterine cancer in his mother.   Allergies No Known Allergies   Home Medications  Prior to Admission medications   Medication Sig Start Date End Date Taking? Authorizing Provider  losartan (COZAAR) 25 MG tablet Take 12.5 mg by mouth daily. 07/17/20  Yes [provider]  metoprolol succinate (TOPROL XL) 25 MG 24 hr tablet Take 0.5 tablets (12.5 mg total) by mouth daily. 01/23/21  Yes Wellington Hampshire, MD  potassium chloride (KLOR-CON) 10 MEQ tablet Take 2 tablets (20 mEq total) by mouth daily as needed (when taking fluid pill). Patient taking differently: Take 20 mEq by mouth daily. 07/15/20  Yes Dunn, Areta Haber, PA-C  torsemide (DEMADEX) 20 MG tablet Take 1 tablet (20 mg total) each morning. Take additional dose (20 mg total) on Monday and Thursday afternoons Patient taking differently: Take 20 mg by mouth daily. 09/01/20  Yes Rise Mu, PA-C     Level 3 follow-up     C. Derrill Kay,  MD [facility]   *This note was dictated using voice recognition software/Dragon.  Despite best efforts to proofread, errors can occur which can change the meaning.  Any change was purely unintentional.

## 2021-02-12 NOTE — Progress Notes (Addendum)
PHARMACIST - PHYSICIAN COMMUNICATION  DR:  Patsey Berthold  CONCERNING: IV to Oral Route Change Policy  RECOMMENDATION: This patient is receiving levothyroxine by the intravenous route.  Based on criteria approved by the Pharmacy and Therapeutics Committee, the intravenous medication(s) is/are being converted to the equivalent oral dose form(s).   DESCRIPTION: These criteria include:  The patient is eating (either orally or via tube) and/or has been taking other orally administered medications for a least 24 hours  The patient has no evidence of active gastrointestinal bleeding or impaired GI absorption (gastrectomy, short bowel, patient on TNA or NPO).  Addendum: Per discussion with nursing there IS concern for impaired absorption - switched back to IV  If you have questions about this conversion, please contact the Pharmacy Department  []   [phone] )  William Holland [x]   [phone] )  William Holland, Holland. []   [phone] )  William Holland []   [phone] )  William Holland []   [phone] )  [place], PharmD, BCPS Clinical Pharmacist 02/12/2021 9:06 AM

## 2021-02-12 NOTE — Consult Note (Signed)
[place] for Electrolyte Monitoring and Replacement   Recent Labs: Potassium (mmol/L)  Date Value  02/12/2021 4.1   Magnesium (mg/dL)  Date Value  02/12/2021 1.8   Calcium (mg/dL)  Date Value  02/12/2021 8.0 (L)   Albumin (g/dL)  Date Value  02/11/2021 2.1 (L)  06/29/2020 4.3   Phosphorus (mg/dL)  Date Value  02/12/2021 2.9   Sodium (mmol/L)  Date Value  02/12/2021 141  06/29/2020 143     Assessment: Pt is a 75 yo presented to the ED on 2/15 due to a syncopal event with loss of consciousness. Suspected sepsis with septic shock due to suspected UTI in the setting of chronic cystitis. His Scr is trending downward (4.92 >> 4.09 >> 4.03 >>3.58>>3.35>2.94).    Pharmacy has been consulted to monitor and replenish electrolytes.   MIVF: 0.9% NaCl w/ 20 mEq/L continuous at 75 mL/hr  Supplement: KLOR-CON 20 mEq PO daily  Total daily K+ supplement = ~56 mEq   Goal of Therapy:  Electrolytes WNL  Plan:  - Continue IV fluids and oral KCl supplement - no additional replenishment warranted at this time - Will follow K, Mg, and Phos with am labs  Lu Duffel, PharmD, BCPS Clinical Pharmacist 02/12/2021 8:50 AM

## 2021-02-13 ENCOUNTER — Encounter: Payer: Self-pay | Admitting: Internal Medicine

## 2021-02-13 ENCOUNTER — Inpatient Hospital Stay: Payer: Medicare Other

## 2021-02-13 DIAGNOSIS — Z515 Encounter for palliative care: Secondary | ICD-10-CM | POA: Diagnosis not present

## 2021-02-13 DIAGNOSIS — Z7189 Other specified counseling: Secondary | ICD-10-CM

## 2021-02-13 DIAGNOSIS — N170 Acute kidney failure with tubular necrosis: Secondary | ICD-10-CM | POA: Diagnosis not present

## 2021-02-13 DIAGNOSIS — A419 Sepsis, unspecified organism: Secondary | ICD-10-CM | POA: Diagnosis not present

## 2021-02-13 DIAGNOSIS — N3090 Cystitis, unspecified without hematuria: Secondary | ICD-10-CM | POA: Diagnosis not present

## 2021-02-13 DIAGNOSIS — R6521 Severe sepsis with septic shock: Secondary | ICD-10-CM | POA: Diagnosis not present

## 2021-02-13 LAB — PHOSPHORUS: Phosphorus: 3 mg/dL (ref 2.5–4.6)

## 2021-02-13 LAB — CBC
HCT: 23.8 % — ABNORMAL LOW (ref 39.0–52.0)
Hemoglobin: 7.3 g/dL — ABNORMAL LOW (ref 13.0–17.0)
MCH: 29.6 pg (ref 26.0–34.0)
MCHC: 30.7 g/dL (ref 30.0–36.0)
MCV: 96.4 fL (ref 80.0–100.0)
Platelets: 211 10*3/uL (ref 150–400)
RBC: 2.47 MIL/uL — ABNORMAL LOW (ref 4.22–5.81)
RDW: 14.7 % (ref 11.5–15.5)
WBC: 11 10*3/uL — ABNORMAL HIGH (ref 4.0–10.5)
nRBC: 0 % (ref 0.0–0.2)

## 2021-02-13 LAB — MAGNESIUM: Magnesium: 1.8 mg/dL (ref 1.7–2.4)

## 2021-02-13 LAB — BASIC METABOLIC PANEL
Anion gap: 7 (ref 5–15)
BUN: 53 mg/dL — ABNORMAL HIGH (ref 8–23)
CO2: 19 mmol/L — ABNORMAL LOW (ref 22–32)
Calcium: 7.9 mg/dL — ABNORMAL LOW (ref 8.9–10.3)
Chloride: 114 mmol/L — ABNORMAL HIGH (ref 98–111)
Creatinine, Ser: 3.09 mg/dL — ABNORMAL HIGH (ref 0.61–1.24)
GFR, Estimated: 20 mL/min — ABNORMAL LOW (ref >60)
Glucose, Bld: 99 mg/dL (ref 70–99)
Potassium: 4.5 mmol/L (ref 3.5–5.1)
Sodium: 140 mmol/L (ref 135–145)

## 2021-02-13 MED ORDER — SODIUM CHLORIDE 0.9 % IV BOLUS: 500.0000 mL | Freq: Once | INTRAVENOUS | Status: DC

## 2021-02-13 NOTE — Progress Notes (Signed)
0400 500 ml NS bolus given at 546m/hr per order, scanner broken in room, could not scan pump

## 2021-02-13 NOTE — Consult Note (Signed)
Consultation  Referring Provider:     Dr Merrilee Jansky Admit date  02/06/2021 Consult date        02/13/21 Reason for Consultation:     diarrhea         HPI:   William Holland is a 75 y.o. male admitted for urosepsis/unresponsiveness last week with history of CHF/cardiomyopathy/bowel perforation, CKD stage IV. He is being followed also by nephrology and urology for bladderstones/hydronephrosis who have recommended against aggressive therapy. Noted it was recommended to consult palliative care on admission due to his multiple significant comorbidities and grave status- this was done today and is in progress. He is also to be seen by cardiology. GI has been consulted for diarrhea- he had a negative stool pcr and cdiff test 02/10/21.  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 He was referret to surgeon to discuss elevate sigmoid resection. 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 Patient is a limited historian and although his brother who is POA was present when I arrived to room,  he had to leave immediately due to another family emergency. History is gathered through chart review, talking to staff and patient.  Patient unable to provide many details but states he has had diarrhea, nv and bloating off and on. Denies any current abdominal pain, NV, melena/hematochezia. He has been taking thin liquids since the weekend, remains on norepinephrine/midodrine, zosyn- he has also received cefepime, metronidazole,  and vancomycin this visit. He is aftebrile and hemodynamically stable at present. He does have some increase in anemia since hospitalization however has had no melena or hematemesis; red cells are normocytic and he has CKD/been on multiple abx & been septic.  PREVIOUS ENDOSCOPIES:            Colonoscopy 02/27/18- Dr Marius Ditch- fair prep; normal TI, some stool that needed lavaging, lesion at the appendiceal orifice; stricture in sigmoid colon, normal mucosa in the rectum/descending colon/transverse & ascending colon, and cecum. Severe diverticulosis and some peridiverticular erythema. Rectal polyp.  TI biopsy negative; appendiceal orifice lesion was lymphoid aggregate, random biopsy negative. Sigmoid biopsy with focal moderate active colitis and prominent lymphoid aggregates. Rectal polyp favored to be hyperplastic. There was no microscopic colitis, dysplasia, or malignancy. The active colitis in the sigmoid colon was favored to be compatible with diverticular disease. 2w of cipro and flagyl recommended. 3-5y repeat recommended  Prior abdominal imaging:  CT a/p 01/31/2021: IMPRESSION: 1. Findings of chronic cystitis with interval development of moderate bilateral hydronephrosis. There are a few small foci of air within the left renal pelvis. Findings raise suspicion for ascending urinary tract infection. Correlate with urinalysis. 2. Persistent severe diffuse  colonic dilatation with transition point again noted within the mid sigmoid colon. Circumferentially thickened 6 cm segment of  mid sigmoid colon containing numerous diverticula. Similar degree of mild adjacent fat stranding. Findings may represent stricture related to chronic diverticulitis. Underlying colonic neoplasm not excluded. 3. Loss of fat plane between the bladder dome and thickened sigmoid colon raises the possibility of a colovesical fistula. 01/09/19 CT A/p- IMPRESSION: 1. Small amount of free intraperitoneal air is again noted. This has not increased. 2. Partial colonic obstruction at the level of the mid sigmoid colon. The decompressed portion of the sigmoid colon demonstrates multiple diverticula and mild adjacent inflammation. Suspect diverticulitis with inflammation causing the partial obstruction. Neoplastic disease is not excluded. 3. No evidence of an abscess. 4. There is significant colonic distention with mild dilation of small bowel. Maximum colonic distention is the cecum at 10 cm. The degree of distention of bowel is similar to the prior exam. 5. New left and increased right pleural effusions with associated dependent atelectasis. 6. Diffuse soft tissue edema similar to the recent prior CT. Findings consistent with anasarca. 7. 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 01/05/19 A/P- IMPRESSION: 1. Free air in the abdomen consistent with bowel perforation. The site of perforation appears to be in the mid sigmoid region at the transition point between gaseous distention and bowel wall thickening of the colon. The site of transition could represent inflammation or tumor in the colon. 2. Air in the bladder and left renal collecting system with numerous stones in the bladder. 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 The patient had stones and air in the bladder on the prior study of 2017. 3. New small right pleural effusion. 4. New extensive anasarca. KUB 07/18/18- significant colonic distension  CT 03/10/16 A/P -IMPRESSION: 1. Apparent mild more  focal wall thickening along the proximal to mid sigmoid colon, with vague adjacent nodular density in the overlying fat, measuring approximately 3.2 cm. An underlying mass cannot be excluded. Colonoscopy would be helpful for further evaluation. 2. Suggestion of wall thickening along the descending and proximal sigmoid colon, which may reflect an infectious or inflammatory colitis. Scattered diverticulosis along the descending and sigmoid colon. 3. Small bilateral renal cysts noted. 4. Mildly enlarged prostate. 5. Small amount of air within the bladder may reflect prior Foley catheter placement. Several small stones noted within the bladder.  Past Medical History:  Diagnosis Date  . Anemia   . Bowel perforation ([place])   . Chronic combined systolic (congestive) and diastolic (congestive) heart failure (HCC)    a. 12/2018 Echo: EF of 30 to 35%, diffuse hypokinesis, grade 2 diastolic dysfunction, mild mitral regurgitation, moderately dilated left atrium, moderately reduced RV systolic function, trivial pericardial effusion was identified, along with a right pleural effusion  . Chronic diarrhea   . Diverticulosis, sigmoid 02/27/2018   Colonoscopy done on 02/27/2018 by Dr. Marius Ditch  . NICM (nonischemic cardiomyopathy) ([place])    a. 12/2018 Echo: Ef 30-35%, diff HK, Gr2 DD.  Marland Kitchen Nonobstructive CAD (coronary artery disease)    a. 01/2019 Cath: LM nl, LAD nl, LCX nl, RCA min irregs.  . Stuttering during school years     Past Surgical History:  Procedure Laterality Date  . APPENDECTOMY    . COLONOSCOPY WITH PROPOFOL N/A 02/27/2018   Procedure: COLONOSCOPY WITH PROPOFOL;  Surgeon: Lin Landsman, MD;  Location: [facility] ENDOSCOPY;  Service: Gastroenterology;  Laterality: N/A;  . EYE SURGERY    . RIGHT/LEFT HEART CATH AND CORONARY ANGIOGRAPHY  N/A 02/09/2019   Procedure: RIGHT/LEFT HEART CATH AND CORONARY ANGIOGRAPHY;  Surgeon: Wellington Hampshire, MD;  Location: [facility];  Service:  Cardiovascular;  Laterality: N/A;    Family History  Problem Relation Age of Onset  . Cancer Brother   . Cancer Father   . Uterine cancer Mother      Social History   Tobacco Use  . Smoking status: Never Smoker  . Smokeless tobacco: Never Used  Vaping Use  . Vaping Use: Never used  Substance Use Topics  . Alcohol use: No    Alcohol/week: 0.0 standard drinks  . Drug use: No    Prior to Admission medications   Medication Sig Start Date End Date Taking? Authorizing Provider  losartan (COZAAR) 25 MG tablet Take 12.5 mg by mouth daily. 07/17/20  Yes [provider]  metoprolol succinate (TOPROL XL) 25 MG 24 hr tablet Take 0.5 tablets (12.5 mg total) by mouth daily. 01/23/21  Yes Wellington Hampshire, MD  potassium chloride (KLOR-CON) 10 MEQ tablet Take 2 tablets (20 mEq total) by mouth daily as needed (when taking fluid pill). Patient taking differently: Take 20 mEq by mouth daily. 07/15/20  Yes Dunn, Areta Haber, PA-C  torsemide (DEMADEX) 20 MG tablet Take 1 tablet (20 mg total) each morning. Take additional dose (20 mg total) on Monday and Thursday afternoons Patient taking differently: Take 20 mg by mouth daily. 09/01/20  Yes Rise Mu, PA-C    Current Facility-Administered Medications  Medication Dose Route Frequency Provider Last Rate Last Admin  . 0.9 %  sodium chloride infusion  250 mL Intravenous Continuous Rust-Chester, Britton L, NP 20 mL/hr at 01/27/2021 1618 250 mL at 01/29/2021 1618  . acetaminophen (TYLENOL) tablet 650 mg  650 mg Oral Q6H PRN Awilda Bill, NP   650 mg at 02/12/21 1823  . albumin human 25 % solution 12.5 g  12.5 g Intravenous BID Tyler Pita, MD 60 mL/hr at 02/13/21 1018 12.5 g at 02/13/21 1018  . Chlorhexidine Gluconate Cloth 2 % PADS 6 each  6 each Topical Daily Tyler Pita, MD   6 each at 02/13/21 1020  . heparin injection 5,000 Units  5,000 Units Subcutaneous Q8H Rust-Chester, Britton L, NP   5,000 Units at 02/13/21 0600  . lactated  ringers infusion   Intravenous Continuous Tyler Pita, MD 125 mL/hr at 02/13/21 1300 Infusion Verify at 02/13/21 1300  . levothyroxine (SYNTHROID, LEVOTHROID) injection 12.5 mcg  12.5 mcg Intravenous Daily Lu Duffel, RPH   12.5 mcg at 02/13/21 1010  . midodrine (PROAMATINE) tablet 10 mg  10 mg Oral TID WC Tyler Pita, MD   10 mg at 02/13/21 1139  . norepinephrine (LEVOPHED) 45m in 2580mpremix infusion  2-10 mcg/min Intravenous Titrated GoTyler PitaMD 18.75 mL/hr at 02/13/21 1300 5 mcg/min at 02/13/21 1300  . oxyCODONE-acetaminophen (PERCOCET/ROXICET) 5-325 MG per tablet 1-2 tablet  1-2 tablet Oral Q6H PRN BlAwilda BillNP   2 tablet at 02/12/21 2020  . piperacillin-tazobactam (ZOSYN) IVPB 3.375 g  3.375 g Intravenous Q8H Shanlever, Pierce CraneRPH 12.5 mL/hr at 02/13/21 0600 3.375 g at 02/13/21 0600  . potassium chloride SA (KLOR-CON) CR tablet 20 mEq  20 mEq Oral Daily Kolluru, Sarath, MD   20 mEq at 02/13/21 1024  . sodium chloride 0.9 % bolus 500 mL  500 mL Intravenous Once Rust-Chester, BrHuel CoteNP        Allergies as of 01/25/2021  . (  No Known Allergies)     Review of Systems:    All systems reviewed and negative except where noted in HPI, with the exception of speech impediment. Follows with Dr Fletcher Anon, last lvf 30.      Physical Exam:  Vital signs in last 24 hours: Temp:  [97.4 F (36.3 C)-98.2 F (36.8 C)] 97.9 F (36.6 C) (02/21 1130) Pulse Rate:  [42-94] 51 (02/21 1300) Resp:  [9-21] 14 (02/21 1300) BP: (99-141)/(34-98) 134/46 (02/21 1300) SpO2:  [93 %-100 %] 98 % (02/21 1300) Last BM Date: 02/13/21 General:   Pleasant elderly man in NAD Head:  Normocephalic and atraumatic. Eyes:   No icterus.   Conjunctiva pink. Ears:  Normal auditory acuity. Mouth: Mucosa pink moist, no lesions. Neck:  Supple; no masses felt Lungs:  Respirations even and unlabored. Lungs clear to auscultation bilaterally.   No wheezes, crackles, or rhonchi.   Heart:  S1S2, RRR, no MRG.Sinus bradycardia Abdomen:   Distended upper abdomen; palpable and nontender. Tympany noted over RUQ. Normal bowel sounds. No appreciable masses or hepatomegaly. No rebound signs or other peritoneal signs. Rectal:  Tube present with brown watery output  Msk:  1-2+ lower leg edema. MAEW x4, No clubbing or cyanosis. Strength 5/5. Symmetrical without gross deformities. Neurologic:  Alert and  oriented x4;  Cranial nerves II-XII intact.  Skin:  Warm, dry, pink without significant lesions or rashes. Psych:  Alert and cooperative. Normal affect.  LAB RESULTS: Recent Labs    02/11/21 0624 02/12/21 0527 02/13/21 0319  WBC 10.0 11.4* 11.0*  HGB 7.9* 7.5* 7.3*  HCT 24.5* 23.9* 23.8*  PLT 210 213 211   BMET Recent Labs    02/11/21 0624 02/11/21 2259 02/12/21 0527 02/13/21 0319  NA 140  --  141 140  K 3.5 4.4 4.1 4.5  CL 107  --  113* 114*  CO2 22  --  19* 19*  GLUCOSE 98  --  97 99  BUN 72*  --  59* 53*  CREATININE 3.35*  --  2.94* 3.09*  CALCIUM 7.8*  --  8.0* 7.9*   LFT Recent Labs    02/11/21 0624  PROT 5.5*  ALBUMIN 2.1*  AST 13*  ALT 8  ALKPHOS 43  BILITOT 0.5  BILIDIR <0.1  IBILI NOT CALCULATED   PT/INR No results for input(s): LABPROT, INR in the last 72 hours.  STUDIES: No results found.     Impression / Plan:   1. Chronic Diarrhea/ acute on chronic diarrhea- history of SCAD-  May have a component of this- theoretically the zosyn should cover if so- his current illness/medications also likely contribute and he probably at least have some gut flora disruption - he was evaluated for IBD/microscopic colitis/other issues recently with negative results. Reviewed with Dr Haig Prophet, could consider mesalamine however his CKD precludes this. He is not a candidate for sedated/invasive procedures- will manage conservatively- can consider fiber, possible priobiotics prn. 2. 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 These services were provided by Stephens November, NP-C, in collaboration with Andrey Farmer, MD, with whom I have discussed this patient in full.   Stephens November, NP-C

## 2021-02-13 NOTE — Consult Note (Signed)
[facility] for Electrolyte Monitoring and Replacement   Recent Labs: Potassium (mmol/L)  Date Value  02/13/2021 4.5   Magnesium (mg/dL)  Date Value  02/13/2021 1.8   Calcium (mg/dL)  Date Value  02/13/2021 7.9 (L)   Albumin (g/dL)  Date Value  02/11/2021 2.1 (L)  06/29/2020 4.3   Phosphorus (mg/dL)  Date Value  02/13/2021 3.0   Sodium (mmol/L)  Date Value  02/13/2021 140  06/29/2020 143   Corrected Ca: 9.4 mg/dL  Assessment: Pt is a 75 yo presented to the ED on 2/15 due to a syncopal event with loss of consciousness. Suspected sepsis with septic shock due to suspected UTI in the setting of chronic cystitis. His SCr is stabilizinng  Pharmacy has been consulted to monitor and replenish electrolytes.   MIVF: lactated ringers at 125 mL/hr  Supplement: KLOR-CON 20 mEq PO daily  Goal of Therapy:  Electrolytes WNL  Plan:  - no additional replenishment warranted at this time - Will follow K, Mg, and Phos with am labs  Dallie Piles, PharmD, BCPS Clinical Pharmacist 02/13/2021 7:16 AM

## 2021-02-13 NOTE — Progress Notes (Signed)
William Holland  ROUNDING NOTE   Subjective:   Hospital Course: admitted on 2/15 for near syncope  Having large amount of liquid stools Abdomen is still distended UOP is low   S Creatinine increased today   Objective:  Vital signs in last 24 hours:  Temp:  [97.4 F (36.3 C)-98.2 F (36.8 C)] 97.7 F (36.5 C) (02/21 0400) Pulse Rate:  [42-94] 50 (02/21 0945) Resp:  [9-20] 12 (02/21 0945) BP: (95-126)/(34-108) 99/40 (02/21 0945) SpO2:  [93 %-100 %] 97 % (02/21 0945)  Weight change:  Filed Weights   01/26/2021 0912 02/11/21 0500 02/12/21 0611  Weight: 67 kg 73 kg 79 kg    Intake/Output: I/O last 3 completed shifts: In: 21 [P.O.:840; I.V.:4070; IV Piggyback:349.9] Out: 3600 [Stool:3600]   Intake/Output this shift:  Total I/O In: 109.4 [I.V.:84.4; IV Piggyback:25] Out: -   Physical Exam: General: Ill appearing  Head: Normocephalic, atraumatic. Moist oral mucosal membranes  Lungs:  Clear to auscultation  Heart: Regular rate and rhythm  Abdomen:  Soft, nontender, +distended, tympanic  Extremities:  trace peripheral edema.  Neurologic: Nonfocal, moving all four extremities  Skin: No lesions  Access:   Rectal tube External urine collection system  Basic Metabolic Panel: Recent Labs  Lab 02/09/21 0638 02/10/21 0611 02/10/21 2040 02/11/21 0624 02/11/21 2259 02/12/21 0527 02/13/21 0319  NA 141 141  --  140  --  141 140  K 3.5 2.9* 3.7 3.5 4.4 4.1 4.5  CL 109 105  --  107  --  113* 114*  CO2 21* 26  --  22  --  19* 19*  GLUCOSE 124* 120*  --  98  --  97 99  BUN 95* 84*  --  72*  --  59* 53*  CREATININE 4.03* 3.58*  --  3.35*  --  2.94* 3.09*  CALCIUM 7.9* 7.6*  --  7.8*  --  8.0* 7.9*  MG 2.3 2.1  --  2.0  --  1.8 1.8  PHOS 3.1 2.7  --  2.9  --  2.9 3.0    Liver Function Tests: Recent Labs  Lab 02/11/21 0624  AST 13*  ALT 8  ALKPHOS 43  BILITOT 0.5  PROT 5.5*  ALBUMIN 2.1*   No results for input(s): LIPASE, AMYLASE in the last 168  hours. No results for input(s): AMMONIA in the last 168 hours.  CBC: Recent Labs  Lab 02/09/21 0638 02/10/21 0611 02/11/21 0624 02/12/21 0527 02/13/21 0319  WBC 7.6 8.9 10.0 11.4* 11.0*  HGB 7.8* 7.4* 7.9* 7.5* 7.3*  HCT 23.8* 22.6* 24.5* 23.9* 23.8*  MCV 89.8 90.8 92.1 93.4 96.4  PLT 195 201 210 213 211    Cardiac Enzymes: No results for input(s): CKTOTAL, CKMB, CKMBINDEX, TROPONINI in the last 168 hours.  BNP: Invalid input(s): POCBNP  CBG: Recent Labs  Lab 02/08/21 0628  GLUCAP 102*    Microbiology: Results for orders placed or performed during the hospital encounter of 02/17/2021  Culture, blood (routine x 2)     Status: None   Collection Time: 02/09/2021 10:29 AM   Specimen: BLOOD  Result Value Ref Range Status   Specimen Description BLOOD LEFT ANTECUBITAL  Final   Special Requests   Final    BOTTLES DRAWN AEROBIC AND ANAEROBIC Blood Culture results may not be optimal due to an excessive volume of blood received in culture bottles   Culture   Final    NO GROWTH 5 DAYS Performed at [facility],  [place], [place] [address]    Report Status 02/12/2021 FINAL  Final  Culture, blood (routine x 2)     Status: None   Collection Time: 01/27/2021 10:30 AM   Specimen: BLOOD  Result Value Ref Range Status   Specimen Description BLOOD BLOOD LEFT FOREARM  Final   Special Requests   Final    BOTTLES DRAWN AEROBIC ONLY Blood Culture adequate volume   Culture   Final    NO GROWTH 5 DAYS Performed at [facility], [address], [place], [place] [address]    Report Status 02/12/2021 FINAL  Final  SARS CORONAVIRUS 2 (TAT 6-24 HRS) Nasopharyngeal Nasopharyngeal Swab     Status: None   Collection Time: 02/09/2021 12:09 PM   Specimen: Nasopharyngeal Swab  Result Value Ref Range Status   SARS Coronavirus 2 NEGATIVE NEGATIVE Final    Comment: (NOTE) SARS-CoV-2 target nucleic acids are NOT DETECTED.  The SARS-CoV-2 RNA is generally detectable  in upper and lower respiratory specimens during the acute phase of infection. Negative results do not preclude SARS-CoV-2 infection, do not rule out co-infections with other pathogens, and should not be used as the sole basis for treatment or other patient management decisions. Negative results must be combined with clinical observations, patient history, and epidemiological information. The expected result is Negative.  Fact Sheet for Patients: SugarRoll.be  Fact Sheet for Healthcare Providers: https://www.woods-mathews.com/  This test is not yet approved or cleared by the [place] FDA and  has been authorized for detection and/or diagnosis of SARS-CoV-2 by FDA under an Emergency Use Authorization (EUA). This EUA will remain  in effect (meaning this test can be used) for the duration of the COVID-19 declaration under Se ction 564(b)(1) of the Act, 21 U.S.C. section 360bbb-3(b)(1), unless the authorization is terminated or revoked sooner.  Performed at [facility], [place] [address] [place], [place] [address]   Urine Culture     Status: Abnormal   Collection Time: 02/06/2021  4:16 PM   Specimen: Urine, Random  Result Value Ref Range Status   Specimen Description   Final    URINE, RANDOM Performed at [facility], [address] [place], [place] [address]    Special Requests   Final    NONE Performed at [facility], [address] [place], [place] [address]    Culture (A)  Final    >=100,000 COLONIES/mL MULTIPLE SPECIES PRESENT, SUGGEST RECOLLECTION   Report Status 02/09/2021 FINAL  Final  MRSA PCR Screening     Status: None   Collection Time: 02/08/21  6:30 AM   Specimen: Nasopharyngeal  Result Value Ref Range Status   MRSA by PCR NEGATIVE NEGATIVE Final    Comment:        The GeneXpert MRSA Assay (FDA approved for NASAL specimens only), is one component of a comprehensive MRSA  colonization surveillance program. It is not intended to diagnose MRSA infection nor to guide or monitor treatment for MRSA infections. Performed at [facility], [address] [place], [place] [address]   Gastrointestinal Panel by PCR , Stool     Status: None   Collection Time: 02/10/21 12:36 PM   Specimen: Stool  Result Value Ref Range Status   Campylobacter species NOT DETECTED NOT DETECTED Final   Plesimonas shigelloides NOT DETECTED NOT DETECTED Final   Salmonella species NOT DETECTED NOT DETECTED Final   Yersinia enterocolitica NOT DETECTED NOT DETECTED Final   Vibrio species NOT  DETECTED NOT DETECTED Final   Vibrio cholerae NOT DETECTED NOT DETECTED Final   Enteroaggregative E coli (EAEC) NOT DETECTED NOT DETECTED Final   Enteropathogenic E coli (EPEC) NOT DETECTED NOT DETECTED Final   Enterotoxigenic E coli (ETEC) NOT DETECTED NOT DETECTED Final   Shiga like toxin producing E coli (STEC) NOT DETECTED NOT DETECTED Final   Shigella/Enteroinvasive E coli (EIEC) NOT DETECTED NOT DETECTED Final   Cryptosporidium NOT DETECTED NOT DETECTED Final   Cyclospora cayetanensis NOT DETECTED NOT DETECTED Final   Entamoeba histolytica NOT DETECTED NOT DETECTED Final   Giardia lamblia NOT DETECTED NOT DETECTED Final   Adenovirus F40/41 NOT DETECTED NOT DETECTED Final   Astrovirus NOT DETECTED NOT DETECTED Final   Norovirus GI/GII NOT DETECTED NOT DETECTED Final   Rotavirus A NOT DETECTED NOT DETECTED Final   Sapovirus (I, II, IV, and V) NOT DETECTED NOT DETECTED Final    Comment: Performed at [facility], [address] [place], [place] [address]  C Difficile Quick Screen (NO PCR Reflex)     Status: None   Collection Time: 02/10/21 12:36 PM   Specimen: STOOL  Result Value Ref Range Status   C Diff antigen NEGATIVE NEGATIVE Final   C Diff toxin NEGATIVE NEGATIVE Final   C Diff interpretation No C. difficile detected.  Final    Comment: Performed at [facility], [address] [place], [place] [address]    Coagulation Studies: No results for input(s): LABPROT, INR in the last 72 hours.  Urinalysis: No results for input(s): COLORURINE, LABSPEC, PHURINE, GLUCOSEU, HGBUR, BILIRUBINUR, KETONESUR, PROTEINUR, UROBILINOGEN, NITRITE, LEUKOCYTESUR in the last 72 hours.  Invalid input(s): APPERANCEUR    Imaging: No results found.   Medications:   . sodium chloride 250 mL (01/24/2021 1618)  . albumin human 12.5 g (02/12/21 2243)  . lactated ringers Stopped (02/13/21 0733)  . norepinephrine (LEVOPHED) Adult infusion 4 mcg/min (02/13/21 0800)  . piperacillin-tazobactam (ZOSYN)  IV 3.375 g (02/13/21 0600)  . sodium chloride     . Chlorhexidine Gluconate Cloth  6 each Topical Daily  . heparin  5,000 Units Subcutaneous Q8H  . levothyroxine  12.5 mcg Intravenous Daily  . midodrine  10 mg Oral TID WC  . potassium chloride  20 mEq Oral Daily   acetaminophen, oxyCODONE-acetaminophen  Assessment/ Plan:  Mr. Akoni Parton is a 75 y.o. white male with hypertension, coronary artery disease, congestive heart failure, history of bowel perforation,  who was admitted to [facility] on 01/26/2021 for Cystitis [N30.90]  1. 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  CT abdomen 2/15- chronic cystitis with moderate bilateral hydronephrosis with bladder calculi -chronic. Appreciate urology input.   02/20 0701 - 02/21 0700 In: 3788.6 [P.O.:840; I.V.:2748.7; IV Piggyback:199.9] Out: 2200 [Stool:2200]   Lab Results  Component Value Date   CREATININE 3.09 (H) 02/13/2021   CREATININE 2.94 (H) 02/12/2021   CREATININE 3.35 (H) 02/11/2021   S Creatinine trend appears to be stabilizing with slight up tick noted Electrolytes and Volume status are acceptable No acute indication for Dialysis at  present  Continue supportive care and maintain fluids    2. Hypotension: requiring vasopressors: norepinephrine.  Urine culture with mutliple species present on 2/15.  - empiric antibiotics: pip/tazo   3. Proteinuria UPC 3.68 K/l ratio 1.68  ANA pending  4. Chronic diarrhea May need  GI eval C Diff neg     LOS: 6 Brave Dack 2/21/20229:56 AM

## 2021-02-13 NOTE — Progress Notes (Signed)
NAME:  William Holland, MRN:  [id number], DOB:  1946-10-19, LOS: 6 ADMISSION DATE:  02/20/2021, INITIAL CONSULTATION DATE:  02/05/2021 REFERRING MD:  Dr. Cherylann Banas, CHIEF COMPLAINT: Abdominal Pain & Syncope  Brief History:  75 yo male presenting to the ED in septic shock due to UTI requiring low dose pressors, admitted to ICU.  History of Present Illness:  75 yo male presented to the ED after a syncopal event with loss of consciousness. The patient's brother reported the patient was sitting, getting his hair cut when he fell over and became unresponsive for about 15 minutes. The patient denied experiencing any symptoms prior to falling over, no dizziness/lightheadness/prodromal symptoms. Only other complaint is mild pain to light palpation of abdomen which is chronically distended. Patient denies fever/chills/cough, does admit to diarrhea but states this is chronic. Patient denies that anything like this has ever happened to him before.   ED course: Initial vitals: hypothermic- 95.4/ NSR- 96/ RR- 13/ hypotensive- 71/52 (60). Labs significant for: leukocytosis-WBC 25.1, hyperkalemic 5.6, Cl 115, serum CO2 14, BUN/Cr 110/4.92, BNP 128.4, troponin 18~20, lactic 1.1, PCT 1.87  Past Medical History:  CAD NICM Diverticulosis Chronic combined systolic & diastolic HF  Significant Hospital Events:  02/15/2021 Admission with suspected UTI, low dose levophed 2/16: Renal US with moderate bilateral hydronephrosis, multiple large bladder calculi 2/17: Urology consulted;recommedns antibiotics and follow up cultures; recommends against aggressive interventions including ureteral stents or nephrostomy tubes 2/18: Weaning Levophed, currently at 3 mcg  Consults:  Nephrology Urology  Procedures:  N/A  Significant Diagnostic Tests:  01/31/2021 CT Abdomen pelvis >>  Findings of chronic cystitis with interval development of moderate bilateral hydronephrosis. There are a few small foci of air within the left renal  pelvis. Findings raise suspicion for ascending urinary tract infection. Correlate with urinalysis. Persistent severe diffuse colonic dilatation with transition point again noted within the mid sigmoid colon. Circumferentially thickened 6 cm segment of mid sigmoid colon containing numerous diverticula. Similar degree of mild adjacent fat stranding. Findings may represent stricture related to chronic diverticulitis. Underlying colonic neoplasm not excluded. Loss of fat plane between the bladder dome and thickened sigmoid colon raises the possibility of a colovesical fistula. 01/29/2021: CT Head>>1. No acute intracranial abnormality. 2/16: Renal US>>Moderate bilateral hydronephrosis is noted. Continued presence of multiple large bladder calculi.  Micro Data:  02/06/2021 COVID 19 >>negative 01/27/2021 BC x 2 >>no growth to date 2/15: Urine>>  >=100,000 COLONIES/mL MULTIPLE SPECIES PRESENT, SUGGEST RECOLLECTIONAbnormal  2/16: MRSA PCR>>negative 2/18: GI panel>> 2/18: C.diff>>  Antimicrobials:  Cefepime 2/15 >>2/17 Flagyl 2/15 >>2/17 Vancomycin x 1 dose 2/15 Zosyn 2/17>>  Interval History:  -Continues with liquid stools -Pt is awake and alert -Hemodynamically stable, afebrile -Weaning down Levophed (currenlty at 3 mcg); starting Midodrine -Bicarb gtt being discontinued due to metabolic alkalosis -Pt noted to have large volume of diarrhea in rectal tube;  GI panel and C. Diff negative  Objective   Blood pressure (!) 128/43, pulse 60, temperature 97.9 F (36.6 C), temperature source Oral, resp. rate 19, height 5' 6"  (1.676 m), weight 79 kg, SpO2 99 %.        Intake/Output Summary (Last 24 hours) at 02/13/2021 1554 Last data filed at 02/13/2021 1500 Gross per 24 hour  Intake 3941.38 ml  Output 1600 ml  Net 2341.38 ml   Filed Weights   02/03/2021 0912 02/11/21 0500 02/12/21 0611  Weight: 67 kg 73 kg 79 kg    Examination: General: Acute on chronically ill appearing male,n NAD HEENT:  Atraumatic, normocephalic, neck supple, no JVD Pulm: Clear to auscultation bilaterally, even, nonlabored, normal effort CV: Regular rate and rhythm, s1s2, no M/R/G, 2+ distal pulses GI: Protuberant, no tenderness, no guarding or rebound tenderness, BS+ Skin: Warm and dry.  No obvious rashes, lesions, or ulcerations Extremities: No deformities, trace edema bilateral LE Neuro: Alert, oriented to person, place, and situation.  Follows commands, no focal deficits, speech clear, pupils PERRL Resolved Hospital Problem list   N/A  Assessment & Plan:   Septic Shock in the setting of UTI HFrEF- LVEF 30-35% Hx: NICM, CAD -Continuous cardiac monitoring -Maintain MAP 60-65 -Gentle IV fluids -Levophed as needed to maintain MAP goal -Started midodrine, BP seems to be holding well over 60 -Home medications on hold in the setting of hypotension & AKI on CKD: metoprolol, losartan, torsemide. - TSH 9.773, cortisol 11   Severe Sepsis Due to suspected UTI in the setting of chronic cystitis Lactic: 1.1, Baseline PCT: 1.87, UA: had large leukocytes, multi-flora CXR: chronic without acute disease, CT Abdomen: concern for UTI Cefepime/ Vancomycin/ Metronidazole -Monitor fever curve -Trend mild leukocytosis and procalcitonin -Follow cultures as above -Continue IV Zosyn pending cultures and sensitiviites   Acute Kidney Injury on CKD Stage IV (In the setting of shock. Baseline creatinine 2.36-2.57, creatinine on admission 4.92) Bilateral Hydronephrosis Hypokalemia Metabolic Acidosis>>resolved Metabolic Alkalosis -Monitor I&O's / urinary output -Follow BMP -Ensure adequate renal perfusion -Avoid nephrotoxic agents as able -Replace electrolytes as indicated -Nephrology following, appreciate input -Bicarb gtt d/c by Nephrology due to metabolic alkalosis -Starting NS w/ KCL -Urology consulted, did not recommend any aggressive intervention with ureteral stents or nephrostomy  tubes   Anemia -Monitor for S/Sx of bleeding -Trend CBC -Heparin SQ for VTE Prophylaxis  -Transfuse for Hgb <7      Best practice (evaluated daily)  Diet: Heart healthy Pain/Anxiety/Delirium protocol (if indicated): N/a VAP protocol (if indicated): n/a DVT prophylaxis: heparin SQ GI prophylaxis: N/a Glucose control: monitor as needed Mobility: bedrest, mobilize as tolerated Disposition:ICU  Goals of Care:  Last date of multidisciplinary goals of care discussion:02/10/21 Family and staff present: Pt and bedside RN; no family available Summary of discussion: plan of care including antibiotics & vasopressors Follow up goals of care discussion due: 02/11/21 Code Status: FULL  Labs   CBC: Recent Labs  Lab 02/09/21 0638 02/10/21 0611 02/11/21 0624 02/12/21 0527 02/13/21 0319  WBC 7.6 8.9 10.0 11.4* 11.0*  HGB 7.8* 7.4* 7.9* 7.5* 7.3*  HCT 23.8* 22.6* 24.5* 23.9* 23.8*  MCV 89.8 90.8 92.1 93.4 96.4  PLT 195 201 210 213 258    Basic Metabolic Panel: Recent Labs  Lab 02/09/21 0638 02/10/21 0611 02/10/21 2040 02/11/21 0624 02/11/21 2259 02/12/21 0527 02/13/21 0319  NA 141 141  --  140  --  141 140  K 3.5 2.9* 3.7 3.5 4.4 4.1 4.5  CL 109 105  --  107  --  113* 114*  CO2 21* 26  --  22  --  19* 19*  GLUCOSE 124* 120*  --  98  --  97 99  BUN 95* 84*  --  72*  --  59* 53*  CREATININE 4.03* 3.58*  --  3.35*  --  2.94* 3.09*  CALCIUM 7.9* 7.6*  --  7.8*  --  8.0* 7.9*  MG 2.3 2.1  --  2.0  --  1.8 1.8  PHOS 3.1 2.7  --  2.9  --  2.9 3.0   GFR: Estimated Creatinine Clearance: 20.7 mL/min (A) (  by C-G formula based on SCr of 3.09 mg/dL (H)). Recent Labs  Lab 02/20/2021 1029 02/04/2021 1030 01/30/2021 1703 02/08/21 0520 02/10/21 7782 02/11/21 0624 02/12/21 0527 02/13/21 0319  PROCALCITON 1.87  --   --   --   --   --   --   --   WBC  --   --   --    < > 8.9 10.0 11.4* 11.0*  LATICACIDVEN  --  1.1 0.7  --   --   --   --   --    < > = values in this interval not  displayed.    Liver Function Tests: Recent Labs  Lab 02/11/21 0624  AST 13*  ALT 8  ALKPHOS 43  BILITOT 0.5  PROT 5.5*  ALBUMIN 2.1*   No results for input(s): LIPASE, AMYLASE in the last 168 hours. No results for input(s): AMMONIA in the last 168 hours.  ABG No results found for: PHART, PCO2ART, PO2ART, HCO3, TCO2, ACIDBASEDEF, O2SAT   Coagulation Profile: No results for input(s): INR, PROTIME in the last 168 hours.  Cardiac Enzymes: No results for input(s): CKTOTAL, CKMB, CKMBINDEX, TROPONINI in the last 168 hours.  HbA1C: Hgb A1c MFr Bld  Date/Time Value Ref Range Status  01/05/2019 04:37 PM 5.2 4.8 - 5.6 % Final    Comment:    (NOTE) Pre diabetes:          5.7%-6.4% Diabetes:              >6.4% Glycemic control for   <7.0% adults with diabetes     CBG: Recent Labs  Lab 02/08/21 0628  GLUCAP 102*    Review of Systems: POSITIVES in BOLD  Gen: Denies fever, chills, weight change, fatigue, night sweats HEENT: Denies blurred vision, double vision, hearing loss, tinnitus, sinus congestion, rhinorrhea, sore throat, neck stiffness, dysphagia PULM: Denies shortness of breath, cough, sputum production, hemoptysis, wheezing CV: Denies chest pain, edema, orthopnea, paroxysmal nocturnal dyspnea, palpitations GI: Denies abdominal pain, nausea, vomiting, diarrhea, hematochezia, melena, constipation, change in bowel habits GU: Denies dysuria, hematuria, polyuria, oliguria, urethral discharge Endocrine: Denies hot or cold intolerance, polyuria, polyphagia or appetite change Derm: Denies rash, dry skin, scaling or peeling skin change Heme: Denies easy bruising, bleeding, bleeding gums Neuro: Denies headache, numbness, weakness, slurred speech, loss of memory or consciousness  Past Medical History:  He,  has a past medical history of Anemia, Bowel perforation (HCC), Chronic combined systolic (congestive) and diastolic (congestive) heart failure (HCC), Chronic diarrhea,  Diverticulosis, sigmoid (02/27/2018), NICM (nonischemic cardiomyopathy) ([place]), Nonobstructive CAD (coronary artery disease), and Stuttering during school years.   Surgical History:   Past Surgical History:  Procedure Laterality Date  . APPENDECTOMY    . COLONOSCOPY WITH PROPOFOL N/A 02/27/2018   Procedure: COLONOSCOPY WITH PROPOFOL;  Surgeon: Lin Landsman, MD;  Location[facility] ENDOSCOPY;  Service: Gastroenterology;  Laterality: N/A;  . EYE SURGERY    . RIGHT/LEFT HEART CATH AND CORONARY ANGIOGRAPHY N/A 02/09/2019   Procedure: RIGHT/LEFT HEART CATH AND CORONARY ANGIOGRAPHY;  Surgeon: Wellington Hampshire, MD;  Location: [facility] LAB;  Service: Cardiovascular;  Laterality: N/A;     Social History:   reports that he has never smoked. He has never used smokeless tobacco. He reports that he does not drink alcohol and does not use drugs.   Family History:  His family history includes Cancer in his brother and father; Uterine cancer in his mother.   Allergies No Known Allergies  Home Medications  Prior to Admission medications   Medication Sig Start Date End Date Taking? Authorizing Provider  losartan (COZAAR) 25 MG tablet Take 12.5 mg by mouth daily. 07/17/20  Yes [provider]  metoprolol succinate (TOPROL XL) 25 MG 24 hr tablet Take 0.5 tablets (12.5 mg total) by mouth daily. 01/23/21  Yes Wellington Hampshire, MD  potassium chloride (KLOR-CON) 10 MEQ tablet Take 2 tablets (20 mEq total) by mouth daily as needed (when taking fluid pill). Patient taking differently: Take 20 mEq by mouth daily. 07/15/20  Yes Dunn, Areta Haber, PA-C  torsemide (DEMADEX) 20 MG tablet Take 1 tablet (20 mg total) each morning. Take additional dose (20 mg total) on Monday and Thursday afternoons Patient taking differently: Take 20 mg by mouth daily. 09/01/20  Yes Rise Mu, PA-C     Level 3 follow-up     BRESCIA

## 2021-02-13 NOTE — Consult Note (Signed)
Consultation Note Date: 02/13/2021   Patient Name: William Holland  DOB: 10/24/1946  MRN: [id number]  Age / Sex: 75 y.o., male  PCP: Patient, No Pcp Per Referring Physician: Nelle Don, MD  Reason for Consultation: Establishing goals of care and Psychosocial/spiritual support  HPI/Patient Profile: 75 y.o. male  with past medical history of chronic combined systolic/diastolic heart failure, diverticulosis, CAD, recurrent UTIs/cystitis admitted on 02/20/2021 with septic shock with ARF.   Clinical Assessment and Goals of Care: I have reviewed medical records including EPIC notes, labs and imaging, received report from CCM attending and bedside nursing staff, examined the patient.  Mr. [name] is sitting up in bed.  He greets me making and somewhat keeping eye contact.  He appears older than stated age, somewhat frail.  He is alert and oriented to person and situation, able to make his basic needs known.  There is no family at bedside at this time.    Mr. [name] and I talked about his acute health concerns, recurrent UTIs/cystitis.  We talked about the treatment plan.  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  He tells me that he never married, never had children, never worked.  He lives with his nephew.  He tells me that he is able to do his own bathing and dressing.  He states that he would not want to go to short-term rehab, but instead desires to go home.  Mr. [name] names his brother, William Holland, as his healthcare surrogate.  We do not discuss CODE STATUS.  Call to brother, William Holland, to discuss diagnosis prognosis, [place], EOL wishes, disposition and options.  William Holland tells me that he just got a phone call from his home stating that there is an emergency at home, he says he is unsure what has happened, but he has 2 great-grandchildren living in his home and he is quite worried.   I shared that there is no emergency for [place] to talk, I just wanted to touch base with him.     Conference with CCM attending, bedside nursing staff, transition of care team related to patient condition, goals of care, family conversations.  PMT to reach out to brother/HC POA, William Holland, for continued goals of care discussions, CODE STATUS discussions.  HCPOA   NEXT OF KIN -[name] names his brother, William Holland as his healthcare surrogate.  He tells me he has never married, has no children.  Mr. [name] shares that he had a sister who died last year, and he has 2 brothers William Holland and William Holland.    SUMMARY OF RECOMMENDATIONS   At this point continue to treat the treatable PMT to hold CODE STATUS discussions with brother   Code Status/Advance Care Planning:  Full code -CODE STATUS not discussed with patient.  PMT to hold CODE STATUS discussions with brother, [name].  Symptom Management:   Per hospitalist, no additional needs at this time.  Palliative Prophylaxis:   Frequent Pain Assessment, Oral Care and Turn Reposition  Additional Recommendations (Limitations, Scope, Preferences):  At this point continue full scope/full code.  PMT to hold discussions related to HD.  Psycho-social/Spiritual:   Desire for further Chaplaincy support:no  Additional Recommendations: Caregiving  Support/Resources  Prognosis:   Unable to determine, based on outcomes.  6 to 12 months or more to would not be surprising based on 1 hospital visit in the last 6 months, relatively young age, functional status.  This may be limited by chronic UTIs.  Discharge Planning: To be determined, based on outcomes.  At this point states his preference is to return home.      Primary Diagnoses: Present on Admission: . Chronic combined systolic and diastolic CHF, NYHA class 2 ([place]) . Severe sepsis with septic shock ([place]) . CAD (coronary artery disease) . Anemia in chronic kidney disease . Acute renal  failure superimposed on stage 4 chronic kidney disease ([place]) . Syncope . Hydronephrosis, bilateral . Hyperkalemia . Elevated troponin . UTI (urinary tract infection) . Cystitis   I have reviewed the medical record, interviewed the patient and family, and examined the patient. The following aspects are pertinent.  Past Medical History:  Diagnosis Date  . Anemia   . Bowel perforation ([place])   . Chronic combined systolic (congestive) and diastolic (congestive) heart failure (HCC)    a. 12/2018 Echo: EF of 30 to 35%, diffuse hypokinesis, grade 2 diastolic dysfunction, mild mitral regurgitation, moderately dilated left atrium, moderately reduced RV systolic function, trivial pericardial effusion was identified, along with a right pleural effusion  . Chronic diarrhea   . Diverticulosis, sigmoid 02/27/2018   Colonoscopy done on 02/27/2018 by Dr. Marius Ditch  . NICM (nonischemic cardiomyopathy) ([place])    a. 12/2018 Echo: Ef 30-35%, diff HK, Gr2 DD.  Marland Kitchen Nonobstructive CAD (coronary artery disease)    a. 01/2019 Cath: LM nl, LAD nl, LCX nl, RCA min irregs.  . Stuttering during school years    Social History   Socioeconomic History  . Marital status: Single    Spouse name: Not on file  . Number of children: Not on file  . Years of education: Not on file  . Highest education level: Not on file  Occupational History  . Occupation: retired  Tobacco Use  . Smoking status: Never Smoker  . Smokeless tobacco: Never Used  Vaping Use  . Vaping Use: Never used  Substance and Sexual Activity  . Alcohol use: No    Alcohol/week: 0.0 standard drinks  . Drug use: No  . Sexual activity: Not on file    Comment: Not Asked  Other Topics Concern  . Not on file  Social History Narrative   Lives at home with his nephew.  Unsteady gait.   Social Determinants of Health   Financial Resource Strain: Not on file  Food Insecurity: Not on file  Transportation Needs: Not on file  Physical Activity: Not on file   Stress: Not on file  Social Connections: Not on file   Family History  Problem Relation Age of Onset  . Cancer Brother   . Cancer Father   . Uterine cancer Mother    Scheduled Meds: . Chlorhexidine Gluconate Cloth  6 each Topical Daily  . heparin  5,000 Units Subcutaneous Q8H  . levothyroxine  12.5 mcg Intravenous Daily  . midodrine  10 mg Oral TID WC  . potassium chloride  20 mEq Oral Daily   Continuous Infusions: . sodium chloride 250 mL (01/28/2021 1618)  . albumin human 12.5 g (02/13/21 1018)  .  lactated ringers 125 mL/hr at 02/13/21 1300  . norepinephrine (LEVOPHED) Adult infusion 5 mcg/min (02/13/21 1300)  . piperacillin-tazobactam (ZOSYN)  IV 3.375 g (02/13/21 0600)  . sodium chloride     PRN Meds:.acetaminophen, oxyCODONE-acetaminophen Medications Prior to Admission:  Prior to Admission medications   Medication Sig Start Date End Date Taking? Authorizing Provider  losartan (COZAAR) 25 MG tablet Take 12.5 mg by mouth daily. 07/17/20  Yes [provider]  metoprolol succinate (TOPROL XL) 25 MG 24 hr tablet Take 0.5 tablets (12.5 mg total) by mouth daily. 01/23/21  Yes Wellington Hampshire, MD  potassium chloride (KLOR-CON) 10 MEQ tablet Take 2 tablets (20 mEq total) by mouth daily as needed (when taking fluid pill). Patient taking differently: Take 20 mEq by mouth daily. 07/15/20  Yes Dunn, Areta Haber, PA-C  torsemide (DEMADEX) 20 MG tablet Take 1 tablet (20 mg total) each morning. Take additional dose (20 mg total) on Monday and Thursday afternoons Patient taking differently: Take 20 mg by mouth daily. 09/01/20  Yes Dunn, Areta Haber, PA-C   No Known Allergies Review of Systems  Unable to perform ROS: Other    Physical Exam Vitals and nursing note reviewed.  Constitutional:      General: He is not in acute distress.    Appearance: He is not ill-appearing.  HENT:     Head: Normocephalic and atraumatic.  Cardiovascular:     Rate and Rhythm: Bradycardia present.   Pulmonary:     Effort: Pulmonary effort is normal. No respiratory distress.  Abdominal:     General: Abdomen is protuberant.     Palpations: Abdomen is soft.  Skin:    General: Skin is warm and dry.  Neurological:     Mental Status: He is alert.     Comments: Oriented to person and situation  Psychiatric:     Comments: Calm and cooperative, not fearful     Vital Signs: BP (!) 134/46   Pulse (!) 51   Temp 97.9 F (36.6 C) (Oral)   Resp 14   Ht 5' 6"  (1.676 m)   Wt 79 kg   SpO2 98%   BMI 28.11 kg/m  Pain Scale: 0-10   Pain Score: 0-No pain   SpO2: SpO2: 98 % O2 Device:SpO2: 98 % O2 Flow Rate: .   IO: Intake/output summary:   Intake/Output Summary (Last 24 hours) at 02/13/2021 1341 Last data filed at 02/13/2021 1300 Gross per 24 hour  Intake 4314.04 ml  Output 1600 ml  Net 2714.04 ml    LBM: Last BM Date: 02/13/21 Baseline Weight: Weight: 67 kg Most recent weight: Weight: 79 kg     Palliative Assessment/Data:   Flowsheet Rows   Flowsheet Row Most Recent Value  Intake Tab   Referral Department Hospitalist  Unit at Time of Referral ICU  Palliative Care Primary Diagnosis Sepsis/Infectious Disease  Date Notified 02/10/21  Palliative Care Type New Palliative care  Reason for referral Clarify Goals of Care  Date of Admission 02/05/2021  Date first seen by Palliative Care 02/13/21  # of days Palliative referral response time 3 Day(s)  # of days IP prior to Palliative referral 3  Clinical Assessment   Palliative Performance Scale Score 40%  Pain Max last 24 hours Not able to report  Pain Min Last 24 hours Not able to report  Dyspnea Max Last 24 Hours Not able to report  Dyspnea Min Last 24 hours Not able to report  Psychosocial & Spiritual Assessment  Palliative Care Outcomes       Time In: 1300 Time Out: 1350 Time Total: 50 minutes Greater than 50%  of this time was spent counseling and coordinating care related to the above assessment and  plan.  Signed by: Drue Novel, NP   Please contact Palliative Medicine Team phone at [phone] for questions and concerns.  For individual provider: See Shea Evans

## 2021-02-13 NOTE — Plan of Care (Signed)
Handoff report given to oncoming RN [place]. See epic for charting  Relinquishing care Problem: Education: Goal: Knowledge of General Education information will improve Description: Including pain rating scale, medication(s)/side effects and non-pharmacologic comfort measures Outcome: Progressing   Problem: Health Behavior/Discharge Planning: Goal: Ability to manage health-related needs will improve Outcome: Progressing   Problem: Clinical Measurements: Goal: Ability to maintain clinical measurements within normal limits will improve Outcome: Progressing Goal: Will remain free from infection Outcome: Progressing Goal: Diagnostic test results will improve Outcome: Progressing Goal: Respiratory complications will improve Outcome: Progressing Goal: Cardiovascular complication will be avoided Outcome: Progressing   Problem: Activity: Goal: Risk for activity intolerance will decrease Outcome: Progressing   Problem: Nutrition: Goal: Adequate nutrition will be maintained Outcome: Progressing   Problem: Coping: Goal: Level of anxiety will decrease Outcome: Progressing   Problem: Elimination: Goal: Will not experience complications related to bowel motility Outcome: Progressing Goal: Will not experience complications related to urinary retention Outcome: Progressing   Problem: Pain Managment: Goal: General experience of comfort will improve Outcome: Progressing   Problem: Safety: Goal: Ability to remain free from injury will improve Outcome: Progressing   Problem: Skin Integrity: Goal: Risk for impaired skin integrity will decrease Outcome: Progressing

## 2021-02-14 DIAGNOSIS — N170 Acute kidney failure with tubular necrosis: Secondary | ICD-10-CM | POA: Diagnosis not present

## 2021-02-14 DIAGNOSIS — R6521 Severe sepsis with septic shock: Secondary | ICD-10-CM | POA: Diagnosis not present

## 2021-02-14 DIAGNOSIS — Z7189 Other specified counseling: Secondary | ICD-10-CM | POA: Diagnosis not present

## 2021-02-14 DIAGNOSIS — Z515 Encounter for palliative care: Secondary | ICD-10-CM | POA: Diagnosis not present

## 2021-02-14 DIAGNOSIS — A419 Sepsis, unspecified organism: Secondary | ICD-10-CM | POA: Diagnosis not present

## 2021-02-14 LAB — TYPE AND SCREEN

## 2021-02-14 LAB — ABO/RH: ABO/RH(D): A POS

## 2021-02-14 LAB — CBC
HCT: 22.9 % — ABNORMAL LOW (ref 39.0–52.0)
Hemoglobin: 7.2 g/dL — ABNORMAL LOW (ref 13.0–17.0)
MCH: 29.9 pg (ref 26.0–34.0)
MCHC: 31.4 g/dL (ref 30.0–36.0)
MCV: 95 fL (ref 80.0–100.0)
Platelets: 210 10*3/uL (ref 150–400)
RBC: 2.41 MIL/uL — ABNORMAL LOW (ref 4.22–5.81)
RDW: 14.9 % (ref 11.5–15.5)
WBC: 11.6 10*3/uL — ABNORMAL HIGH (ref 4.0–10.5)
nRBC: 0 % (ref 0.0–0.2)

## 2021-02-14 LAB — BASIC METABOLIC PANEL
Anion gap: 6 (ref 5–15)
BUN: 46 mg/dL — ABNORMAL HIGH (ref 8–23)
CO2: 18 mmol/L — ABNORMAL LOW (ref 22–32)
Calcium: 8.2 mg/dL — ABNORMAL LOW (ref 8.9–10.3)
Chloride: 116 mmol/L — ABNORMAL HIGH (ref 98–111)
Creatinine, Ser: 2.88 mg/dL — ABNORMAL HIGH (ref 0.61–1.24)
GFR, Estimated: 22 mL/min — ABNORMAL LOW (ref >60)
Glucose, Bld: 89 mg/dL (ref 70–99)
Potassium: 4.4 mmol/L (ref 3.5–5.1)
Sodium: 140 mmol/L (ref 135–145)

## 2021-02-14 LAB — ANA W/REFLEX IF POSITIVE: Anti Nuclear Antibody (ANA): NEGATIVE

## 2021-02-14 LAB — MAGNESIUM: Magnesium: 1.6 mg/dL — ABNORMAL LOW (ref 1.7–2.4)

## 2021-02-14 LAB — PHOSPHORUS: Phosphorus: 3.2 mg/dL (ref 2.5–4.6)

## 2021-02-14 MED ORDER — MAGNESIUM SULFATE 4 GM/100ML IV SOLN
4.0000 g | Freq: Once | INTRAVENOUS | Status: AC
  Administered 2021-02-14: 4 g via INTRAVENOUS
  Filled 2021-02-14 (×2): qty 100

## 2021-02-14 MED ORDER — PSYLLIUM 95 % PO PACK
1.0000 | PACK | Freq: Every day | ORAL | Status: DC
  Administered 2021-02-14 – 2021-02-18 (×5): 1 via ORAL
  Filled 2021-02-14 (×6): qty 1

## 2021-02-14 MED ORDER — SODIUM CHLORIDE 0.9% IV SOLUTION: Freq: Once | INTRAVENOUS | Status: DC

## 2021-02-14 NOTE — Progress Notes (Signed)
NAME:  William Holland, MRN:  [id number], DOB:  [birth year], LOS: 7 ADMISSION DATE:  02/01/2021, INITIAL CONSULTATION DATE:  01/28/2021 REFERRING MD:  Dr. Cherylann Banas, CHIEF COMPLAINT: Abdominal Pain & Syncope  Brief History:  75 yo male presenting to the ED in septic shock due to UTI requiring low dose pressors, admitted to ICU.  History of Present Illness:  75 yo male presented to the ED after a syncopal event with loss of consciousness. The patient's brother reported the patient was sitting, getting his hair cut when he fell over and became unresponsive for about 15 minutes. The patient denied experiencing any symptoms prior to falling over, no dizziness/lightheadness/prodromal symptoms. Only other complaint is mild pain to light palpation of abdomen which is chronically distended. Patient denies fever/chills/cough, does admit to diarrhea but states this is chronic. Patient denies that anything like this has ever happened to him before.   ED course: Initial vitals: hypothermic- 95.4/ NSR- 96/ RR- 13/ hypotensive- 71/52 (60). Labs significant for: leukocytosis-WBC 25.1, hyperkalemic 5.6, Cl 115, serum CO2 14, BUN/Cr 110/4.92, BNP 128.4, troponin 18~20, lactic 1.1, PCT 1.87  Past Medical History:  CAD NICM Diverticulosis Chronic combined systolic & diastolic HF  Significant Hospital Events:  02/11/2021 Admission with suspected UTI, low dose levophed 2/16: Renal US with moderate bilateral hydronephrosis, multiple large bladder calculi 2/17: Urology consulted;recommedns antibiotics and follow up cultures; recommends against aggressive interventions including ureteral stents or nephrostomy tubes 2/18: Weaning Levophed, currently at 3 mcg  Consults:  Nephrology Urology  Procedures:  N/A  Significant Diagnostic Tests:  02/16/2021 CT Abdomen pelvis >>  Findings of chronic cystitis with interval development of moderate bilateral hydronephrosis. There are a few small foci of air within the left renal  pelvis. Findings raise suspicion for ascending urinary tract infection. Correlate with urinalysis. Persistent severe diffuse colonic dilatation with transition point again noted within the mid sigmoid colon. Circumferentially thickened 6 cm segment of mid sigmoid colon containing numerous diverticula. Similar degree of mild adjacent fat stranding. Findings may represent stricture related to chronic diverticulitis. Underlying colonic neoplasm not excluded. Loss of fat plane between the bladder dome and thickened sigmoid colon raises the possibility of a colovesical fistula. 02/06/2021: CT Head>>1. No acute intracranial abnormality. 2/16: Renal US>>Moderate bilateral hydronephrosis is noted. Continued presence of multiple large bladder calculi.  Micro Data:  02/14/2021 COVID 19 >>negative 02/15/2021 BC x 2 >>no growth to date 2/15: Urine>>  >=100,000 COLONIES/mL MULTIPLE SPECIES PRESENT, SUGGEST RECOLLECTIONAbnormal  2/16: MRSA PCR>>negative 2/18: GI panel>> 2/18: C.diff>>  Antimicrobials:  Cefepime 2/15 >>2/17 Flagyl 2/15 >>2/17 Vancomycin x 1 dose 2/15 Zosyn 2/17>>  Interval History:  -Continues with liquid stools -Pt is awake and alert -Hemodynamically stable, afebrile -Weaning down Levophed (currenlty at 3 mcg); starting Midodrine -Bicarb gtt being discontinued due to metabolic alkalosis -Pt noted to have large volume of diarrhea in rectal tube;  GI panel and C. Diff negative  Objective   Blood pressure 126/76, pulse 86, temperature 98.1 F (36.7 C), temperature source Oral, resp. rate 17, height 5' 6"  (1.676 m), weight 79 kg, SpO2 99 %.        Intake/Output Summary (Last 24 hours) at 02/14/2021 1730 Last data filed at 02/14/2021 1600 Gross per 24 hour  Intake 3598.59 ml  Output 1000 ml  Net 2598.59 ml   Filed Weights   02/02/2021 0912 02/11/21 0500 02/12/21 0611  Weight: 67 kg 73 kg 79 kg    Examination: General: Acute on chronically ill appearing male,n NAD HEENT:  Atraumatic, normocephalic, neck supple, no JVD Pulm: Clear to auscultation bilaterally, even, nonlabored, normal effort CV: Regular rate and rhythm, s1s2, no M/R/G, 2+ distal pulses GI: Protuberant, no tenderness, no guarding or rebound tenderness, BS+ Skin: Warm and dry.  No obvious rashes, lesions, or ulcerations Extremities: No deformities, trace edema bilateral LE Neuro: Alert, oriented to person, place, and situation.  Follows commands, no focal deficits, speech clear, pupils PERRL Resolved Hospital Problem list   N/A  Assessment & Plan:  RESP: Stable CXR wihtout lobar infiltrate Room air  ID: Septic Shock in the setting of UTI HFrEF- LVEF 30-35% Hx: NICM, CAD -Remains low dose of levophed -Maintain MAP 60-65 -Continuous cardiac monitoring -Gentle IV fluids -Started midodrine, BP seems to be holding well over 60 -Home medications on hold in the setting of hypotension & AKI on CKD: metoprolol, losartan, torsemide. - TSH 9.773, trend from weekend -Cortisol 11  Severe Sepsis Due to suspected UTI in the setting of chronic cystitis  Lactic: 1.1, Baseline PCT: 1.87, UA: had large leukocytes, multi-flora CXR: chronic without acute disease, CT Abdomen: concern for UTI Cefepime/ Vancomycin/ Metronidazole -Monitor fever curve -Trend mild leukocytosis and procalcitonin -Follow cultures as above -Continue IV Zosyn pending cultures and sensitiviites  RENAL: Lab Results  Component Value Date   CREATININE 2.88 (H) 02/14/2021   BUN 46 (H) 02/14/2021   NA 140 02/14/2021   K 4.4 02/14/2021   CL 116 (H) 02/14/2021   CO2 18 (L) 02/14/2021    Intake/Output Summary (Last 24 hours) at 02/14/2021 1732 Last data filed at 02/14/2021 1600 Gross per 24 hour  Intake 3598.59 ml  Output 1000 ml  Net 2598.59 ml    Net IO Since Admission: 12,515.61 mL [02/14/21 1732]  Acute Kidney Injury on CKD Stage IV (In the setting of shock. Baseline creatinine 2.36-2.57, creatinine on admission  4.92) Bilateral Hydronephrosis Hypokalemia Metabolic Acidosis>>resolved Metabolic Alkalosis -Monitor I&O's / urinary output -Follow BMP -Ensure adequate renal perfusion -Avoid nephrotoxic agents as able -Replace electrolytes as indicated -Nephrology following, appreciate input -Bicarb gtt d/c by Nephrology due to metabolic alkalosis -Starting NS w/ KCL -Urology consulted, did not recommend any aggressive intervention with ureteral stents or nephrostomy tubes  GI: SCAD Appreciate GI eval recommendations Try psyllium and prednisone Chronic component of current issue likely largest problem KUB   HEME: Anemia -Monitor for S/Sx of bleeding -Approaching transfusion -Trend CBC -Heparin SQ for VTE Prophylaxis  -Transfuse for Hgb <7      Best practice (evaluated daily)  Diet: Heart healthy Pain/Anxiety/Delirium protocol (if indicated): N/a VAP protocol (if indicated): n/a DVT prophylaxis: heparin SQ GI prophylaxis: N/a Glucose control: monitor as needed Mobility: bedrest, mobilize as tolerated Disposition:ICU  Goals of Care:  Last date of multidisciplinary goals of care discussion:02/10/21 Family and staff present: Pt and bedside RN; no family available Summary of discussion: plan of care including antibiotics & vasopressors Follow up goals of care discussion due: 02/11/21 Code Status: FULL  Labs   CBC: Recent Labs  Lab 02/10/21 0611 02/11/21 0624 02/12/21 0527 02/13/21 0319 02/14/21 0419  WBC 8.9 10.0 11.4* 11.0* 11.6*  HGB 7.4* 7.9* 7.5* 7.3* 7.2*  HCT 22.6* 24.5* 23.9* 23.8* 22.9*  MCV 90.8 92.1 93.4 96.4 95.0  PLT 201 210 213 211 790    Basic Metabolic Panel: Recent Labs  Lab 02/10/21 0611 02/10/21 2040 02/11/21 0624 02/11/21 2259 02/12/21 0527 02/13/21 0319 02/14/21 0419  NA 141  --  140  --  141 140 140  K 2.9*   < >  3.5 4.4 4.1 4.5 4.4  CL 105  --  107  --  113* 114* 116*  CO2 26  --  22  --  19* 19* 18*  GLUCOSE 120*  --  98  --  97 99 89   BUN 84*  --  72*  --  59* 53* 46*  CREATININE 3.58*  --  3.35*  --  2.94* 3.09* 2.88*  CALCIUM 7.6*  --  7.8*  --  8.0* 7.9* 8.2*  MG 2.1  --  2.0  --  1.8 1.8 1.6*  PHOS 2.7  --  2.9  --  2.9 3.0 3.2   < > = values in this interval not displayed.   GFR: Estimated Creatinine Clearance: 22.2 mL/min (A) (by C-G formula based on SCr of 2.88 mg/dL (H)). Recent Labs  Lab 02/11/21 0624 02/12/21 0527 02/13/21 0319 02/14/21 0419  WBC 10.0 11.4* 11.0* 11.6*    Liver Function Tests: Recent Labs  Lab 02/11/21 0624  AST 13*  ALT 8  ALKPHOS 43  BILITOT 0.5  PROT 5.5*  ALBUMIN 2.1*   No results for input(s): LIPASE, AMYLASE in the last 168 hours. No results for input(s): AMMONIA in the last 168 hours.  ABG No results found for: PHART, PCO2ART, PO2ART, HCO3, TCO2, ACIDBASEDEF, O2SAT   Coagulation Profile: No results for input(s): INR, PROTIME in the last 168 hours.  Cardiac Enzymes: No results for input(s): CKTOTAL, CKMB, CKMBINDEX, TROPONINI in the last 168 hours.  HbA1C: Hgb A1c MFr Bld  Date/Time Value Ref Range Status  01/05/2019 04:37 PM 5.2 4.8 - 5.6 % Final    Comment:    (NOTE) Pre diabetes:          5.7%-6.4% Diabetes:              >6.4% Glycemic control for   <7.0% adults with diabetes     CBG: Recent Labs  Lab 02/08/21 0628  GLUCAP 102*    Review of Systems: POSITIVES in BOLD  Gen: Denies fever, chills, weight change, fatigue, night sweats HEENT: Denies blurred vision, double vision, hearing loss, tinnitus, sinus congestion, rhinorrhea, sore throat, neck stiffness, dysphagia PULM: Denies shortness of breath, cough, sputum production, hemoptysis, wheezing CV: Denies chest pain, edema, orthopnea, paroxysmal nocturnal dyspnea, palpitations GI: Denies abdominal pain, nausea, vomiting, diarrhea, hematochezia, melena, constipation, change in bowel habits GU: Denies dysuria, hematuria, polyuria, oliguria, urethral discharge Endocrine: Denies hot or cold  intolerance, polyuria, polyphagia or appetite change Derm: Denies rash, dry skin, scaling or peeling skin change Heme: Denies easy bruising, bleeding, bleeding gums Neuro: Denies headache, numbness, weakness, slurred speech, loss of memory or consciousness  Past Medical History:  He,  has a past medical history of Anemia, Bowel perforation (HCC), Chronic combined systolic (congestive) and diastolic (congestive) heart failure (HCC), Chronic diarrhea, Diverticulosis, sigmoid (02/27/2018), NICM (nonischemic cardiomyopathy) ([place]), Nonobstructive CAD (coronary artery disease), and Stuttering during school years.   Surgical History:   Past Surgical History:  Procedure Laterality Date  . APPENDECTOMY    . COLONOSCOPY WITH PROPOFOL N/A 02/27/2018   Procedure: COLONOSCOPY WITH PROPOFOL;  Surgeon: Lin Landsman, MD;  Location: [facility] ENDOSCOPY;  Service: Gastroenterology;  Laterality: N/A;  . EYE SURGERY    . RIGHT/LEFT HEART CATH AND CORONARY ANGIOGRAPHY N/A 02/09/2019   Procedure: RIGHT/LEFT HEART CATH AND CORONARY ANGIOGRAPHY;  Surgeon: Wellington Hampshire, MD;  Location: [facility];  Service: Cardiovascular;  Laterality: N/A;     Social History:   reports that  he has never smoked. He has never used smokeless tobacco. He reports that he does not drink alcohol and does not use drugs.   Family History:  His family history includes Cancer in his brother and father; Uterine cancer in his mother.   Allergies No Known Allergies   Home Medications  Prior to Admission medications   Medication Sig Start Date End Date Taking? Authorizing Provider  losartan (COZAAR) 25 MG tablet Take 12.5 mg by mouth daily. 07/17/20  Yes [provider]  metoprolol succinate (TOPROL XL) 25 MG 24 hr tablet Take 0.5 tablets (12.5 mg total) by mouth daily. 01/23/21  Yes Wellington Hampshire, MD  potassium chloride (KLOR-CON) 10 MEQ tablet Take 2 tablets (20 mEq total) by mouth daily as needed (when taking  fluid pill). Patient taking differently: Take 20 mEq by mouth daily. 07/15/20  Yes Dunn, Areta Haber, PA-C  torsemide (DEMADEX) 20 MG tablet Take 1 tablet (20 mg total) each morning. Take additional dose (20 mg total) on Monday and Thursday afternoons Patient taking differently: Take 20 mg by mouth daily. 09/01/20  Yes Rise Mu, PA-C     Level 3 follow-up     Jahsir Rama

## 2021-02-14 NOTE — Progress Notes (Signed)
GI Inpatient Follow-up Note  Subjective:  Patient seen and still on low dose levophed. Denies any abdominal pain.  Scheduled Inpatient Medications:  . Chlorhexidine Gluconate Cloth  6 each Topical Daily  . heparin  5,000 Units Subcutaneous Q8H  . levothyroxine  12.5 mcg Intravenous Daily  . midodrine  10 mg Oral TID WC  . potassium chloride  20 mEq Oral Daily    Continuous Inpatient Infusions:   . sodium chloride 250 mL (01/27/2021 1618)  . lactated ringers 125 mL/hr at 02/14/21 1300  . norepinephrine (LEVOPHED) Adult infusion 2 mcg/min (02/14/21 1300)  . piperacillin-tazobactam (ZOSYN)  IV 3.375 g (02/14/21 1328)  . sodium chloride      PRN Inpatient Medications:  acetaminophen, oxyCODONE-acetaminophen  Review of Systems:  Review of Systems  Constitutional: Negative for chills and fever.  Respiratory: Negative for cough.   Cardiovascular: Negative for chest pain.  Gastrointestinal: Negative for abdominal pain, blood in stool, melena, nausea and vomiting.  Musculoskeletal: Negative for joint pain.  Skin: Negative for rash.  Neurological: Negative for focal weakness.  Psychiatric/Behavioral: Negative for substance abuse.  All other systems reviewed and are negative.    Physical Examination: BP 101/73   Pulse (!) 59   Temp 98.1 F (36.7 C) (Oral)   Resp 18   Ht 5' 6"  (1.676 m)   Wt 79 kg   SpO2 98%   BMI 28.11 kg/m  Gen: NAD HEENT: PEERLA Neck: supple Chest: No respiratory distress CV: RRR Abd: distended but soft Ext: trace edema Skin: no rash or lesions noted Lymph: no LAD  Data: Lab Results  Component Value Date   WBC 11.6 (H) 02/14/2021   HGB 7.2 (L) 02/14/2021   HCT 22.9 (L) 02/14/2021   MCV 95.0 02/14/2021   PLT 210 02/14/2021   Recent Labs  Lab 02/12/21 0527 02/13/21 0319 02/14/21 0419  HGB 7.5* 7.3* 7.2*   Lab Results  Component Value Date   NA 140 02/14/2021   K 4.4 02/14/2021   CL 116 (H) 02/14/2021   CO2 18 (L) 02/14/2021   BUN  46 (H) 02/14/2021   CREATININE 2.88 (H) 02/14/2021   Lab Results  Component Value Date   ALT 8 02/11/2021   AST 13 (L) 02/11/2021   ALKPHOS 43 02/11/2021   BILITOT 0.5 02/11/2021   No results for input(s): APTT, INR, PTT in the last 168 hours. Assessment/Plan: William Holland is a 75 y/o gentleman with sepsis secondary to UTI and chronic diarrhea for which we were consulted. Has history of SCAD. Patient diarrhea but stool studies were negative.  Recommendations:  - goals of care discussions pending - in terms of his diarrhea, mesalamine is an option as a treatment of diarrhea but has rare risk of making kidney function worse - another option is prednisone 40 mg daily if infection is felt to be adequately treated - could also consider stool bulking agent like psyllium - would avoid immodium and questran as this could make abdominal distention worse  Will follow peripherally for now. Please call if any questions or concerns.  Raylene Miyamoto MD, MPH [place]

## 2021-02-14 NOTE — Consult Note (Signed)
[facility] for Electrolyte Monitoring and Replacement   Recent Labs: Potassium (mmol/L)  Date Value  02/14/2021 4.4   Magnesium (mg/dL)  Date Value  02/14/2021 1.6 (L)   Calcium (mg/dL)  Date Value  02/14/2021 8.2 (L)   Albumin (g/dL)  Date Value  02/11/2021 2.1 (L)  06/29/2020 4.3   Phosphorus (mg/dL)  Date Value  02/14/2021 3.2   Sodium (mmol/L)  Date Value  02/14/2021 140  06/29/2020 143   Corrected Ca: 9.4 mg/dL  Assessment: Pt is a 75 yo presented to the ED on 2/15 due to a syncopal event with loss of consciousness. Suspected sepsis with septic shock due to suspected UTI in the setting of chronic cystitis. His SCr is stabilizinng  Pharmacy has been consulted to monitor and replenish electrolytes.   MIVF: lactated ringers at 125 mL/hr  Supplement: KLOR-CON 20 mEq PO daily  Goal of Therapy:  Electrolytes WNL  Plan:  - 4 grams IV magnesium sulfate x 1 - Will follow K, Mg, and Phos with am labs  Dallie Piles, PharmD, BCPS Clinical Pharmacist 02/14/2021 7:14 AM

## 2021-02-14 NOTE — Progress Notes (Signed)
Palliative: Mr. Thang, Flett, is lying quietly in bed.  He greets me making and mostly keeping eye contact.  He looks somewhat improved today, his color is better.  He is alert and oriented to person, place, situation.  He is able to make his basic needs known.  There is no family at bedside at this time.  We talked about Keenon's acute health problems and the treatment plan.  Pacer continues to have large amounts of liquid stool and his abdomen is still distended.  He continues on low-dose of norepinephrine for blood pressure support.  His stool studies were negative. GI consult recommends adding stool bulking agent like psyllium, offers options for further treatment.   Call to brother, Salathiel Ferrara.  We talk about the treatment plan, including, but not limited to Cystitis/UTI and colonization.  We talked about recurrent UTIs being expected.  We talked about CODE STATUS, "treat the treatable, but allowing natural death".  [name] endorses DNR stating that he would not want to want to endure broken ribs or being put on machines to be kept alive.  He states several times is worried that his brother would be even further diminished if he survived.  I reassured [name] that we will continue to treat the treatable.  Psychosocially, [name] shares that Sayvon has always been slow.  He did go through the 10th grade in special classes.  He has never worked.  He went away to learn some "life skills", but [name] shares he felt that he was no better upon return.  Speaking with Robert's wife, she shares that Kaipo often does not make the best food choices even when good choices are left out for him.  Declin used to live with [place] family, but moved in with Robert's son [name].  Mancel Bale wife states that Ivie is "not too good on hygiene".  She shares that they would take him back in their home, but there is not enough room.  She shares that he does not have 24/7 supervision.  [name] works second shift, and although [name] checks  on him, he is at home alone most of the day.  We talked about PT evaluation, short-term rehab versus home health depending on Vincenzo's abilities.  Patient and family state they would prefer for Emrick to return home.  If he is unable to stand and toilet, make himself a sandwich if needed, then he may need short-term rehab.  We talked about outpatient palliative services, continuing the discussions around "what if's and maybe's".  Family is agreeable to outpatient palliative care.  Conference with attending related to patient condition, needs, goals of care.  Plan: Now DNR, continue to treat the treatable.  Awaiting improvement for physical therapy evaluation.  Patient and family would prefer home with home health, although this remains to be seen.  Outpatient palliative to follow.  60 minutes  Quinn Axe, NP Palliative medicine team Team phone [phone] Greater than 50% of this time was spent counseling and coordinating care related to the above assessment and plan.

## 2021-02-14 NOTE — Progress Notes (Signed)
This RN spoke with patient concerning blood transfusion and obtaining consent. Patient said he did not want to receive blood but to call and speak with his brother William Holland [facility]). Robert refused the blood transfusion for patient stating they are Jehovah Witness. Domingo Pulse NP updated.

## 2021-02-14 NOTE — Progress Notes (Signed)
[facility]  ROUNDING NOTE   Subjective:   Hospital Course: admitted on 2/15 for near syncope  Having large amount of liquid stools Abdomen is still distended UOP is low   S Creatinine is lower today.  States he is able to eat without nausea or vomiting Requiring small dose of norepinephrine   Objective:  Vital signs in last 24 hours:  Temp:  [97.9 F (36.6 C)-98.6 F (37 C)] 98.3 F (36.8 C) (02/22 0730) Pulse Rate:  [48-94] 65 (02/22 0815) Resp:  [11-21] 17 (02/22 0815) BP: (90-147)/(35-98) 124/57 (02/22 0815) SpO2:  [96 %-100 %] 99 % (02/22 0815)  Weight change:  Filed Weights   02/17/2021 0912 02/11/21 0500 02/12/21 0611  Weight: 67 kg 73 kg 79 kg    Intake/Output: I/O last 3 completed shifts: In: 5256.8 [P.O.:240; I.V.:4644.2; IV Piggyback:372.6] Out: 3550 [Stool:3550]   Intake/Output this shift:  Total I/O In: 148.8 [I.V.:136.3; IV Piggyback:12.5] Out: -   Physical Exam: General: Ill appearing  Head: Normocephalic, atraumatic. Moist oral mucosal membranes  Lungs:  Clear to auscultation  Heart: Regular rate and rhythm  Abdomen:  Soft, nontender, +distended, tympanic  Extremities:  trace peripheral edema.  Neurologic: Nonfocal, moving all four extremities  Skin: No lesions  Access:   Rectal tube External urine collection system  Basic Metabolic Panel: Recent Labs  Lab 02/10/21 0611 02/10/21 2040 02/11/21 0624 02/11/21 2259 02/12/21 0527 02/13/21 0319 02/14/21 0419  NA 141  --  140  --  141 140 140  K 2.9*   < > 3.5 4.4 4.1 4.5 4.4  CL 105  --  107  --  113* 114* 116*  CO2 26  --  22  --  19* 19* 18*  GLUCOSE 120*  --  98  --  97 99 89  BUN 84*  --  72*  --  59* 53* 46*  CREATININE 3.58*  --  3.35*  --  2.94* 3.09* 2.88*  CALCIUM 7.6*  --  7.8*  --  8.0* 7.9* 8.2*  MG 2.1  --  2.0  --  1.8 1.8 1.6*  PHOS 2.7  --  2.9  --  2.9 3.0 3.2   < > = values in this interval not displayed.    Liver Function Tests: Recent Labs  Lab  02/11/21 0624  AST 13*  ALT 8  ALKPHOS 43  BILITOT 0.5  PROT 5.5*  ALBUMIN 2.1*   No results for input(s): LIPASE, AMYLASE in the last 168 hours. No results for input(s): AMMONIA in the last 168 hours.  CBC: Recent Labs  Lab 02/10/21 0611 02/11/21 0624 02/12/21 0527 02/13/21 0319 02/14/21 0419  WBC 8.9 10.0 11.4* 11.0* 11.6*  HGB 7.4* 7.9* 7.5* 7.3* 7.2*  HCT 22.6* 24.5* 23.9* 23.8* 22.9*  MCV 90.8 92.1 93.4 96.4 95.0  PLT 201 210 213 211 210    Cardiac Enzymes: No results for input(s): CKTOTAL, CKMB, CKMBINDEX, TROPONINI in the last 168 hours.  BNP: Invalid input(s): POCBNP  CBG: Recent Labs  Lab 02/08/21 0628  GLUCAP 102*    Microbiology: Results for orders placed or performed during the hospital encounter of 02/13/2021  Culture, blood (routine x 2)     Status: None   Collection Time: 02/15/2021 10:29 AM   Specimen: BLOOD  Result Value Ref Range Status   Specimen Description BLOOD LEFT ANTECUBITAL  Final   Special Requests   Final    BOTTLES DRAWN AEROBIC AND ANAEROBIC Blood Culture results may not  be optimal due to an excessive volume of blood received in culture bottles   Culture   Final    NO GROWTH 5 DAYS Performed at [facility], [address] [place], [place] [address]    Report Status 02/12/2021 FINAL  Final  Culture, blood (routine x 2)     Status: None   Collection Time: 01/29/2021 10:30 AM   Specimen: BLOOD  Result Value Ref Range Status   Specimen Description BLOOD BLOOD LEFT FOREARM  Final   Special Requests   Final    BOTTLES DRAWN AEROBIC ONLY Blood Culture adequate volume   Culture   Final    NO GROWTH 5 DAYS Performed at [facility], [address] [place], [place] [address]    Report Status 02/12/2021 FINAL  Final  SARS CORONAVIRUS 2 (TAT 6-24 HRS) Nasopharyngeal Nasopharyngeal Swab     Status: None   Collection Time: 02/20/2021 12:09 PM   Specimen: Nasopharyngeal Swab  Result Value Ref Range Status   SARS  Coronavirus 2 NEGATIVE NEGATIVE Final    Comment: (NOTE) SARS-CoV-2 target nucleic acids are NOT DETECTED.  The SARS-CoV-2 RNA is generally detectable in upper and lower respiratory specimens during the acute phase of infection. Negative results do not preclude SARS-CoV-2 infection, do not rule out co-infections with other pathogens, and should not be used as the sole basis for treatment or other patient management decisions. Negative results must be combined with clinical observations, patient history, and epidemiological information. The expected result is Negative.  Fact Sheet for Patients: SugarRoll.be  Fact Sheet for Healthcare Providers: https://www.woods-mathews.com/  This test is not yet approved or cleared by the [place] FDA and  has been authorized for detection and/or diagnosis of SARS-CoV-2 by FDA under an Emergency Use Authorization (EUA). This EUA will remain  in effect (meaning this test can be used) for the duration of the COVID-19 declaration under Se ction 564(b)(1) of the Act, 21 U.S.C. section 360bbb-3(b)(1), unless the authorization is terminated or revoked sooner.  Performed at [facility], [place] [address], [place], [place] [address]   Urine Culture     Status: Abnormal   Collection Time: 02/16/2021  4:16 PM   Specimen: Urine, Random  Result Value Ref Range Status   Specimen Description   Final    URINE, RANDOM Performed at [facility], [address] [place], [place] [address]    Special Requests   Final    NONE Performed at [facility], [address] [place], [place] [address]    Culture (A)  Final    >=100,000 COLONIES/mL MULTIPLE SPECIES PRESENT, SUGGEST RECOLLECTION   Report Status 02/09/2021 FINAL  Final  MRSA PCR Screening     Status: None   Collection Time: 02/08/21  6:30 AM   Specimen: Nasopharyngeal  Result Value Ref Range Status   MRSA by PCR NEGATIVE NEGATIVE  Final    Comment:        The GeneXpert MRSA Assay (FDA approved for NASAL specimens only), is one component of a comprehensive MRSA colonization surveillance program. It is not intended to diagnose MRSA infection nor to guide or monitor treatment for MRSA infections. Performed at [facility], [address] [place],  [address]   Gastrointestinal Panel by PCR , Stool     Status: None   Collection Time: 02/10/21 12:36 PM   Specimen: Stool  Result Value Ref Range Status   Campylobacter species NOT DETECTED NOT DETECTED Final  Plesimonas shigelloides NOT DETECTED NOT DETECTED Final   Salmonella species NOT DETECTED NOT DETECTED Final   Yersinia enterocolitica NOT DETECTED NOT DETECTED Final   Vibrio species NOT DETECTED NOT DETECTED Final   Vibrio cholerae NOT DETECTED NOT DETECTED Final   Enteroaggregative E coli (EAEC) NOT DETECTED NOT DETECTED Final   Enteropathogenic E coli (EPEC) NOT DETECTED NOT DETECTED Final   Enterotoxigenic E coli (ETEC) NOT DETECTED NOT DETECTED Final   Shiga like toxin producing E coli (STEC) NOT DETECTED NOT DETECTED Final   Shigella/Enteroinvasive E coli (EIEC) NOT DETECTED NOT DETECTED Final   Cryptosporidium NOT DETECTED NOT DETECTED Final   Cyclospora cayetanensis NOT DETECTED NOT DETECTED Final   Entamoeba histolytica NOT DETECTED NOT DETECTED Final   Giardia lamblia NOT DETECTED NOT DETECTED Final   Adenovirus F40/41 NOT DETECTED NOT DETECTED Final   Astrovirus NOT DETECTED NOT DETECTED Final   Norovirus GI/GII NOT DETECTED NOT DETECTED Final   Rotavirus A NOT DETECTED NOT DETECTED Final   Sapovirus (I, II, IV, and V) NOT DETECTED NOT DETECTED Final    Comment: Performed at [facility], [address] [place], [place] [address]  C Difficile Quick Screen (NO PCR Reflex)     Status: None   Collection Time: 02/10/21 12:36 PM   Specimen: STOOL  Result Value Ref Range Status   C Diff antigen NEGATIVE NEGATIVE Final    C Diff toxin NEGATIVE NEGATIVE Final   C Diff interpretation No C. difficile detected.  Final    Comment: Performed at [facility], [address] [place], [place] [address]    Coagulation Studies: No results for input(s): LABPROT, INR in the last 72 hours.  Urinalysis: No results for input(s): COLORURINE, LABSPEC, PHURINE, GLUCOSEU, HGBUR, BILIRUBINUR, KETONESUR, PROTEINUR, UROBILINOGEN, NITRITE, LEUKOCYTESUR in the last 72 hours.  Invalid input(s): APPERANCEUR    Imaging: DG Abd 1 View  Result Date: 02/13/2021 CLINICAL DATA:  Abdominal distension. EXAM: ABDOMEN - 1 VIEW COMPARISON:  July 18, 2018 FINDINGS: Distended air-filled loops of large bowel are again seen. This is unchanged in appearance when compared to the prior study. Multiple stable calcifications are seen overlying the expected region of the urinary bladder. IMPRESSION: 1. Persistently distended air-filled large bowel, unchanged in appearance when compared to the prior study. While this may be, in part, chronic in nature, sequelae associated with a distal colonic obstruction cannot be excluded. 2. Urinary bladder calculi. Electronically Signed   By: Virgina Norfolk M.D.   On: 02/13/2021 20:04     Medications:   . sodium chloride 250 mL (02/04/2021 1618)  . lactated ringers 125 mL/hr at 02/14/21 0800  . magnesium sulfate bolus IVPB    . norepinephrine (LEVOPHED) Adult infusion 3 mcg/min (02/14/21 0800)  . piperacillin-tazobactam (ZOSYN)  IV 3.375 g (02/14/21 0552)  . sodium chloride     . Chlorhexidine Gluconate Cloth  6 each Topical Daily  . heparin  5,000 Units Subcutaneous Q8H  . levothyroxine  12.5 mcg Intravenous Daily  . midodrine  10 mg Oral TID WC  . potassium chloride  20 mEq Oral Daily   acetaminophen, oxyCODONE-acetaminophen  Assessment/ Plan:  [name] is a 75 y.o. white male with hypertension, coronary artery disease, congestive heart failure, history of bowel perforation,  who was  admitted to [facility] on 02/17/2021 for Cystitis [N30.90]  1. Acute kidney injury with chronic kidney disease stage IV with proteinuria and hematuria:  baseline creatinine of 2.57, GFR of 24 on 09/05/2020.  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  CT abdomen 2/15- chronic cystitis with moderate bilateral hydronephrosis with bladder calculi -chronic. Appreciate urology input.   02/21 0701 - 02/22 0700 In: 3511.4 [P.O.:240; I.V.:2998.9; IV Piggyback:272.6] Out: 1950 [Stool:1950]   Lab Results  Component Value Date   CREATININE 2.88 (H) 02/14/2021   CREATININE 3.09 (H) 02/13/2021   CREATININE 2.94 (H) 02/12/2021   S Creatinine trend appears to be stabilizing with slight up tick noted Electrolytes and Volume status are acceptable No acute indication for Dialysis at present  Continue supportive care and maintain fluids    2. Hypotension: requiring vasopressors: norepinephrine.  Urine culture with mutliple species present on 2/15.  - empiric antibiotics: pip/tazo   3. Proteinuria UPC 3.68 K/l ratio 1.68  ANA pending  4. Chronic diarrhea GI eval ongoing Segmental colitis associated with diverticulosis (SCAD) H/o colonoscopy 02/2018 C Diff neg     LOS: 7 Maizy Davanzo 2/22/20228:49 AM

## 2021-02-14 NOTE — Progress Notes (Signed)
Per Dr. Merrilee Jansky goal MAP is 60-65 to titrate levophed drip.

## 2021-02-15 DIAGNOSIS — Z7189 Other specified counseling: Secondary | ICD-10-CM | POA: Diagnosis not present

## 2021-02-15 DIAGNOSIS — N170 Acute kidney failure with tubular necrosis: Secondary | ICD-10-CM | POA: Diagnosis not present

## 2021-02-15 DIAGNOSIS — A419 Sepsis, unspecified organism: Secondary | ICD-10-CM | POA: Diagnosis not present

## 2021-02-15 DIAGNOSIS — Z515 Encounter for palliative care: Secondary | ICD-10-CM | POA: Diagnosis not present

## 2021-02-15 DIAGNOSIS — R6521 Severe sepsis with septic shock: Secondary | ICD-10-CM | POA: Diagnosis not present

## 2021-02-15 LAB — CBC
HCT: 23.5 % — ABNORMAL LOW (ref 39.0–52.0)
Hemoglobin: 7.1 g/dL — ABNORMAL LOW (ref 13.0–17.0)
MCH: 29.5 pg (ref 26.0–34.0)
MCHC: 30.2 g/dL (ref 30.0–36.0)
MCV: 97.5 fL (ref 80.0–100.0)
Platelets: 203 10*3/uL (ref 150–400)
RBC: 2.41 MIL/uL — ABNORMAL LOW (ref 4.22–5.81)
RDW: 15 % (ref 11.5–15.5)
WBC: 8.9 10*3/uL (ref 4.0–10.5)
nRBC: 0 % (ref 0.0–0.2)

## 2021-02-15 LAB — BASIC METABOLIC PANEL
Anion gap: 5 (ref 5–15)
BUN: 45 mg/dL — ABNORMAL HIGH (ref 8–23)
CO2: 19 mmol/L — ABNORMAL LOW (ref 22–32)
Calcium: 8.2 mg/dL — ABNORMAL LOW (ref 8.9–10.3)
Chloride: 116 mmol/L — ABNORMAL HIGH (ref 98–111)
Creatinine, Ser: 2.96 mg/dL — ABNORMAL HIGH (ref 0.61–1.24)
GFR, Estimated: 21 mL/min — ABNORMAL LOW (ref >60)
Glucose, Bld: 89 mg/dL (ref 70–99)
Potassium: 4.9 mmol/L (ref 3.5–5.1)
Sodium: 140 mmol/L (ref 135–145)

## 2021-02-15 LAB — PHOSPHORUS: Phosphorus: 3.4 mg/dL (ref 2.5–4.6)

## 2021-02-15 LAB — MAGNESIUM: Magnesium: 2.5 mg/dL — ABNORMAL HIGH (ref 1.7–2.4)

## 2021-02-15 MED ORDER — ALBUMIN HUMAN 5 % IV SOLN
25.0000 g | Freq: Once | INTRAVENOUS | Status: AC
  Administered 2021-02-15: 25 g via INTRAVENOUS
  Filled 2021-02-15: qty 500

## 2021-02-15 NOTE — Progress Notes (Signed)
Palliative: [name], William Holland, is lying quietly in bed. His brother/healthcare power of attorney, William Holland, is at bedside. William Holland greets me making and somewhat keeping eye contact. He is calm and cooperative, but does not say much. He has developmental delays, is described as "slow" by his brother William Holland. William Holland is able to make his basic needs known.  Brain Hilts and I talked about William Holland's acute illness, his urosepsis. We talked about the use of vasopressors and blood pressure. I shared that until William Holland can maintain blood pressure without support, he will stay in the intensive care.  We talked about the recurrent nature of UTI in men. I encourage family to be watchful for recurrent UTIs. We also talked about chronic loose stool and the treatment plan.  I shared that once William Holland is able, physical therapy will evaluate him for short-term rehab versus home with home health. I encouraged him to be open and accepting of what is next. I share that William Holland can continue to see him if he goes to short-term rehab. He tells me that he will be open to going to short-term if needed.  Plan: Continue to treat the treatable but no CPR or intubation. Awaiting PT eval and recommendations. Potentially open to short-term rehab if qualified. Agreeable to outpatient palliative services. ACP updated.       [name] is not a practicing Jehovah's Witness, but his brother/HCPOA, William Holland is.  William Holland and William Holland decline blood transfusions.  86 minutes  Quinn Axe, NP Palliative medicine team Team phone [phone] Greater than 50% of this time was spent counseling and coordinating care related to the above assessment and plan.

## 2021-02-15 NOTE — Consult Note (Signed)
[facility] for Electrolyte Monitoring and Replacement   Recent Labs: Potassium (mmol/L)  Date Value  02/15/2021 4.9   Magnesium (mg/dL)  Date Value  02/15/2021 2.5 (H)   Calcium (mg/dL)  Date Value  02/15/2021 8.2 (L)   Albumin (g/dL)  Date Value  02/11/2021 2.1 (L)  06/29/2020 4.3   Phosphorus (mg/dL)  Date Value  02/15/2021 3.4   Sodium (mmol/L)  Date Value  02/15/2021 140  06/29/2020 143   Corrected Ca: 9.4 mg/dL  Assessment: Pt is a 75 yo presented to the ED on 2/15 due to a syncopal event with loss of consciousness. Suspected sepsis with septic shock due to suspected UTI in the setting of chronic cystitis. His SCr is stabilizinng  Pharmacy has been consulted to monitor and replenish electrolytes.   MIVF: lactated ringers at 125 mL/hr  Supplement: KLOR-CON 20 mEq PO daily  Goal of Therapy:  Electrolytes WNL  Plan:   no electrolyte replacement warranted for today  Stop daily oral KCl  Will follow K, Mg, and Phos with am labs  Dallie Piles, PharmD, BCPS Clinical Pharmacist 02/15/2021 9:47 AM

## 2021-02-15 NOTE — Progress Notes (Signed)
[name]  ROUNDING NOTE   Subjective:   Hospital Course: admitted on 2/15 for near syncope  Having large amount of liquid stools Abdomen is still distended UOP is low   S Creatinine is lower today.  States he is able to eat without nausea or vomiting Requiring small dose of norepinephrine   Objective:  Vital signs in last 24 hours:  Temp:  [98.1 F (36.7 C)-98.6 F (37 C)] 98.4 F (36.9 C) (02/23 0400) Pulse Rate:  [44-91] 53 (02/22 2000) Resp:  [12-21] 18 (02/22 2000) BP: (92-131)/(36-83) 112/42 (02/22 2000) SpO2:  [96 %-100 %] 98 % (02/22 2000)  Weight change:  Filed Weights   01/28/2021 0912 02/11/21 0500 02/12/21 0611  Weight: 67 kg 73 kg 79 kg    Intake/Output: I/O last 3 completed shifts: In: 6659 [P.O.:240; I.V.:3196.6; IV Piggyback:278.4] Out: 2500 [Stool:2500]   Intake/Output this shift:  No intake/output data recorded.  Physical Exam: General: Ill appearing  Head: Normocephalic, atraumatic. Moist oral mucosal membranes  Lungs:  Clear to auscultation  Heart: Regular rate and rhythm  Abdomen:  Soft, nontender, +distended, tympanic  Extremities:  trace peripheral edema.  Neurologic: Nonfocal, moving all four extremities  Skin: No lesions  Access:   Rectal tube External urine collection system  Basic Metabolic Panel: Recent Labs  Lab 02/11/21 0624 02/11/21 2259 02/12/21 0527 02/13/21 0319 02/14/21 0419 02/15/21 0651  NA 140  --  141 140 140 140  K 3.5 4.4 4.1 4.5 4.4 4.9  CL 107  --  113* 114* 116* 116*  CO2 22  --  19* 19* 18* 19*  GLUCOSE 98  --  97 99 89 89  BUN 72*  --  59* 53* 46* 45*  CREATININE 3.35*  --  2.94* 3.09* 2.88* 2.96*  CALCIUM 7.8*  --  8.0* 7.9* 8.2* 8.2*  MG 2.0  --  1.8 1.8 1.6* 2.5*  PHOS 2.9  --  2.9 3.0 3.2 3.4    Liver Function Tests: Recent Labs  Lab 02/11/21 0624  AST 13*  ALT 8  ALKPHOS 43  BILITOT 0.5  PROT 5.5*  ALBUMIN 2.1*   No results for input(s): LIPASE, AMYLASE in the last 168  hours. No results for input(s): AMMONIA in the last 168 hours.  CBC: Recent Labs  Lab 02/11/21 0624 02/12/21 0527 02/13/21 0319 02/14/21 0419 02/15/21 0651  WBC 10.0 11.4* 11.0* 11.6* 8.9  HGB 7.9* 7.5* 7.3* 7.2* 7.1*  HCT 24.5* 23.9* 23.8* 22.9* 23.5*  MCV 92.1 93.4 96.4 95.0 97.5  PLT 210 213 211 210 203    Cardiac Enzymes: No results for input(s): CKTOTAL, CKMB, CKMBINDEX, TROPONINI in the last 168 hours.  BNP: Invalid input(s): POCBNP  CBG: No results for input(s): GLUCAP in the last 168 hours.  Microbiology: Results for orders placed or performed during the hospital encounter of 02/20/2021  Culture, blood (routine x 2)     Status: None   Collection Time: 01/25/2021 10:29 AM   Specimen: BLOOD  Result Value Ref Range Status   Specimen Description BLOOD LEFT ANTECUBITAL  Final   Special Requests   Final    BOTTLES DRAWN AEROBIC AND ANAEROBIC Blood Culture results may not be optimal due to an excessive volume of blood received in culture bottles   Culture   Final    NO GROWTH 5 DAYS Performed at [facility], [address] [place], [place] [address]    Report Status 02/12/2021 FINAL  Final  Culture, blood (routine  x 2)     Status: None   Collection Time: 01/31/2021 10:30 AM   Specimen: BLOOD  Result Value Ref Range Status   Specimen Description BLOOD BLOOD LEFT FOREARM  Final   Special Requests   Final    BOTTLES DRAWN AEROBIC ONLY Blood Culture adequate volume   Culture   Final    NO GROWTH 5 DAYS Performed at [facility], [address], [place], [place] [address]    Report Status 02/12/2021 FINAL  Final  SARS CORONAVIRUS 2 (TAT 6-24 HRS) Nasopharyngeal Nasopharyngeal Swab     Status: None   Collection Time: 02/10/2021 12:09 PM   Specimen: Nasopharyngeal Swab  Result Value Ref Range Status   SARS Coronavirus 2 NEGATIVE NEGATIVE Final    Comment: (NOTE) SARS-CoV-2 target nucleic acids are NOT DETECTED.  The SARS-CoV-2 RNA is generally  detectable in upper and lower respiratory specimens during the acute phase of infection. Negative results do not preclude SARS-CoV-2 infection, do not rule out co-infections with other pathogens, and should not be used as the sole basis for treatment or other patient management decisions. Negative results must be combined with clinical observations, patient history, and epidemiological information. The expected result is Negative.  Fact Sheet for Patients: SugarRoll.be  Fact Sheet for Healthcare Providers: https://www.woods-mathews.com/  This test is not yet approved or cleared by the [place] FDA and  has been authorized for detection and/or diagnosis of SARS-CoV-2 by FDA under an Emergency Use Authorization (EUA). This EUA will remain  in effect (meaning this test can be used) for the duration of the COVID-19 declaration under Se ction 564(b)(1) of the Act, 21 U.S.C. section 360bbb-3(b)(1), unless the authorization is terminated or revoked sooner.  Performed at [facility], [place] [address] [place], [place] [address]   Urine Culture     Status: Abnormal   Collection Time: 02/11/2021  4:16 PM   Specimen: Urine, Random  Result Value Ref Range Status   Specimen Description   Final    URINE, RANDOM Performed at [facility], [address], [place], [place] [address]    Special Requests   Final    NONE Performed at [facility], [address] [place], [place] [address]    Culture (A)  Final    >=100,000 COLONIES/mL MULTIPLE SPECIES PRESENT, SUGGEST RECOLLECTION   Report Status 02/09/2021 FINAL  Final  MRSA PCR Screening     Status: None   Collection Time: 02/08/21  6:30 AM   Specimen: Nasopharyngeal  Result Value Ref Range Status   MRSA by PCR NEGATIVE NEGATIVE Final    Comment:        The GeneXpert MRSA Assay (FDA approved for NASAL specimens only), is one component of a comprehensive MRSA  colonization surveillance program. It is not intended to diagnose MRSA infection nor to guide or monitor treatment for MRSA infections. Performed at [facility], [address] [place], [place] [address]   Gastrointestinal Panel by PCR , Stool     Status: None   Collection Time: 02/10/21 12:36 PM   Specimen: Stool  Result Value Ref Range Status   Campylobacter species NOT DETECTED NOT DETECTED Final   Plesimonas shigelloides NOT DETECTED NOT DETECTED Final   Salmonella species NOT DETECTED NOT DETECTED Final   Yersinia enterocolitica NOT DETECTED NOT DETECTED Final   Vibrio species NOT DETECTED NOT DETECTED Final   Vibrio cholerae NOT DETECTED NOT DETECTED Final   Enteroaggregative E coli (EAEC) NOT  DETECTED NOT DETECTED Final   Enteropathogenic E coli (EPEC) NOT DETECTED NOT DETECTED Final   Enterotoxigenic E coli (ETEC) NOT DETECTED NOT DETECTED Final   Shiga like toxin producing E coli (STEC) NOT DETECTED NOT DETECTED Final   Shigella/Enteroinvasive E coli (EIEC) NOT DETECTED NOT DETECTED Final   Cryptosporidium NOT DETECTED NOT DETECTED Final   Cyclospora cayetanensis NOT DETECTED NOT DETECTED Final   Entamoeba histolytica NOT DETECTED NOT DETECTED Final   Giardia lamblia NOT DETECTED NOT DETECTED Final   Adenovirus F40/41 NOT DETECTED NOT DETECTED Final   Astrovirus NOT DETECTED NOT DETECTED Final   Norovirus GI/GII NOT DETECTED NOT DETECTED Final   Rotavirus A NOT DETECTED NOT DETECTED Final   Sapovirus (I, II, IV, and V) NOT DETECTED NOT DETECTED Final    Comment: Performed at [facility], [address] [place], [place] [address]  C Difficile Quick Screen (NO PCR Reflex)     Status: None   Collection Time: 02/10/21 12:36 PM   Specimen: STOOL  Result Value Ref Range Status   C Diff antigen NEGATIVE NEGATIVE Final   C Diff toxin NEGATIVE NEGATIVE Final   C Diff interpretation No C. difficile detected.  Final    Comment: Performed at [facility], [address] [place], [place] [address]    Coagulation Studies: No results for input(s): LABPROT, INR in the last 72 hours.  Urinalysis: No results for input(s): COLORURINE, LABSPEC, PHURINE, GLUCOSEU, HGBUR, BILIRUBINUR, KETONESUR, PROTEINUR, UROBILINOGEN, NITRITE, LEUKOCYTESUR in the last 72 hours.  Invalid input(s): APPERANCEUR    Imaging: DG Abd 1 View  Result Date: 02/13/2021 CLINICAL DATA:  Abdominal distension. EXAM: ABDOMEN - 1 VIEW COMPARISON:  July 18, 2018 FINDINGS: Distended air-filled loops of large bowel are again seen. This is unchanged in appearance when compared to the prior study. Multiple stable calcifications are seen overlying the expected region of the urinary bladder. IMPRESSION: 1. Persistently distended air-filled large bowel, unchanged in appearance when compared to the prior study. While this may be, in part, chronic in nature, sequelae associated with a distal colonic obstruction cannot be excluded. 2. Urinary bladder calculi. Electronically Signed   By: Virgina Norfolk M.D.   On: 02/13/2021 20:04     Medications:   . sodium chloride 250 mL (02/06/2021 1618)  . lactated ringers 125 mL/hr at 02/14/21 1900  . norepinephrine (LEVOPHED) Adult infusion 2 mcg/min (02/14/21 1900)  . piperacillin-tazobactam (ZOSYN)  IV 3.375 g (02/15/21 0532)  . sodium chloride     . sodium chloride   Intravenous Once  . Chlorhexidine Gluconate Cloth  6 each Topical Daily  . heparin  5,000 Units Subcutaneous Q8H  . levothyroxine  12.5 mcg Intravenous Daily  . midodrine  10 mg Oral TID WC  . potassium chloride  20 mEq Oral Daily  . psyllium  1 packet Oral Daily   acetaminophen, oxyCODONE-acetaminophen  Assessment/ Plan:  [name] is a 75 y.o. white male with hypertension, coronary artery disease, congestive heart failure, history of bowel perforation,  who was admitted to [facility] on 02/10/2021 for Cystitis [N30.90]  1. Acute kidney injury with chronic  kidney disease stage IV with proteinuria and hematuria:  baseline creatinine of 2.57, GFR of 24 on 09/05/2020.  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  CT abdomen 2/15- chronic cystitis with moderate bilateral hydronephrosis with bladder calculi -chronic. Appreciate urology input.   02/22 0701 - 02/23 0700 In: 1726.9 [I.V.:1557.9; IV Piggyback:169] Out: 1500 [Stool:1500]  Lab Results  Component Value Date   CREATININE 2.96 (H) 02/15/2021   CREATININE 2.88 (H) 02/14/2021   CREATININE 3.09 (H) 02/13/2021   S Creatinine trend appears to be stabilizing with slight up tick noted Electrolytes and Volume status are acceptable No acute indication for Dialysis at present  Continue supportive care and maintain fluids    2. Hypotension: requiring vasopressors: norepinephrine.  Urine culture with mutliple species present on 2/15.  - empiric antibiotics: pip/tazo   3. Proteinuria UPC 3.68 K/l ratio 1.68  ANA negative  4. Chronic diarrhea GI eval ongoing Segmental colitis associated with diverticulosis (SCAD) H/o colonoscopy 02/2018 C Diff neg     LOS: 8 Harmeet Singh 2/23/20229:24 AM

## 2021-02-15 NOTE — Progress Notes (Signed)
NAME:  William Holland, MRN:  [id number], DOB:  1946/03/16, LOS: 8 ADMISSION DATE:  01/27/2021, INITIAL CONSULTATION DATE:  02/17/2021 REFERRING MD:  Dr. Cherylann Banas, CHIEF COMPLAINT: Abdominal Pain & Syncope  Brief History:  75 yo male presenting to the ED in septic shock due to UTI requiring low dose pressors, admitted to ICU.  History of Present Illness:  75 yo male presented to the ED after a syncopal event with loss of consciousness. The patient's brother reported the patient was sitting, getting his hair cut when he fell over and became unresponsive for about 15 minutes. The patient denied experiencing any symptoms prior to falling over, no dizziness/lightheadness/prodromal symptoms. Only other complaint is mild pain to light palpation of abdomen which is chronically distended. Patient denies fever/chills/cough, does admit to diarrhea but states this is chronic. Patient denies that anything like this has ever happened to him before.   ED course: Initial vitals: hypothermic- 95.4/ NSR- 96/ RR- 13/ hypotensive- 71/52 (60). Labs significant for: leukocytosis-WBC 25.1, hyperkalemic 5.6, Cl 115, serum CO2 14, BUN/Cr 110/4.92, BNP 128.4, troponin 18~20, lactic 1.1, PCT 1.87  Past Medical History:  CAD NICM Diverticulosis Chronic combined systolic & diastolic HF  Significant Hospital Events:  02/03/2021 Admission with suspected UTI, low dose levophed 2/16: Renal US with moderate bilateral hydronephrosis, multiple large bladder calculi 2/17: Urology consulted;recommedns antibiotics and follow up cultures; recommends against aggressive interventions including ureteral stents or nephrostomy tubes 2/18: Weaning Levophed, currently at 3 mcg  Consults:  Nephrology Urology  Procedures:  N/A  Significant Diagnostic Tests:  02/16/2021 CT Abdomen pelvis >>  Findings of chronic cystitis with interval development of moderate bilateral hydronephrosis. There are a few small foci of air within the left renal  pelvis. Findings raise suspicion for ascending urinary tract infection. Correlate with urinalysis. Persistent severe diffuse colonic dilatation with transition point again noted within the mid sigmoid colon. Circumferentially thickened 6 cm segment of mid sigmoid colon containing numerous diverticula. Similar degree of mild adjacent fat stranding. Findings may represent stricture related to chronic diverticulitis. Underlying colonic neoplasm not excluded. Loss of fat plane between the bladder dome and thickened sigmoid colon raises the possibility of a colovesical fistula. 01/27/2021: CT Head>>1. No acute intracranial abnormality. 2/16: Renal US>>Moderate bilateral hydronephrosis is noted. Continued presence of multiple large bladder calculi.  Micro Data:  02/14/2021 COVID 19 >>negative 02/10/2021 BC x 2 >>no growth to date 2/15: Urine>>  >=100,000 COLONIES/mL MULTIPLE SPECIES PRESENT, SUGGEST RECOLLECTIONAbnormal  2/16: MRSA PCR>>negative 2/18: GI panel>> 2/18: C.diff>>  Antimicrobials:  Cefepime 2/15 >>2/17 Flagyl 2/15 >>2/17 Vancomycin x 1 dose 2/15 Zosyn 2/17>>  Interval History:  -Continues with liquid stools -Pt is awake and alert -Hemodynamically stable, afebrile -Weaning down Levophed (currenlty at 3 mcg); starting Midodrine -Bicarb gtt being discontinued due to metabolic alkalosis -Pt noted to have large volume of diarrhea in rectal tube;  GI panel and C. Diff negative  Objective   Blood pressure (!) 114/53, pulse 63, temperature 98.8 F (37.1 C), temperature source Axillary, resp. rate 13, height 5' 6"  (1.676 m), weight 79 kg, SpO2 98 %.        Intake/Output Summary (Last 24 hours) at 02/15/2021 1838 Last data filed at 02/15/2021 1436 Gross per 24 hour  Intake 608.35 ml  Output 800 ml  Net -191.65 ml   Filed Weights   02/09/2021 0912 02/11/21 0500 02/12/21 0611  Weight: 67 kg 73 kg 79 kg    Examination: General: Acute on chronically ill appearing male,n NAD HEENT:  Atraumatic, normocephalic, neck supple, no JVD Pulm: Clear to auscultation bilaterally, even, nonlabored, normal effort CV: Regular rate and rhythm, s1s2, no M/R/G, 2+ distal pulses GI: Protuberant, no tenderness, no guarding or rebound tenderness, BS+ Skin: Warm and dry.  No obvious rashes, lesions, or ulcerations Extremities: No deformities, trace edema bilateral LE Neuro: Alert, oriented to person, place, and situation.  Follows commands, no focal deficits, speech clear, pupils PERRL Resolved Hospital Problem list   N/A  Assessment & Plan:  RESP: Stable CXR wihtout lobar infiltrate Room air  ID: Septic Shock in the setting of UTI HFrEF- LVEF 30-35% Hx: NICM, CAD -Remains low dose of levophed -Maintain MAP 60-65 -Continuous cardiac monitoring -Gentle IV fluids -Started midodrine, BP seems to be holding well over 60 -Home medications on hold in the setting of hypotension & AKI on CKD: metoprolol, losartan, torsemide. - TSH 9.773, trend from weekend -Cortisol 11  Severe Sepsis Due to suspected UTI in the setting of chronic cystitis  Lactic: 1.1, Baseline PCT: 1.87, UA: had large leukocytes, multi-flora CXR: chronic without acute disease, CT Abdomen: concern for UTI Cefepime/ Vancomycin/ Metronidazole -Monitor fever curve -Trend mild leukocytosis and procalcitonin -Follow cultures as above -Continue IV Zosyn pending cultures and sensitiviites  RENAL: Lab Results  Component Value Date   CREATININE 2.96 (H) 02/15/2021   BUN 45 (H) 02/15/2021   NA 140 02/15/2021   K 4.9 02/15/2021   CL 116 (H) 02/15/2021   CO2 19 (L) 02/15/2021    Intake/Output Summary (Last 24 hours) at 02/15/2021 1838 Last data filed at 02/15/2021 1436 Gross per 24 hour  Intake 608.35 ml  Output 800 ml  Net -191.65 ml    Net IO Since Admission: 11,913.99 mL [02/15/21 1838]  Acute Kidney Injury on CKD Stage IV (In the setting of shock. Baseline creatinine 2.36-2.57, creatinine on admission  4.92) Bilateral Hydronephrosis Hypokalemia Metabolic Acidosis>>resolved Metabolic Alkalosis -Monitor I&O's / urinary output -Follow BMP -Ensure adequate renal perfusion -Avoid nephrotoxic agents as able -Replace electrolytes as indicated -Nephrology following, appreciate input -Bicarb gtt d/c by Nephrology due to metabolic alkalosis -Starting NS w/ KCL -Urology consulted, did not recommend any aggressive intervention with ureteral stents or nephrostomy tubes  GI: SCAD Appreciate GI eval recommendations Try psyllium and prednisone Chronic component of current issue likely largest problem KUB   HEME: Anemia -Monitor for S/Sx of bleeding -Trend CBC -Heparin SQ for VTE Prophylaxis  -[employer] Witness Will accept albumin      Best practice (evaluated daily)  Diet: Heart healthy Pain/Anxiety/Delirium protocol (if indicated): N/a VAP protocol (if indicated): n/a DVT prophylaxis: heparin SQ GI prophylaxis: N/a Glucose control: monitor as needed Mobility: bedrest, mobilize as tolerated Disposition:ICU  Goals of Care:  Last date of multidisciplinary goals of care discussion:02/10/21 Family and staff present: Pt and bedside RN; no family available Summary of discussion: plan of care including antibiotics & vasopressors Follow up goals of care discussion due: 02/11/21 Code Status: FULL  Labs   CBC: Recent Labs  Lab 02/11/21 0624 02/12/21 0527 02/13/21 0319 02/14/21 0419 02/15/21 0651  WBC 10.0 11.4* 11.0* 11.6* 8.9  HGB 7.9* 7.5* 7.3* 7.2* 7.1*  HCT 24.5* 23.9* 23.8* 22.9* 23.5*  MCV 92.1 93.4 96.4 95.0 97.5  PLT 210 213 211 210 163    Basic Metabolic Panel: Recent Labs  Lab 02/11/21 0624 02/11/21 2259 02/12/21 0527 02/13/21 0319 02/14/21 0419 02/15/21 0651  NA 140  --  141 140 140 140  K 3.5 4.4 4.1 4.5 4.4 4.9  CL  107  --  113* 114* 116* 116*  CO2 22  --  19* 19* 18* 19*  GLUCOSE 98  --  97 99 89 89  BUN 72*  --  59* 53* 46* 45*  CREATININE 3.35*   --  2.94* 3.09* 2.88* 2.96*  CALCIUM 7.8*  --  8.0* 7.9* 8.2* 8.2*  MG 2.0  --  1.8 1.8 1.6* 2.5*  PHOS 2.9  --  2.9 3.0 3.2 3.4   GFR: Estimated Creatinine Clearance: 21.6 mL/min (A) (by C-G formula based on SCr of 2.96 mg/dL (H)). Recent Labs  Lab 02/12/21 0527 02/13/21 0319 02/14/21 0419 02/15/21 0651  WBC 11.4* 11.0* 11.6* 8.9    Liver Function Tests: Recent Labs  Lab 02/11/21 0624  AST 13*  ALT 8  ALKPHOS 43  BILITOT 0.5  PROT 5.5*  ALBUMIN 2.1*   No results for input(s): LIPASE, AMYLASE in the last 168 hours. No results for input(s): AMMONIA in the last 168 hours.  ABG No results found for: PHART, PCO2ART, PO2ART, HCO3, TCO2, ACIDBASEDEF, O2SAT   Coagulation Profile: No results for input(s): INR, PROTIME in the last 168 hours.  Cardiac Enzymes: No results for input(s): CKTOTAL, CKMB, CKMBINDEX, TROPONINI in the last 168 hours.  HbA1C: Hgb A1c MFr Bld  Date/Time Value Ref Range Status  01/05/2019 04:37 PM 5.2 4.8 - 5.6 % Final    Comment:    (NOTE) Pre diabetes:          5.7%-6.4% Diabetes:              >6.4% Glycemic control for   <7.0% adults with diabetes     CBG: No results for input(s): GLUCAP in the last 168 hours.  Review of Systems: POSITIVES in BOLD  Gen: Denies fever, chills, weight change, fatigue, night sweats HEENT: Denies blurred vision, double vision, hearing loss, tinnitus, sinus congestion, rhinorrhea, sore throat, neck stiffness, dysphagia PULM: Denies shortness of breath, cough, sputum production, hemoptysis, wheezing CV: Denies chest pain, edema, orthopnea, paroxysmal nocturnal dyspnea, palpitations GI: Denies abdominal pain, nausea, vomiting, diarrhea, hematochezia, melena, constipation, change in bowel habits GU: Denies dysuria, hematuria, polyuria, oliguria, urethral discharge Endocrine: Denies hot or cold intolerance, polyuria, polyphagia or appetite change Derm: Denies rash, dry skin, scaling or peeling skin change Heme:  Denies easy bruising, bleeding, bleeding gums Neuro: Denies headache, numbness, weakness, slurred speech, loss of memory or consciousness  Past Medical History:  He,  has a past medical history of Anemia, Bowel perforation (HCC), Chronic combined systolic (congestive) and diastolic (congestive) heart failure (HCC), Chronic diarrhea, Diverticulosis, sigmoid (02/27/2018), NICM (nonischemic cardiomyopathy) ([place]), Nonobstructive CAD (coronary artery disease), and Stuttering during school years.   Surgical History:   Past Surgical History:  Procedure Laterality Date  . APPENDECTOMY    . COLONOSCOPY WITH PROPOFOL N/A 02/27/2018   Procedure: COLONOSCOPY WITH PROPOFOL;  Surgeon: Lin Landsman, MD;  Location[facility] ENDOSCOPY;  Service: Gastroenterology;  Laterality: N/A;  . EYE SURGERY    . RIGHT/LEFT HEART CATH AND CORONARY ANGIOGRAPHY N/A 02/09/2019   Procedure: RIGHT/LEFT HEART CATH AND CORONARY ANGIOGRAPHY;  Surgeon: Wellington Hampshire, MD;  Location: [facility];  Service: Cardiovascular;  Laterality: N/A;     Social History:   reports that he has never smoked. He has never used smokeless tobacco. He reports that he does not drink alcohol and does not use drugs.   Family History:  His family history includes Cancer in his brother and father; Uterine cancer in his mother.  Allergies No Known Allergies   Home Medications  Prior to Admission medications   Medication Sig Start Date End Date Taking? Authorizing Provider  losartan (COZAAR) 25 MG tablet Take 12.5 mg by mouth daily. 07/17/20  Yes [provider]  metoprolol succinate (TOPROL XL) 25 MG 24 hr tablet Take 0.5 tablets (12.5 mg total) by mouth daily. 01/23/21  Yes Wellington Hampshire, MD  potassium chloride (KLOR-CON) 10 MEQ tablet Take 2 tablets (20 mEq total) by mouth daily as needed (when taking fluid pill). Patient taking differently: Take 20 mEq by mouth daily. 07/15/20  Yes Dunn, Areta Haber, PA-C  torsemide  (DEMADEX) 20 MG tablet Take 1 tablet (20 mg total) each morning. Take additional dose (20 mg total) on Monday and Thursday afternoons Patient taking differently: Take 20 mg by mouth daily. 09/01/20  Yes Rise Mu, PA-C     Level 3 follow-up     Deserie Dirks

## 2021-02-16 LAB — BASIC METABOLIC PANEL
Anion gap: 5 (ref 5–15)
BUN: 43 mg/dL — ABNORMAL HIGH (ref 8–23)
CO2: 18 mmol/L — ABNORMAL LOW (ref 22–32)
Calcium: 8.1 mg/dL — ABNORMAL LOW (ref 8.9–10.3)
Chloride: 115 mmol/L — ABNORMAL HIGH (ref 98–111)
Creatinine, Ser: 2.98 mg/dL — ABNORMAL HIGH (ref 0.61–1.24)
GFR, Estimated: 21 mL/min — ABNORMAL LOW (ref >60)
Glucose, Bld: 91 mg/dL (ref 70–99)
Potassium: 4.8 mmol/L (ref 3.5–5.1)
Sodium: 138 mmol/L (ref 135–145)

## 2021-02-16 LAB — CBC
HCT: 22.7 % — ABNORMAL LOW (ref 39.0–52.0)
Hemoglobin: 7 g/dL — ABNORMAL LOW (ref 13.0–17.0)
MCH: 29.9 pg (ref 26.0–34.0)
MCHC: 30.8 g/dL (ref 30.0–36.0)
MCV: 97 fL (ref 80.0–100.0)
Platelets: 205 10*3/uL (ref 150–400)
RBC: 2.34 MIL/uL — ABNORMAL LOW (ref 4.22–5.81)
RDW: 15.4 % (ref 11.5–15.5)
WBC: 8.2 10*3/uL (ref 4.0–10.5)
nRBC: 0 % (ref 0.0–0.2)

## 2021-02-16 LAB — MAGNESIUM: Magnesium: 2 mg/dL (ref 1.7–2.4)

## 2021-02-16 LAB — PHOSPHORUS: Phosphorus: 3.7 mg/dL (ref 2.5–4.6)

## 2021-02-16 MED ORDER — SODIUM BICARBONATE 8.4 % IV SOLN
100.0000 meq | Freq: Once | INTRAVENOUS | Status: AC
  Administered 2021-02-16: 100 meq via INTRAVENOUS
  Filled 2021-02-16: qty 50

## 2021-02-16 MED ORDER — SODIUM BICARBONATE 650 MG PO TABS
650.0000 mg | ORAL_TABLET | Freq: Three times a day (TID) | ORAL | Status: DC
  Administered 2021-02-16 – 2021-02-18 (×6): 650 mg via ORAL
  Filled 2021-02-16 (×9): qty 1

## 2021-02-16 NOTE — Consult Note (Signed)
[facility] for Electrolyte Monitoring and Replacement   Recent Labs: Potassium (mmol/L)  Date Value  02/16/2021 4.8   Magnesium (mg/dL)  Date Value  02/16/2021 2.0   Calcium (mg/dL)  Date Value  02/16/2021 8.1 (L)   Albumin (g/dL)  Date Value  02/11/2021 2.1 (L)  06/29/2020 4.3   Phosphorus (mg/dL)  Date Value  02/16/2021 3.7   Sodium (mmol/L)  Date Value  02/16/2021 138  06/29/2020 143   Corrected Ca: 9.6 mg/dL  Assessment: Pt is a 75 yo presented to the ED on 2/15 due to a syncopal event with loss of consciousness. Suspected sepsis with septic shock due to suspected UTI in the setting of chronic cystitis. His SCr is stabilizinng  Pharmacy has been consulted to monitor and replenish electrolytes.   MIVF: lactated ringers at 125 mL/hr  Goal of Therapy:  Electrolytes WNL  Plan:   no electrolyte replacement warranted for today  Will follow K, Mg, and Phos with am labs  Dallie Piles, PharmD, BCPS Clinical Pharmacist 02/16/2021 7:34 AM

## 2021-02-16 NOTE — Progress Notes (Signed)
NAME:  William Holland, MRN:  [id number], DOB:  [birth year], LOS: 9 ADMISSION DATE:  01/29/2021, INITIAL CONSULTATION DATE:  02/08/2021 REFERRING MD:  Dr. Cherylann Banas, CHIEF COMPLAINT: Abdominal Pain & Syncope  Brief History:  75 yo male presenting to the ED in septic shock due to UTI requiring low dose pressors, admitted to ICU.  History of Present Illness:  75 yo male presented to the ED after a syncopal event with loss of consciousness. The patient's brother reported the patient was sitting, getting his hair cut when he fell over and became unresponsive for about 15 minutes. The patient denied experiencing any symptoms prior to falling over, no dizziness/lightheadness/prodromal symptoms. Only other complaint is mild pain to light palpation of abdomen which is chronically distended. Patient denies fever/chills/cough, does admit to diarrhea but states this is chronic. Patient denies that anything like this has ever happened to him before.   ED course: Initial vitals: hypothermic- 95.4/ NSR- 96/ RR- 13/ hypotensive- 71/52 (60). Labs significant for: leukocytosis-WBC 25.1, hyperkalemic 5.6, Cl 115, serum CO2 14, BUN/Cr 110/4.92, BNP 128.4, troponin 18~20, lactic 1.1, PCT 1.87  Past Medical History:  CAD NICM Diverticulosis Chronic combined systolic & diastolic HF  Significant Hospital Events:  01/26/2021 Admission with suspected UTI, low dose levophed 2/16: Renal US with moderate bilateral hydronephrosis, multiple large bladder calculi 2/17: Urology consulted;recommedns antibiotics and follow up cultures; recommends against aggressive interventions including ureteral stents or nephrostomy tubes 2/18: Weaning Levophed, currently at 3 mcg  Consults:  Nephrology Urology  Procedures:  N/A  Significant Diagnostic Tests:  01/25/2021 CT Abdomen pelvis >>  Findings of chronic cystitis with interval development of moderate bilateral hydronephrosis. There are a few small foci of air within the left renal  pelvis. Findings raise suspicion for ascending urinary tract infection. Correlate with urinalysis. Persistent severe diffuse colonic dilatation with transition point again noted within the mid sigmoid colon. Circumferentially thickened 6 cm segment of mid sigmoid colon containing numerous diverticula. Similar degree of mild adjacent fat stranding. Findings may represent stricture related to chronic diverticulitis. Underlying colonic neoplasm not excluded. Loss of fat plane between the bladder dome and thickened sigmoid colon raises the possibility of a colovesical fistula. 02/16/2021: CT Head>>1. No acute intracranial abnormality. 2/16: Renal US>>Moderate bilateral hydronephrosis is noted. Continued presence of multiple large bladder calculi.  Micro Data:  01/29/2021 COVID 19 >>negative 02/11/2021 BC x 2 >>no growth to date 2/15: Urine>>  >=100,000 COLONIES/mL MULTIPLE SPECIES PRESENT, SUGGEST RECOLLECTIONAbnormal  2/16: MRSA PCR>>negative 2/18: GI panel>> 2/18: C.diff>>  Antimicrobials:  Cefepime 2/15 >>2/17 Flagyl 2/15 >>2/17 Vancomycin x 1 dose 2/15 Zosyn 2/17>>  Interval History:  -Continues with liquid stools -Pt is awake and alert -Hemodynamically stable, afebrile -Weaning down Levophed (currenlty at 3 mcg); starting Midodrine -Bicarb gtt being discontinued due to metabolic alkalosis -Pt noted to have large volume of diarrhea in rectal tube;  GI panel and C. Diff negative  Objective   Blood pressure (!) 103/44, pulse 65, temperature 99.2 F (37.3 C), temperature source Oral, resp. rate 14, height 5' 6"  (1.676 m), weight 83.5 kg, SpO2 97 %.        Intake/Output Summary (Last 24 hours) at 02/16/2021 1832 Last data filed at 02/16/2021 1600 Gross per 24 hour  Intake 5558.41 ml  Output 2000 ml  Net 3558.41 ml   Filed Weights   02/11/21 0500 02/12/21 0611 02/16/21 0500  Weight: 73 kg 79 kg 83.5 kg    Examination: General: Acute on chronically ill appearing male,n NAD HEENT:  Atraumatic, normocephalic, neck supple, no JVD Pulm: Clear to auscultation bilaterally, even, nonlabored, normal effort CV: Regular rate and rhythm, s1s2, no M/R/G, 2+ distal pulses GI: Protuberant, no tenderness, no guarding or rebound tenderness, BS+ Skin: Warm and dry.  No obvious rashes, lesions, or ulcerations Extremities: No deformities, trace edema bilateral LE Neuro: Alert, oriented to person, place, and situation.  Follows commands, no focal deficits, speech clear, pupils PERRL Resolved Hospital Problem list   N/A  Assessment & Plan:  RESP: Stable CXR wihtout lobar infiltrate Room air  ID: Septic Shock in the setting of UTI HFrEF- LVEF 30-35% Hx: NICM, CAD -Remains low dose of levophed -Maintain MAP 60-65 -Continuous cardiac monitoring -Gentle IV fluids -Started midodrine, BP seems to be holding well over 60 -Home medications on hold in the setting of hypotension & AKI on CKD: metoprolol, losartan, torsemide. - TSH 9.773, trend from weekend -Cortisol 11 -Tolerate either MAP > or systolic >314 and wean pressors to off  Severe Sepsis Due to suspected UTI in the setting of chronic cystitis  Lactic: 1.1, Baseline PCT: 1.87, UA: had large leukocytes, multi-flora CXR: chronic without acute disease, CT Abdomen: concern for UTI Cefepime/ Vancomycin/ Metronidazole -Monitor fever curve -Trend mild leukocytosis and procalcitonin -Follow cultures as above -Continue IV Zosyn pending cultures and sensitiviites  RENAL: Lab Results  Component Value Date   CREATININE 2.98 (H) 02/16/2021   BUN 43 (H) 02/16/2021   NA 138 02/16/2021   K 4.8 02/16/2021   CL 115 (H) 02/16/2021   CO2 18 (L) 02/16/2021    Intake/Output Summary (Last 24 hours) at 02/16/2021 1832 Last data filed at 02/16/2021 1600 Gross per 24 hour  Intake 5558.41 ml  Output 2000 ml  Net 3558.41 ml    Net IO Since Admission: 15,472.4 mL [02/16/21 1832]  Acute Kidney Injury on CKD Stage IV (In the setting of  shock. Baseline creatinine 2.36-2.57, creatinine on admission 4.92) Bilateral Hydronephrosis Hypokalemia Metabolic Acidosis>>resolved Metabolic Alkalosis -Monitor I&O's / urinary output -Follow BMP -Ensure adequate renal perfusion -Avoid nephrotoxic agents as able -Replace electrolytes as indicated -Nephrology following, appreciate input -Bicarb gtt d/c by Nephrology due to metabolic alkalosis -Starting NS w/ KCL -Urology consulted, did not recommend any aggressive intervention with ureteral stents or nephrostomy tubes -oral bicarb for loses (non-gap acidosis)  GI: SCAD Appreciate GI eval recommendations Try psyllium and prednisone Chronic component of current issue likely largest problem KUB   HEME: Anemia -Monitor for S/Sx of bleeding -Trend CBC -Heparin SQ for VTE Prophylaxis  -[employer] Witness Will accept albumin [place] LE for DVT, there is some swelling      Best practice (evaluated daily)  Diet: Heart healthy Pain/Anxiety/Delirium protocol (if indicated): N/a VAP protocol (if indicated): n/a DVT prophylaxis: heparin SQ GI prophylaxis: N/a Glucose control: monitor as needed Mobility: bedrest, mobilize as tolerated Disposition:ICU  Goals of Care:  Last date of multidisciplinary goals of care discussion:02/10/21 Family and staff present: Pt and bedside RN; no family available Summary of discussion: plan of care including antibiotics & vasopressors Follow up goals of care discussion due: 02/11/21 Code Status: FULL  Labs   CBC: Recent Labs  Lab 02/12/21 0527 02/13/21 0319 02/14/21 0419 02/15/21 0651 02/16/21 0513  WBC 11.4* 11.0* 11.6* 8.9 8.2  HGB 7.5* 7.3* 7.2* 7.1* 7.0*  HCT 23.9* 23.8* 22.9* 23.5* 22.7*  MCV 93.4 96.4 95.0 97.5 97.0  PLT 213 211 210 203 970    Basic Metabolic Panel: Recent Labs  Lab 02/12/21 0527 02/13/21 0319 02/14/21 0419  02/15/21 0651 02/16/21 0513  NA 141 140 140 140 138  K 4.1 4.5 4.4 4.9 4.8  CL 113* 114* 116* 116*  115*  CO2 19* 19* 18* 19* 18*  GLUCOSE 97 99 89 89 91  BUN 59* 53* 46* 45* 43*  CREATININE 2.94* 3.09* 2.88* 2.96* 2.98*  CALCIUM 8.0* 7.9* 8.2* 8.2* 8.1*  MG 1.8 1.8 1.6* 2.5* 2.0  PHOS 2.9 3.0 3.2 3.4 3.7   GFR: Estimated Creatinine Clearance: 22.1 mL/min (A) (by C-G formula based on SCr of 2.98 mg/dL (H)). Recent Labs  Lab 02/13/21 0319 02/14/21 0419 02/15/21 0651 02/16/21 0513  WBC 11.0* 11.6* 8.9 8.2    Liver Function Tests: Recent Labs  Lab 02/11/21 0624  AST 13*  ALT 8  ALKPHOS 43  BILITOT 0.5  PROT 5.5*  ALBUMIN 2.1*   No results for input(s): LIPASE, AMYLASE in the last 168 hours. No results for input(s): AMMONIA in the last 168 hours.  ABG No results found for: PHART, PCO2ART, PO2ART, HCO3, TCO2, ACIDBASEDEF, O2SAT   Coagulation Profile: No results for input(s): INR, PROTIME in the last 168 hours.  Cardiac Enzymes: No results for input(s): CKTOTAL, CKMB, CKMBINDEX, TROPONINI in the last 168 hours.  HbA1C: Hgb A1c MFr Bld  Date/Time Value Ref Range Status  01/05/2019 04:37 PM 5.2 4.8 - 5.6 % Final    Comment:    (NOTE) Pre diabetes:          5.7%-6.4% Diabetes:              >6.4% Glycemic control for   <7.0% adults with diabetes     CBG: No results for input(s): GLUCAP in the last 168 hours.  Review of Systems: POSITIVES in BOLD  Gen: Denies fever, chills, weight change, fatigue, night sweats HEENT: Denies blurred vision, double vision, hearing loss, tinnitus, sinus congestion, rhinorrhea, sore throat, neck stiffness, dysphagia PULM: Denies shortness of breath, cough, sputum production, hemoptysis, wheezing CV: Denies chest pain, edema, orthopnea, paroxysmal nocturnal dyspnea, palpitations GI: Denies abdominal pain, nausea, vomiting, diarrhea, hematochezia, melena, constipation, change in bowel habits GU: Denies dysuria, hematuria, polyuria, oliguria, urethral discharge Endocrine: Denies hot or cold intolerance, polyuria, polyphagia or  appetite change Derm: Denies rash, dry skin, scaling or peeling skin change Heme: Denies easy bruising, bleeding, bleeding gums Neuro: Denies headache, numbness, weakness, slurred speech, loss of memory or consciousness  Past Medical History:  He,  has a past medical history of Anemia, Bowel perforation (HCC), Chronic combined systolic (congestive) and diastolic (congestive) heart failure (HCC), Chronic diarrhea, Diverticulosis, sigmoid (02/27/2018), NICM (nonischemic cardiomyopathy) ([place]), Nonobstructive CAD (coronary artery disease), and Stuttering during school years.   Surgical History:   Past Surgical History:  Procedure Laterality Date  . APPENDECTOMY    . COLONOSCOPY WITH PROPOFOL N/A 02/27/2018   Procedure: COLONOSCOPY WITH PROPOFOL;  Surgeon: Lin Landsman, MD;  Location: [facility] ENDOSCOPY;  Service: Gastroenterology;  Laterality: N/A;  . EYE SURGERY    . RIGHT/LEFT HEART CATH AND CORONARY ANGIOGRAPHY N/A 02/09/2019   Procedure: RIGHT/LEFT HEART CATH AND CORONARY ANGIOGRAPHY;  Surgeon: Wellington Hampshire, MD;  Location: [facility];  Service: Cardiovascular;  Laterality: N/A;     Social History:   reports that he has never smoked. He has never used smokeless tobacco. He reports that he does not drink alcohol and does not use drugs.   Family History:  His family history includes Cancer in his brother and father; Uterine cancer in his mother.   Allergies No  Known Allergies   Home Medications  Prior to Admission medications   Medication Sig Start Date End Date Taking? Authorizing Provider  losartan (COZAAR) 25 MG tablet Take 12.5 mg by mouth daily. 07/17/20  Yes [provider]  metoprolol succinate (TOPROL XL) 25 MG 24 hr tablet Take 0.5 tablets (12.5 mg total) by mouth daily. 01/23/21  Yes Wellington Hampshire, MD  potassium chloride (KLOR-CON) 10 MEQ tablet Take 2 tablets (20 mEq total) by mouth daily as needed (when taking fluid pill). Patient taking  differently: Take 20 mEq by mouth daily. 07/15/20  Yes Dunn, Areta Haber, PA-C  torsemide (DEMADEX) 20 MG tablet Take 1 tablet (20 mg total) each morning. Take additional dose (20 mg total) on Monday and Thursday afternoons Patient taking differently: Take 20 mg by mouth daily. 09/01/20  Yes Rise Mu, PA-C     Level 3 follow-up     Juwan Vences

## 2021-02-16 NOTE — Progress Notes (Signed)
[facility]  ROUNDING NOTE   Subjective:   Hospital Course: admitted on 2/15 for near syncope  Having large amount of liquid stools Abdomen is still distended UOP is low   S Creatinine is about the same States he is able to eat without nausea or vomiting     Objective:  Vital signs in last 24 hours:  Temp:  [97.7 F (36.5 C)-99 F (37.2 C)] 99 F (37.2 C) (02/24 0730) Pulse Rate:  [47-88] 60 (02/24 0915) Resp:  [9-26] 13 (02/24 0915) BP: (86-126)/(33-89) 110/40 (02/24 0915) SpO2:  [94 %-100 %] 98 % (02/24 0915)  Weight change:  Filed Weights   02/14/2021 0912 02/11/21 0500 02/12/21 0611  Weight: 67 kg 73 kg 79 kg    Intake/Output: I/O last 3 completed shifts: In: 2700.8 [P.O.:480; I.V.:2120.6; IV Piggyback:100.2] Out: 2800 [Stool:2800]   Intake/Output this shift:  Total I/O In: 1914.8 [P.O.:240; I.V.:1674.8] Out: -   Physical Exam: General: Ill appearing  Head: Normocephalic, atraumatic. Moist oral mucosal membranes  Lungs:  Clear to auscultation  Heart: Regular rate and rhythm  Abdomen:  Soft, nontender, +distended, tympanic  Extremities:  + dependent peripheral edema.  Neurologic: Nonfocal, moving all four extremities  Skin: No lesions  Access:   Rectal tube    Basic Metabolic Panel: Recent Labs  Lab 02/12/21 0527 02/13/21 0319 02/14/21 0419 02/15/21 0651 02/16/21 0513  NA 141 140 140 140 138  K 4.1 4.5 4.4 4.9 4.8  CL 113* 114* 116* 116* 115*  CO2 19* 19* 18* 19* 18*  GLUCOSE 97 99 89 89 91  BUN 59* 53* 46* 45* 43*  CREATININE 2.94* 3.09* 2.88* 2.96* 2.98*  CALCIUM 8.0* 7.9* 8.2* 8.2* 8.1*  MG 1.8 1.8 1.6* 2.5* 2.0  PHOS 2.9 3.0 3.2 3.4 3.7    Liver Function Tests: Recent Labs  Lab 02/11/21 0624  AST 13*  ALT 8  ALKPHOS 43  BILITOT 0.5  PROT 5.5*  ALBUMIN 2.1*   No results for input(s): LIPASE, AMYLASE in the last 168 hours. No results for input(s): AMMONIA in the last 168 hours.  CBC: Recent Labs  Lab  02/12/21 0527 02/13/21 0319 02/14/21 0419 02/15/21 0651 02/16/21 0513  WBC 11.4* 11.0* 11.6* 8.9 8.2  HGB 7.5* 7.3* 7.2* 7.1* 7.0*  HCT 23.9* 23.8* 22.9* 23.5* 22.7*  MCV 93.4 96.4 95.0 97.5 97.0  PLT 213 211 210 203 205    Cardiac Enzymes: No results for input(s): CKTOTAL, CKMB, CKMBINDEX, TROPONINI in the last 168 hours.  BNP: Invalid input(s): POCBNP  CBG: No results for input(s): GLUCAP in the last 168 hours.  Microbiology: Results for orders placed or performed during the hospital encounter of 01/27/2021  Culture, blood (routine x 2)     Status: None   Collection Time: 02/02/2021 10:29 AM   Specimen: BLOOD  Result Value Ref Range Status   Specimen Description BLOOD LEFT ANTECUBITAL  Final   Special Requests   Final    BOTTLES DRAWN AEROBIC AND ANAEROBIC Blood Culture results may not be optimal due to an excessive volume of blood received in culture bottles   Culture   Final    NO GROWTH 5 DAYS Performed at [facility], [address] [place], [place] [address]    Report Status 02/12/2021 FINAL  Final  Culture, blood (routine x 2)     Status: None   Collection Time: 01/30/2021 10:30 AM   Specimen: BLOOD  Result Value Ref Range Status   Specimen Description  BLOOD BLOOD LEFT FOREARM  Final   Special Requests   Final    BOTTLES DRAWN AEROBIC ONLY Blood Culture adequate volume   Culture   Final    NO GROWTH 5 DAYS Performed at [facility], [address] [place], [place] [address]    Report Status 02/12/2021 FINAL  Final  SARS CORONAVIRUS 2 (TAT 6-24 HRS) Nasopharyngeal Nasopharyngeal Swab     Status: None   Collection Time: 02/15/2021 12:09 PM   Specimen: Nasopharyngeal Swab  Result Value Ref Range Status   SARS Coronavirus 2 NEGATIVE NEGATIVE Final    Comment: (NOTE) SARS-CoV-2 target nucleic acids are NOT DETECTED.  The SARS-CoV-2 RNA is generally detectable in upper and lower respiratory specimens during the acute phase of infection.  Negative results do not preclude SARS-CoV-2 infection, do not rule out co-infections with other pathogens, and should not be used as the sole basis for treatment or other patient management decisions. Negative results must be combined with clinical observations, patient history, and epidemiological information. The expected result is Negative.  Fact Sheet for Patients: SugarRoll.be  Fact Sheet for Healthcare Providers: https://www.woods-mathews.com/  This test is not yet approved or cleared by the [place] FDA and  has been authorized for detection and/or diagnosis of SARS-CoV-2 by FDA under an Emergency Use Authorization (EUA). This EUA will remain  in effect (meaning this test can be used) for the duration of the COVID-19 declaration under Se ction 564(b)(1) of the Act, 21 U.S.C. section 360bbb-3(b)(1), unless the authorization is terminated or revoked sooner.  Performed at [facility], [place] [address], [place], [place] [address]   Urine Culture     Status: Abnormal   Collection Time: 02/13/2021  4:16 PM   Specimen: Urine, Random  Result Value Ref Range Status   Specimen Description   Final    URINE, RANDOM Performed at [facility], [address], [place], [place] [address]    Special Requests   Final    NONE Performed at [facility], [address] [place], [place] [address]    Culture (A)  Final    >=100,000 COLONIES/mL MULTIPLE SPECIES PRESENT, SUGGEST RECOLLECTION   Report Status 02/09/2021 FINAL  Final  MRSA PCR Screening     Status: None   Collection Time: 02/08/21  6:30 AM   Specimen: Nasopharyngeal  Result Value Ref Range Status   MRSA by PCR NEGATIVE NEGATIVE Final    Comment:        The GeneXpert MRSA Assay (FDA approved for NASAL specimens only), is one component of a comprehensive MRSA colonization surveillance program. It is not intended to diagnose MRSA infection nor to guide  or monitor treatment for MRSA infections. Performed at [facility], [address] [place], [place] [address]   Gastrointestinal Panel by PCR , Stool     Status: None   Collection Time: 02/10/21 12:36 PM   Specimen: Stool  Result Value Ref Range Status   Campylobacter species NOT DETECTED NOT DETECTED Final   Plesimonas shigelloides NOT DETECTED NOT DETECTED Final   Salmonella species NOT DETECTED NOT DETECTED Final   Yersinia enterocolitica NOT DETECTED NOT DETECTED Final   Vibrio species NOT DETECTED NOT DETECTED Final   Vibrio cholerae NOT DETECTED NOT DETECTED Final   Enteroaggregative E coli (EAEC) NOT DETECTED NOT DETECTED Final   Enteropathogenic E coli (EPEC) NOT DETECTED NOT DETECTED Final   Enterotoxigenic E coli (ETEC) NOT DETECTED NOT DETECTED Final   Shiga  like toxin producing E coli (STEC) NOT DETECTED NOT DETECTED Final   Shigella/Enteroinvasive E coli (EIEC) NOT DETECTED NOT DETECTED Final   Cryptosporidium NOT DETECTED NOT DETECTED Final   Cyclospora cayetanensis NOT DETECTED NOT DETECTED Final   Entamoeba histolytica NOT DETECTED NOT DETECTED Final   Giardia lamblia NOT DETECTED NOT DETECTED Final   Adenovirus F40/41 NOT DETECTED NOT DETECTED Final   Astrovirus NOT DETECTED NOT DETECTED Final   Norovirus GI/GII NOT DETECTED NOT DETECTED Final   Rotavirus A NOT DETECTED NOT DETECTED Final   Sapovirus (I, II, IV, and V) NOT DETECTED NOT DETECTED Final    Comment: Performed at [facility], [address] [place], [place] [address]  C Difficile Quick Screen (NO PCR Reflex)     Status: None   Collection Time: 02/10/21 12:36 PM   Specimen: STOOL  Result Value Ref Range Status   C Diff antigen NEGATIVE NEGATIVE Final   C Diff toxin NEGATIVE NEGATIVE Final   C Diff interpretation No C. difficile detected.  Final    Comment: Performed at [facility], [address] [place], [place] [address]    Coagulation Studies: No results for  input(s): LABPROT, INR in the last 72 hours.  Urinalysis: No results for input(s): COLORURINE, LABSPEC, PHURINE, GLUCOSEU, HGBUR, BILIRUBINUR, KETONESUR, PROTEINUR, UROBILINOGEN, NITRITE, LEUKOCYTESUR in the last 72 hours.  Invalid input(s): APPERANCEUR    Imaging: No results found.   Medications:   . sodium chloride Stopped (02/16/21 0524)  . lactated ringers 125 mL/hr at 02/16/21 0730  . norepinephrine (LEVOPHED) Adult infusion 2 mcg/min (02/16/21 0730)  . sodium chloride     . sodium chloride   Intravenous Once  . Chlorhexidine Gluconate Cloth  6 each Topical Daily  . heparin  5,000 Units Subcutaneous Q8H  . levothyroxine  12.5 mcg Intravenous Daily  . midodrine  10 mg Oral TID WC  . psyllium  1 packet Oral Daily   acetaminophen, oxyCODONE-acetaminophen  Assessment/ Plan:  [name] is a 75 y.o. white male with hypertension, coronary artery disease, congestive heart failure, history of bowel perforation,  who was admitted to [facility] on 02/13/2021 for Cystitis [N30.90]  1.  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  CT abdomen 2/15- chronic cystitis with moderate bilateral hydronephrosis with bladder calculi -chronic. Appreciate urology input.   02/23 0701 - 02/24 0700 In: 2700.8 [P.O.:480; I.V.:2120.6; IV Piggyback:100.2] Out: 2000 [Stool:2000]   Lab Results  Component Value Date   CREATININE 2.98 (H) 02/16/2021   CREATININE 2.96 (H) 02/15/2021   CREATININE 2.88 (H) 02/14/2021  GFR 21 S Creatinine trend appears to be stabilizing   Continue supportive care and maintainence fluids    2. Hypotension:   Now on midodrine   3. Proteinuria UPC 3.68 K/l ratio 1.68  ANA negative  4. Chronic diarrhea GI eval ongoing Segmental colitis associated with diverticulosis (SCAD) H/o colonoscopy 02/2018 C Diff neg     LOS:  9 William Holland 2/24/202210:17 AM

## 2021-02-17 ENCOUNTER — Inpatient Hospital Stay: Payer: Medicare Other

## 2021-02-17 LAB — CBC
HCT: 22 % — ABNORMAL LOW (ref 39.0–52.0)
Hemoglobin: 6.5 g/dL — ABNORMAL LOW (ref 13.0–17.0)
MCH: 29.4 pg (ref 26.0–34.0)
MCHC: 29.5 g/dL — ABNORMAL LOW (ref 30.0–36.0)
MCV: 99.5 fL (ref 80.0–100.0)
Platelets: 204 10*3/uL (ref 150–400)
RBC: 2.21 MIL/uL — ABNORMAL LOW (ref 4.22–5.81)
RDW: 15.3 % (ref 11.5–15.5)
WBC: 9.2 10*3/uL (ref 4.0–10.5)
nRBC: 0 % (ref 0.0–0.2)

## 2021-02-17 LAB — BASIC METABOLIC PANEL
Anion gap: 5 (ref 5–15)
BUN: 42 mg/dL — ABNORMAL HIGH (ref 8–23)
CO2: 19 mmol/L — ABNORMAL LOW (ref 22–32)
Calcium: 8.1 mg/dL — ABNORMAL LOW (ref 8.9–10.3)
Chloride: 116 mmol/L — ABNORMAL HIGH (ref 98–111)
Creatinine, Ser: 2.87 mg/dL — ABNORMAL HIGH (ref 0.61–1.24)
GFR, Estimated: 22 mL/min — ABNORMAL LOW (ref >60)
Glucose, Bld: 98 mg/dL (ref 70–99)
Potassium: 5 mmol/L (ref 3.5–5.1)
Sodium: 140 mmol/L (ref 135–145)

## 2021-02-17 LAB — PHOSPHORUS: Phosphorus: 3.7 mg/dL (ref 2.5–4.6)

## 2021-02-17 LAB — MAGNESIUM: Magnesium: 2 mg/dL (ref 1.7–2.4)

## 2021-02-17 NOTE — NC FL2 (Signed)
[place] LEVEL OF CARE SCREENING TOOL     IDENTIFICATION  Patient Name: William Holland Birthdate: Mar 08, 1946 Sex: male Admission Date (Current Location): 02/01/2021  [place] and [place] Number:  Engineering geologist and Address:  [facility], [address], [place], [place] [address]      Provider Number: [id number]  Attending Physician Name and Address:  Nelle Don, MD  Relative Name and Phone Number:  Kinsler Soeder [phone]    Current Level of Care: Hospital Recommended Level of Care: [place] Prior Approval Number:    Date Approved/Denied:   PASRR Number: [id number]  Discharge Plan: SNF    Current Diagnoses: Patient Active Problem List   Diagnosis Date Noted  . Severe sepsis with septic shock ([place]) 01/29/2021  . CAD (coronary artery disease) 02/11/2021  . Anemia in chronic kidney disease 02/15/2021  . Acute renal failure with tubular necrosis ([place]) 02/17/2021  . Syncope 01/30/2021  . Hydronephrosis, bilateral 01/28/2021  . Hyperkalemia 02/12/2021  . Elevated troponin 02/16/2021  . UTI (urinary tract infection) 02/20/2021  . Cystitis 02/15/2021  . Chronic combined systolic and diastolic CHF, NYHA class 2 ([place]) 02/05/2019  . Chronic systolic (congestive) heart failure ([place]) 01/23/2019  . Hypotension 01/23/2019  . Diverticulosis 01/23/2019  . Lymphedema 01/23/2019  . Hypokalemia   . Bowel perforation ([place])   . CHF exacerbation ([place]) 01/05/2019  . Pressure injury of skin 01/05/2019  . Chronic diarrhea of unknown origin   . Loss of weight   . Abdominal pain 03/10/2016  . Colitis 03/10/2016    Orientation RESPIRATION BLADDER Height & Weight     Self,Time,Situation,Place  Normal Incontinent Weight: 83.5 kg Height:  5' 6"  (167.6 cm)  BEHAVIORAL SYMPTOMS/MOOD NEUROLOGICAL BOWEL NUTRITION STATUS      Continent Diet (Heart Healthy)  AMBULATORY STATUS COMMUNICATION OF NEEDS Skin   Extensive Assist  Verbally Normal                       Personal Care Assistance Level of Assistance  Bathing,Feeding,Dressing Bathing Assistance: Maximum assistance Feeding assistance: Limited assistance Dressing Assistance: Maximum assistance     Functional Limitations Info             SPECIAL CARE FACTORS FREQUENCY  PT (By licensed PT),OT (By licensed OT)                    Contractures Contractures Info: Not present    Additional Factors Info  Code Status,Allergies Code Status Info: DNR Allergies Info: No known allergies           Current Medications (02/17/2021):  This is the current hospital active medication list Current Facility-Administered Medications  Medication Dose Route Frequency Provider Last Rate Last Admin  . 0.9 %  sodium chloride infusion (Manually program via Guardrails IV Fluids)   Intravenous Once Nelle Don, MD      . 0.9 %  sodium chloride infusion  250 mL Intravenous Continuous Rust-Chester, Huel Cote, NP   Stopped at 02/16/21 0524  . acetaminophen (TYLENOL) tablet 650 mg  650 mg Oral Q6H PRN Awilda Bill, NP   650 mg at 02/12/21 1823  . Chlorhexidine Gluconate Cloth 2 % PADS 6 each  6 each Topical Daily Tyler Pita, MD   6 each at 02/17/21 1425  . levothyroxine (SYNTHROID, LEVOTHROID) injection 12.5 mcg  12.5 mcg Intravenous Daily Lu Duffel, RPH   12.5 mcg at 02/17/21 9470  . midodrine (  PROAMATINE) tablet 10 mg  10 mg Oral TID WC Tyler Pita, MD   10 mg at 02/17/21 1159  . oxyCODONE-acetaminophen (PERCOCET/ROXICET) 5-325 MG per tablet 1-2 tablet  1-2 tablet Oral Q6H PRN Awilda Bill, NP   2 tablet at 02/17/21 0325  . psyllium (HYDROCIL/METAMUCIL) 1 packet  1 packet Oral Daily Rust-Chester, Huel Cote, NP   1 packet at 02/17/21 0935  . sodium bicarbonate tablet 650 mg  650 mg Oral TID Nelle Don, MD   650 mg at 02/17/21 0934  . sodium chloride 0.9 % bolus 500 mL  500 mL Intravenous Once Rust-Chester, Huel Cote, NP          Discharge Medications: Please see discharge summary for a list of discharge medications.  Relevant Imaging Results:  Relevant Lab Results:   Additional Information SS# [id number]  Shelbie Ammons, RN

## 2021-02-17 NOTE — Consult Note (Signed)
[place] for Electrolyte Monitoring and Replacement   Recent Labs: Potassium (mmol/L)  Date Value  02/17/2021 5.0   Magnesium (mg/dL)  Date Value  02/17/2021 2.0   Calcium (mg/dL)  Date Value  02/17/2021 8.1 (L)   Albumin (g/dL)  Date Value  02/11/2021 2.1 (L)  06/29/2020 4.3   Phosphorus (mg/dL)  Date Value  02/17/2021 3.7   Sodium (mmol/L)  Date Value  02/17/2021 140  06/29/2020 143   Corrected Ca: 9.6 mg/dL  Assessment: Pt is a 75 yo presented to the ED on 2/15 due to a syncopal event with loss of consciousness. Suspected sepsis with septic shock due to suspected UTI in the setting of chronic cystitis. His SCr is stabilizinng  Pharmacy has been consulted to monitor and replenish electrolytes.   MIVF: lactated ringers at 50 mL/hr  Goal of Therapy:  Electrolytes WNL  Plan:   no electrolyte replacement warranted for today  Will follow K, Mg, and Phos with am labs  Dallie Piles, PharmD, BCPS Clinical Pharmacist 02/17/2021 7:16 AM

## 2021-02-17 NOTE — Evaluation (Addendum)
Physical Therapy Evaluation Patient Details Name: William Holland MRN: [id number] DOB: 1946-02-05 Today's Date: 02/17/2021   History of Present Illness  Pt is a 75 y/o who presented to the ED after a syncopal event with LOC. Pt was getting a haircut when he fell over & became unresponsive for ~15 minutes. Pt being treated for septic shock due to UTI requiring low dose pressors, admitted to ICU. PMH: CAD, NICM, diverticulosis, chronic combined systolic & diastolic HF  Clinical Impression  Pt with low Hgb but practicing Jehovah Witness & unlikely to receive blood transfusion so MD cleared pt for participation in PT treatment.   Pt reports prior to admission he was independent with short distance mobility without AD. Pt currently requires mod/max assist for bed mobility with hospital bed features & MAX assist for sit<>stand EOB with RW. Pt is able to take 1 side step to L but with significant BLE weakness & would benefit from +2 assist to progress forward ambulation. Pt would benefit from STR upon d/c to maximize independence with functional mobility & reduce fall risk prior to return home.     Follow Up Recommendations SNF    Equipment Recommendations  None recommended by PT (TBD in next venue)    Recommendations for Other Services   Addendum: OT consult    Precautions / Restrictions Precautions Precautions: Fall Restrictions Weight Bearing Restrictions: No      Mobility  Bed Mobility Overal bed mobility: Needs Assistance Bed Mobility: Supine to Sit;Sit to Supine     Supine to sit: Mod assist;HOB elevated Sit to supine: Max assist;HOB elevated   General bed mobility comments: assistance to upright trunk supine>sit, assistance to place BLE onto bed sit>supine    Transfers Overall transfer level: Needs assistance Equipment used: Rolling walker (2 wheeled) Transfers: Sit to/from Stand Sit to Stand: Max assist         General transfer comment: significant posterior lean back onto  bed, appropriate hand placement after instruction from PT, extra time to shift pelvis anteriorly, cuing for forward vs downward gaze but poor return demo  Ambulation/Gait Ambulation/Gait assistance: Max assist Gait Distance (Feet): 1 Feet (1 side step to L at EOB) Assistive device: Rolling walker (2 wheeled) Gait Pattern/deviations: Decreased step length - left;Decreased step length - right Gait velocity: decreased   General Gait Details: decreased foot clearance BLE  Stairs            Wheelchair Mobility    Modified Rankin (Stroke Patients Only)       Balance Overall balance assessment: Needs assistance Sitting-balance support: Feet supported;Bilateral upper extremity supported Sitting balance-Leahy Scale: Fair Sitting balance - Comments: close supervision sitting EOB   Standing balance support: During functional activity;Bilateral upper extremity supported Standing balance-Leahy Scale: Zero Standing balance comment: BUE reliance & max assist for standing EOB with RW                             Pertinent Vitals/Pain Pain Assessment: Faces Faces Pain Scale: Hurts even more Pain Location: LLE Pain Descriptors / Indicators: Sore;Aching;Grimacing Pain Intervention(s): Monitored during session;Repositioned    Home Living Family/patient expects to be discharged to:: Private residence Living Arrangements: Other (Comment) (nephew) Available Help at Discharge: Family;Available PRN/intermittently Type of Home: House Home Access: Stairs to enter Entrance Stairs-Rails: Left Entrance Stairs-Number of Steps: 4 Home Layout: Two level;Able to live on main level with bedroom/bathroom Home Equipment: None      Prior Function Level  of Independence: Independent         Comments: Pt reports he doesn't use AD but doesn't "go very far".     Hand Dominance        Extremity/Trunk Assessment   Upper Extremity Assessment Upper Extremity Assessment: Generalized  weakness    Lower Extremity Assessment Lower Extremity Assessment: Generalized weakness (BLE appearing "waxy" with BLE swelling)       Communication   Communication:  (Difficult to understand at times. Unsure if pt has dentures but doesn't have them in.)  Cognition Arousal/Alertness: Awake/alert Behavior During Therapy: WFL for tasks assessed/performed Overall Cognitive Status: No family/caregiver present to determine baseline cognitive functioning (per chart, pt has hx of intellectual disabilities since birth)                                        General Comments General comments (skin integrity, edema, etc.): HR increased to 130 bpm during session, BP 132/69 mmHg (MAP 88), pt without c/o adverse symptoms during session, some leakage from rectal tube & nurse made aware    Exercises     Assessment/Plan    PT Assessment Patient needs continued PT services  PT Problem List Decreased strength;Decreased mobility;Decreased knowledge of precautions;Decreased activity tolerance;Cardiopulmonary status limiting activity;Decreased skin integrity;Decreased balance;Decreased knowledge of use of DME;Pain       PT Treatment Interventions DME instruction;Therapeutic activities;Modalities;Gait training;Therapeutic exercise;Patient/family education;Stair training;Balance training;Functional mobility training;Neuromuscular re-education;Manual techniques    PT Goals (Current goals can be found in the Care Plan section)  Acute Rehab PT Goals Patient Stated Goal: none stated PT Goal Formulation: With patient Time For Goal Achievement: 03/03/21 Potential to Achieve Goals: Fair    Frequency Min 2X/week   Barriers to discharge Decreased caregiver support;Inaccessible home environment      Co-evaluation               AM-PAC PT "6 Clicks" Mobility  Outcome Measure Help needed turning from your back to your side while in a flat bed without using bedrails?: A Little Help  needed moving from lying on your back to sitting on the side of a flat bed without using bedrails?: Total Help needed moving to and from a bed to a chair (including a wheelchair)?: Total Help needed standing up from a chair using your arms (e.g., wheelchair or bedside chair)?: Total Help needed to walk in hospital room?: Total Help needed climbing 3-5 steps with a railing? : Total 6 Click Score: 8    End of Session Equipment Utilized During Treatment: Gait belt Activity Tolerance: Patient limited by fatigue;Patient tolerated treatment well Patient left: in bed;with bed alarm set Nurse Communication:  (rectal tube leaking) PT Visit Diagnosis: Unsteadiness on feet (R26.81);Difficulty in walking, not elsewhere classified (R26.2);Muscle weakness (generalized) (M62.81)    Time: 8546-2703 PT Time Calculation (min) (ACUTE ONLY): 20 min   Charges:   PT Evaluation $PT Eval Moderate Complexity: [place], PT, DPT 02/17/21, 2:59 PM   Waunita Schooner 02/17/2021, 12:48 PM

## 2021-02-17 NOTE — Progress Notes (Signed)
[facility]  ROUNDING NOTE   Subjective:   Hospital Course: admitted on 2/15 for near syncope  Having large amount of liquid stools Abdomen is still distended UOP is low   S Creatinine is about the same States he is able to eat without nausea or vomiting Requiring low dose levophed     Objective:  Vital signs in last 24 hours:  Temp:  [98.1 F (36.7 C)-99.2 F (37.3 C)] 98.1 F (36.7 C) (02/24 1900) Pulse Rate:  [60-94] 72 (02/25 0400) Resp:  [10-20] 11 (02/25 0400) BP: (75-128)/(33-102) 99/42 (02/25 0400) SpO2:  [92 %-100 %] 97 % (02/25 0400) Weight:  [83.5 kg] 83.5 kg (02/25 0429)  Weight change: 0 kg Filed Weights   02/12/21 0611 02/16/21 0500 02/17/21 0429  Weight: 79 kg 83.5 kg 83.5 kg    Intake/Output: I/O last 3 completed shifts: In: 3337.6 [P.O.:600; I.V.:2737.6] Out: 2450 [Stool:2450]   Intake/Output this shift:  No intake/output data recorded.  Physical Exam: General: Ill appearing  Head: Normocephalic, atraumatic. Moist oral mucosal membranes  Lungs:  Clear to auscultation  Heart: Regular rate and rhythm  Abdomen:  Soft, nontender, +distended, tympanic  Extremities:  + dependent peripheral edema. Scrotal edema  Neurologic: Nonfocal, moving all four extremities  Skin: Edema blisters  Access:   Rectal tube    Basic Metabolic Panel: Recent Labs  Lab 02/13/21 0319 02/14/21 0419 02/15/21 0651 02/16/21 0513 02/17/21 0412  NA 140 140 140 138 140  K 4.5 4.4 4.9 4.8 5.0  CL 114* 116* 116* 115* 116*  CO2 19* 18* 19* 18* 19*  GLUCOSE 99 89 89 91 98  BUN 53* 46* 45* 43* 42*  CREATININE 3.09* 2.88* 2.96* 2.98* 2.87*  CALCIUM 7.9* 8.2* 8.2* 8.1* 8.1*  MG 1.8 1.6* 2.5* 2.0 2.0  PHOS 3.0 3.2 3.4 3.7 3.7    Liver Function Tests: Recent Labs  Lab 02/11/21 0624  AST 13*  ALT 8  ALKPHOS 43  BILITOT 0.5  PROT 5.5*  ALBUMIN 2.1*   No results for input(s): LIPASE, AMYLASE in the last 168 hours. No results for input(s): AMMONIA  in the last 168 hours.  CBC: Recent Labs  Lab 02/13/21 0319 02/14/21 0419 02/15/21 0651 02/16/21 0513 02/17/21 0412  WBC 11.0* 11.6* 8.9 8.2 9.2  HGB 7.3* 7.2* 7.1* 7.0* 6.5*  HCT 23.8* 22.9* 23.5* 22.7* 22.0*  MCV 96.4 95.0 97.5 97.0 99.5  PLT 211 210 203 205 204    Cardiac Enzymes: No results for input(s): CKTOTAL, CKMB, CKMBINDEX, TROPONINI in the last 168 hours.  BNP: Invalid input(s): POCBNP  CBG: No results for input(s): GLUCAP in the last 168 hours.  Microbiology: Results for orders placed or performed during the hospital encounter of 01/27/2021  Culture, blood (routine x 2)     Status: None   Collection Time: 02/08/2021 10:29 AM   Specimen: BLOOD  Result Value Ref Range Status   Specimen Description BLOOD LEFT ANTECUBITAL  Final   Special Requests   Final    BOTTLES DRAWN AEROBIC AND ANAEROBIC Blood Culture results may not be optimal due to an excessive volume of blood received in culture bottles   Culture   Final    NO GROWTH 5 DAYS Performed at [facility], [address], [place], [place] [address]    Report Status 02/12/2021 FINAL  Final  Culture, blood (routine x 2)     Status: None   Collection Time: 02/14/2021 10:30 AM   Specimen: BLOOD  Result  Value Ref Range Status   Specimen Description BLOOD BLOOD LEFT FOREARM  Final   Special Requests   Final    BOTTLES DRAWN AEROBIC ONLY Blood Culture adequate volume   Culture   Final    NO GROWTH 5 DAYS Performed at [facility], [place], [place] [address]    Report Status 02/12/2021 FINAL  Final  SARS CORONAVIRUS 2 (TAT 6-24 HRS) Nasopharyngeal Nasopharyngeal Swab     Status: None   Collection Time: 01/25/2021 12:09 PM   Specimen: Nasopharyngeal Swab  Result Value Ref Range Status   SARS Coronavirus 2 NEGATIVE NEGATIVE Final    Comment: (NOTE) SARS-CoV-2 target nucleic acids are NOT DETECTED.  The SARS-CoV-2 RNA is generally detectable in upper and lower respiratory  specimens during the acute phase of infection. Negative results do not preclude SARS-CoV-2 infection, do not rule out co-infections with other pathogens, and should not be used as the sole basis for treatment or other patient management decisions. Negative results must be combined with clinical observations, patient history, and epidemiological information. The expected result is Negative.  Fact Sheet for Patients: SugarRoll.be  Fact Sheet for Healthcare Providers: https://www.woods-mathews.com/  This test is not yet approved or cleared by the [place] FDA and  has been authorized for detection and/or diagnosis of SARS-CoV-2 by FDA under an Emergency Use Authorization (EUA). This EUA will remain  in effect (meaning this test can be used) for the duration of the COVID-19 declaration under Se ction 564(b)(1) of the Act, 21 U.S.C. section 360bbb-3(b)(1), unless the authorization is terminated or revoked sooner.  Performed at [facility], [place] [address], [place], [place] [address]   Urine Culture     Status: Abnormal   Collection Time: 01/29/2021  4:16 PM   Specimen: Urine, Random  Result Value Ref Range Status   Specimen Description   Final    URINE, RANDOM Performed at [facility], [address] [place], [place] [address]    Special Requests   Final    NONE Performed at [facility], [address] [place], [place] [address]    Culture (A)  Final    >=100,000 COLONIES/mL MULTIPLE SPECIES PRESENT, SUGGEST RECOLLECTION   Report Status 02/09/2021 FINAL  Final  MRSA PCR Screening     Status: None   Collection Time: 02/08/21  6:30 AM   Specimen: Nasopharyngeal  Result Value Ref Range Status   MRSA by PCR NEGATIVE NEGATIVE Final    Comment:        The GeneXpert MRSA Assay (FDA approved for NASAL specimens only), is one component of a comprehensive MRSA colonization surveillance program. It is  not intended to diagnose MRSA infection nor to guide or monitor treatment for MRSA infections. Performed at [facility], [address] [place], [place] [address]   Gastrointestinal Panel by PCR , Stool     Status: None   Collection Time: 02/10/21 12:36 PM   Specimen: Stool  Result Value Ref Range Status   Campylobacter species NOT DETECTED NOT DETECTED Final   Plesimonas shigelloides NOT DETECTED NOT DETECTED Final   Salmonella species NOT DETECTED NOT DETECTED Final   Yersinia enterocolitica NOT DETECTED NOT DETECTED Final   Vibrio species NOT DETECTED NOT DETECTED Final   Vibrio cholerae NOT DETECTED NOT DETECTED Final   Enteroaggregative E coli (EAEC) NOT DETECTED NOT DETECTED Final   Enteropathogenic E coli (EPEC) NOT DETECTED NOT DETECTED Final   Enterotoxigenic E coli (ETEC)  NOT DETECTED NOT DETECTED Final   Shiga like toxin producing E coli (STEC) NOT DETECTED NOT DETECTED Final   Shigella/Enteroinvasive E coli (EIEC) NOT DETECTED NOT DETECTED Final   Cryptosporidium NOT DETECTED NOT DETECTED Final   Cyclospora cayetanensis NOT DETECTED NOT DETECTED Final   Entamoeba histolytica NOT DETECTED NOT DETECTED Final   Giardia lamblia NOT DETECTED NOT DETECTED Final   Adenovirus F40/41 NOT DETECTED NOT DETECTED Final   Astrovirus NOT DETECTED NOT DETECTED Final   Norovirus GI/GII NOT DETECTED NOT DETECTED Final   Rotavirus A NOT DETECTED NOT DETECTED Final   Sapovirus (I, II, IV, and V) NOT DETECTED NOT DETECTED Final    Comment: Performed at [facility], [address] [place], [place] [address]  C Difficile Quick Screen (NO PCR Reflex)     Status: None   Collection Time: 02/10/21 12:36 PM   Specimen: STOOL  Result Value Ref Range Status   C Diff antigen NEGATIVE NEGATIVE Final   C Diff toxin NEGATIVE NEGATIVE Final   C Diff interpretation No C. difficile detected.  Final    Comment: Performed at [facility], [address]  [place], [place] [address]    Coagulation Studies: No results for input(s): LABPROT, INR in the last 72 hours.  Urinalysis: No results for input(s): COLORURINE, LABSPEC, PHURINE, GLUCOSEU, HGBUR, BILIRUBINUR, KETONESUR, PROTEINUR, UROBILINOGEN, NITRITE, LEUKOCYTESUR in the last 72 hours.  Invalid input(s): APPERANCEUR    Imaging: No results found.   Medications:   . sodium chloride Stopped (02/16/21 0524)  . lactated ringers 125 mL/hr at 02/17/21 0316  . norepinephrine (LEVOPHED) Adult infusion 2 mcg/min (02/17/21 0645)  . sodium chloride     . sodium chloride   Intravenous Once  . Chlorhexidine Gluconate Cloth  6 each Topical Daily  . levothyroxine  12.5 mcg Intravenous Daily  . midodrine  10 mg Oral TID WC  . psyllium  1 packet Oral Daily  . sodium bicarbonate  650 mg Oral TID   acetaminophen, oxyCODONE-acetaminophen  Assessment/ Plan:  William Holland is a 75 y.o. white male with hypertension, coronary artery disease, congestive heart failure, history of bowel perforation,  who was admitted to [facility] on 02/09/2021 for Cystitis [N30.90]  #.  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  CT abdomen 2/15- chronic cystitis with moderate bilateral hydronephrosis with bladder calculi -chronic. Appreciate urology input.   02/24 0701 - 02/25 0700 In: 3337.6 [P.O.:600; I.V.:2737.6] Out: 1450 [Stool:1450]   Lab Results  Component Value Date   CREATININE 2.87 (H) 02/17/2021   CREATININE 2.98 (H) 02/16/2021   CREATININE 2.96 (H) 02/15/2021  GFR 21 S Creatinine trend appears to be stabilizing   Continue supportive care and maintainence fluids   Check renal ultrasound- to r/o obstruction as UOP remains low/  #. Hypotension:   Now on midodrine   #. Proteinuria  UPC 3.68 K/l ratio 1.68  ANA negative  #. Chronic diarrhea GI eval  ongoing Segmental colitis associated with diverticulosis (SCAD) H/o colonoscopy 02/2018 C Diff neg  # Generalzied edema With large amount of 3rd spacing and tissue edema Consider iv albumin Decrease rate of iv fluid infusion to avoid overload     LOS: 10 William Holland 2/25/20229:02 AM

## 2021-02-17 NOTE — Progress Notes (Signed)
NAME:  William Holland, MRN:  [id number], DOB:  11-Sep-1946, LOS: 10 ADMISSION DATE:  02/13/2021, INITIAL CONSULTATION DATE:  02/20/2021 REFERRING MD:  Dr. Cherylann Banas, CHIEF COMPLAINT: Abdominal Pain & Syncope  Brief History:  75 yo male presenting to the ED in septic shock due to UTI requiring low dose pressors, admitted to ICU.  History of Present Illness:  75 yo male presented to the ED after a syncopal event with loss of consciousness. The patient's brother reported the patient was sitting, getting his hair cut when he fell over and became unresponsive for about 15 minutes. The patient denied experiencing any symptoms prior to falling over, no dizziness/lightheadness/prodromal symptoms. Only other complaint is mild pain to light palpation of abdomen which is chronically distended. Patient denies fever/chills/cough, does admit to diarrhea but states this is chronic. Patient denies that anything like this has ever happened to him before.   ED course: Initial vitals: hypothermic- 95.4/ NSR- 96/ RR- 13/ hypotensive- 71/52 (60). Labs significant for: leukocytosis-WBC 25.1, hyperkalemic 5.6, Cl 115, serum CO2 14, BUN/Cr 110/4.92, BNP 128.4, troponin 18~20, lactic 1.1, PCT 1.87  Past Medical History:  CAD NICM Diverticulosis Chronic combined systolic & diastolic HF  Significant Hospital Events:  02/04/2021 Admission with suspected UTI, low dose levophed 2/16: Renal US with moderate bilateral hydronephrosis, multiple large bladder calculi 2/17: Urology consulted;recommedns antibiotics and follow up cultures; recommends against aggressive interventions including ureteral stents or nephrostomy tubes 2/18: Weaning Levophed, currently at 3 mcg  Consults:  Nephrology Urology  Procedures:  N/A  Significant Diagnostic Tests:  02/08/2021 CT Abdomen pelvis >>  Findings of chronic cystitis with interval development of moderate bilateral hydronephrosis. There are a few small foci of air within the left renal  pelvis. Findings raise suspicion for ascending urinary tract infection. Correlate with urinalysis. Persistent severe diffuse colonic dilatation with transition point again noted within the mid sigmoid colon. Circumferentially thickened 6 cm segment of mid sigmoid colon containing numerous diverticula. Similar degree of mild adjacent fat stranding. Findings may represent stricture related to chronic diverticulitis. Underlying colonic neoplasm not excluded. Loss of fat plane between the bladder dome and thickened sigmoid colon raises the possibility of a colovesical fistula. 01/31/2021: CT Head>>1. No acute intracranial abnormality. 2/16: Renal US>>Moderate bilateral hydronephrosis is noted. Continued presence of multiple large bladder calculi.  Micro Data:  02/17/2021 COVID 19 >>negative 02/05/2021 BC x 2 >>no growth to date 2/15: Urine>>  >=100,000 COLONIES/mL MULTIPLE SPECIES PRESENT, SUGGEST RECOLLECTIONAbnormal  2/16: MRSA PCR>>negative 2/18: GI panel>> 2/18: C.diff>>  Antimicrobials:  Cefepime 2/15 >>2/17 Flagyl 2/15 >>2/17 Vancomycin x 1 dose 2/15 Zosyn 2/17>>  Interval History:  -Continues with liquid stools -Pt is awake and alert -Hemodynamically stable, afebrile -Weaning down Levophed (currenlty at 3 mcg); starting Midodrine -Bicarb gtt being discontinued due to metabolic alkalosis -Pt noted to have large volume of diarrhea in rectal tube;  GI panel and C. Diff negative  Objective   Blood pressure (!) 109/51, pulse 82, temperature 99.2 F (37.3 C), temperature source Oral, resp. rate 11, height 5' 6"  (1.676 m), weight 83.5 kg, SpO2 98 %.        Intake/Output Summary (Last 24 hours) at 02/17/2021 1436 Last data filed at 02/17/2021 0500 Gross per 24 hour  Intake 562.62 ml  Output 1450 ml  Net -887.38 ml   Filed Weights   02/12/21 0611 02/16/21 0500 02/17/21 0429  Weight: 79 kg 83.5 kg 83.5 kg    Examination: General: Acute on chronically ill appearing male,n NAD HEENT:  Atraumatic, normocephalic, neck supple, no JVD Pulm: Clear to auscultation bilaterally, even, nonlabored, normal effort CV: Regular rate and rhythm, s1s2, no M/R/G, 2+ distal pulses GI: Protuberant, no tenderness, no guarding or rebound tenderness, BS+ Skin: Warm and dry.  No obvious rashes, lesions, or ulcerations Extremities: No deformities, trace edema bilateral LE Neuro: Alert, oriented to person, place, and situation.  Follows commands, no focal deficits, speech clear, pupils PERRL Resolved Hospital Problem list   N/A  Assessment & Plan:  RESP: Stable CXR wihtout lobar infiltrate Room air  ID: Septic Shock in the setting of UTI HFrEF- LVEF 30-35% Hx: NICM, CAD -Remains low dose of levophed -Maintain MAP > 60 or SBP >100 -Continuous cardiac monitoring -Gentle IV fluids -Started midodrine, BP seems to be holding well over 60 -Home medications on hold in the setting of hypotension & AKI on CKD: metoprolol, losartan, torsemide. - TSH 9.773, trend from weekend -Cortisol 11 -Now off pressors and doing well -Will not accept transfusion but will take albumin for volume   Severe Sepsis Due to suspected UTI in the setting of chronic cystitis  Lactic: 1.1, Baseline PCT: 1.87, UA: had large leukocytes, multi-flora CXR: chronic without acute disease, CT Abdomen: concern for UTI Cefepime/ Vancomycin/ Metronidazole -Monitor fever curve -Trend mild leukocytosis and procalcitonin -Follow cultures as above -Continue IV Zosyn pending cultures and sensitiviites  RENAL: Lab Results  Component Value Date   CREATININE 2.87 (H) 02/17/2021   BUN 42 (H) 02/17/2021   NA 140 02/17/2021   K 5.0 02/17/2021   CL 116 (H) 02/17/2021   CO2 19 (L) 02/17/2021    Intake/Output Summary (Last 24 hours) at 02/17/2021 1436 Last data filed at 02/17/2021 0500 Gross per 24 hour  Intake 562.62 ml  Output 1450 ml  Net -887.38 ml    Net IO Since Admission: 14,022.4 mL [02/17/21 1436]  Acute Kidney  Injury on CKD Stage IV (In the setting of shock. Baseline creatinine 2.36-2.57, creatinine on admission 4.92) Bilateral Hydronephrosis Hypokalemia Metabolic Acidosis>>resolved Metabolic Alkalosis -Monitor I&O's / urinary output -Follow BMP -Ensure adequate renal perfusion -Avoid nephrotoxic agents as able -Replace electrolytes as indicated -Nephrology following, appreciate input -Bicarb gtt d/c by Nephrology due to metabolic alkalosis -Starting NS w/ KCL -Urology consulted, did not recommend any aggressive intervention with ureteral stents or nephrostomy tubes  GI: SCAD Appreciate GI eval recommendations Try psyllium and prednisone Chronic component of current issue likely largest problem KUB   HEME: Anemia -Monitor for S/Sx of bleeding -Trend CBC -Heparin SQ for VTE Prophylaxis  -[employer] Will accept albumin      Best practice (evaluated daily)  Diet: Heart healthy Pain/Anxiety/Delirium protocol (if indicated): N/a VAP protocol (if indicated): n/a DVT prophylaxis: heparin SQ GI prophylaxis: N/a Glucose control: monitor as needed Mobility: bedrest, mobilize as tolerated Disposition:ICU  Goals of Care:   Code Status: FULL  Disposition:    Med-tele, accepted by Medicine for 02-23-2021  Labs   CBC: Recent Labs  Lab 02/13/21 0319 02/14/21 0419 02/15/21 0651 02/16/21 0513 02/17/21 0412  WBC 11.0* 11.6* 8.9 8.2 9.2  HGB 7.3* 7.2* 7.1* 7.0* 6.5*  HCT 23.8* 22.9* 23.5* 22.7* 22.0*  MCV 96.4 95.0 97.5 97.0 99.5  PLT 211 210 203 205 710    Basic Metabolic Panel: Recent Labs  Lab 02/13/21 0319 02/14/21 0419 02/15/21 0651 02/16/21 0513 02/17/21 0412  NA 140 140 140 138 140  K 4.5 4.4 4.9 4.8 5.0  CL 114* 116* 116* 115* 116*  CO2 19* 18* 19*  18* 19*  GLUCOSE 99 89 89 91 98  BUN 53* 46* 45* 43* 42*  CREATININE 3.09* 2.88* 2.96* 2.98* 2.87*  CALCIUM 7.9* 8.2* 8.2* 8.1* 8.1*  MG 1.8 1.6* 2.5* 2.0 2.0  PHOS 3.0 3.2 3.4 3.7 3.7   GFR: Estimated  Creatinine Clearance: 22.9 mL/min (A) (by C-G formula based on SCr of 2.87 mg/dL (H)). Recent Labs  Lab 02/14/21 0419 02/15/21 0651 02/16/21 0513 02/17/21 0412  WBC 11.6* 8.9 8.2 9.2    Liver Function Tests: Recent Labs  Lab 02/11/21 0624  AST 13*  ALT 8  ALKPHOS 43  BILITOT 0.5  PROT 5.5*  ALBUMIN 2.1*   No results for input(s): LIPASE, AMYLASE in the last 168 hours. No results for input(s): AMMONIA in the last 168 hours.  ABG No results found for: PHART, PCO2ART, PO2ART, HCO3, TCO2, ACIDBASEDEF, O2SAT   Coagulation Profile: No results for input(s): INR, PROTIME in the last 168 hours.  Cardiac Enzymes: No results for input(s): CKTOTAL, CKMB, CKMBINDEX, TROPONINI in the last 168 hours.  HbA1C: Hgb A1c MFr Bld  Date/Time Value Ref Range Status  01/05/2019 04:37 PM 5.2 4.8 - 5.6 % Final    Comment:    (NOTE) Pre diabetes:          5.7%-6.4% Diabetes:              >6.4% Glycemic control for   <7.0% adults with diabetes     CBG: No results for input(s): GLUCAP in the last 168 hours.  Review of Systems: POSITIVES in BOLD  Gen: Denies fever, chills, weight change, fatigue, night sweats HEENT: Denies blurred vision, double vision, hearing loss, tinnitus, sinus congestion, rhinorrhea, sore throat, neck stiffness, dysphagia PULM: Denies shortness of breath, cough, sputum production, hemoptysis, wheezing CV: Denies chest pain, edema, orthopnea, paroxysmal nocturnal dyspnea, palpitations GI: Denies abdominal pain, nausea, vomiting, diarrhea, hematochezia, melena, constipation, change in bowel habits GU: Denies dysuria, hematuria, polyuria, oliguria, urethral discharge Endocrine: Denies hot or cold intolerance, polyuria, polyphagia or appetite change Derm: Denies rash, dry skin, scaling or peeling skin change Heme: Denies easy bruising, bleeding, bleeding gums Neuro: Denies headache, numbness, weakness, slurred speech, loss of memory or consciousness  Past Medical  History:  He,  has a past medical history of Anemia, Bowel perforation (HCC), Chronic combined systolic (congestive) and diastolic (congestive) heart failure (HCC), Chronic diarrhea, Diverticulosis, sigmoid (02/27/2018), NICM (nonischemic cardiomyopathy) ([place]), Nonobstructive CAD (coronary artery disease), and Stuttering during school years.   Surgical History:   Past Surgical History:  Procedure Laterality Date  . APPENDECTOMY    . COLONOSCOPY WITH PROPOFOL N/A 02/27/2018   Procedure: COLONOSCOPY WITH PROPOFOL;  Surgeon: Lin Landsman, MD;  Location: [facility] ENDOSCOPY;  Service: Gastroenterology;  Laterality: N/A;  . EYE SURGERY    . RIGHT/LEFT HEART CATH AND CORONARY ANGIOGRAPHY N/A 02/09/2019   Procedure: RIGHT/LEFT HEART CATH AND CORONARY ANGIOGRAPHY;  Surgeon: Wellington Hampshire, MD;  Location: [facility] LAB;  Service: Cardiovascular;  Laterality: N/A;     Social History:   reports that he has never smoked. He has never used smokeless tobacco. He reports that he does not drink alcohol and does not use drugs.   Family History:  His family history includes Cancer in his brother and father; Uterine cancer in his mother.   Allergies No Known Allergies   Home Medications  Prior to Admission medications   Medication Sig Start Date End Date Taking? Authorizing Provider  losartan (COZAAR) 25 MG tablet Take 12.5  mg by mouth daily. 07/17/20  Yes [provider]  metoprolol succinate (TOPROL XL) 25 MG 24 hr tablet Take 0.5 tablets (12.5 mg total) by mouth daily. 01/23/21  Yes Wellington Hampshire, MD  potassium chloride (KLOR-CON) 10 MEQ tablet Take 2 tablets (20 mEq total) by mouth daily as needed (when taking fluid pill). Patient taking differently: Take 20 mEq by mouth daily. 07/15/20  Yes Dunn, Areta Haber, PA-C  torsemide (DEMADEX) 20 MG tablet Take 1 tablet (20 mg total) each morning. Take additional dose (20 mg total) on Monday and Thursday afternoons Patient taking differently:  Take 20 mg by mouth daily. 09/01/20  Yes Rise Mu, PA-C     Level 3 follow-up     Masayuki Sakai

## 2021-02-17 NOTE — TOC Initial Note (Signed)
Transition of Care [facility]) - Initial/Assessment Note    Patient Details  Name: William Holland MRN: [id number] Date of Birth: April 01, 1946  Transition of Care [facility]) CM/SW Contact:    Shelbie Ammons, RN Phone Number: 02/17/2021, 4:12 PM  Clinical Narrative:  RNCM unable to speak with patient. RNCM reached out to patient's brother and POA for assessment. RNCM spoke with both brother and sister in law by phone. Discussed that at this time recommendation is for SNF at discharge and explained processes involved. Both brother and sister in law are agreeable to SNF work up and have no preferences as to initial facilities sent to.  RNCM completed PASSR, FL2 and started bed search.              Expected Discharge Plan: Skilled Nursing Facility Barriers to Discharge: Continued Medical Work up   Patient Goals and CMS Choice        Expected Discharge Plan and Services Expected Discharge Plan: [place] Acute Care Choice: [facility] Living arrangements for the past 2 months: Single Family Home                                      Prior Living Arrangements/Services Living arrangements for the past 2 months: Single Family Home Lives with:: Self Patient language and need for interpreter reviewed:: Yes Do you feel safe going back to the place where you live?: Yes      Need for Family Participation in Patient Care: Yes (Comment) Care giver support system in place?: Yes (comment)   Criminal Activity/Legal Involvement Pertinent to Current Situation/Hospitalization: No - Comment as needed  Activities of Daily Living Home Assistive Devices/Equipment: None ADL Screening (condition at time of admission) Patient's cognitive ability adequate to safely complete daily activities?: Yes Is the patient deaf or have difficulty hearing?: No Does the patient have difficulty seeing, even when wearing glasses/contacts?: No Does the patient have difficulty  concentrating, remembering, or making decisions?: Yes Patient able to express need for assistance with ADLs?: Yes Does the patient have difficulty dressing or bathing?: No Independently performs ADLs?: Yes (appropriate for developmental age) Does the patient have difficulty walking or climbing stairs?: No Weakness of Legs: None Weakness of Arms/Hands: None  Permission Sought/Granted                  Emotional Assessment         Alcohol / Substance Use: Not Applicable Psych Involvement: No (comment)  Admission diagnosis:  Cystitis [N30.90] Sepsis, due to unspecified organism, unspecified whether acute organ dysfunction present [facility]) [A41.9] Patient Active Problem List   Diagnosis Date Noted  . Severe sepsis with septic shock ([place]) 02/14/2021  . CAD (coronary artery disease) 02/01/2021  . Anemia in chronic kidney disease 01/25/2021  . Acute renal failure with tubular necrosis ([place]) 01/26/2021  . Syncope 02/16/2021  . Hydronephrosis, bilateral 02/04/2021  . Hyperkalemia 02/04/2021  . Elevated troponin 02/17/2021  . UTI (urinary tract infection) 02/11/2021  . Cystitis 02/17/2021  . Chronic combined systolic and diastolic CHF, NYHA class 2 ([place]) 02/05/2019  . Chronic systolic (congestive) heart failure ([place]) 01/23/2019  . Hypotension 01/23/2019  . Diverticulosis 01/23/2019  . Lymphedema 01/23/2019  . Hypokalemia   . Bowel perforation ([place])   . CHF exacerbation ([place]) 01/05/2019  . Pressure injury of skin 01/05/2019  . Chronic diarrhea of unknown origin   .  Loss of weight   . Abdominal pain 03/10/2016  . Colitis 03/10/2016   PCP:  Patient, No Pcp Per Pharmacy:   [facility] [address] ([address]), [place]) [place] [address] Phone: [phone] Fax: [phone]     Social Determinants of Health (SDOH) Interventions    Readmission Risk Interventions No flowsheet data found.

## 2021-02-18 ENCOUNTER — Inpatient Hospital Stay: Payer: Medicare Other

## 2021-02-18 DIAGNOSIS — R6521 Severe sepsis with septic shock: Secondary | ICD-10-CM | POA: Diagnosis not present

## 2021-02-18 DIAGNOSIS — A419 Sepsis, unspecified organism: Secondary | ICD-10-CM | POA: Diagnosis not present

## 2021-02-18 LAB — BPAM RBC
Blood Product Expiration Date: 202203232359
Unit Type and Rh: 6200

## 2021-02-18 LAB — RETICULOCYTES
Immature Retic Fract: 17.2 % — ABNORMAL HIGH (ref 2.3–15.9)
RBC.: 2.51 MIL/uL — ABNORMAL LOW (ref 4.22–5.81)
Retic Count, Absolute: 120.5 10*3/uL (ref 19.0–186.0)
Retic Ct Pct: 4.8 % — ABNORMAL HIGH (ref 0.4–3.1)

## 2021-02-18 LAB — COMPREHENSIVE METABOLIC PANEL
ALT: 13 U/L (ref 0–44)
AST: 15 U/L (ref 15–41)
Albumin: 3.5 g/dL (ref 3.5–5.0)
Alkaline Phosphatase: 60 U/L (ref 38–126)
Anion gap: 9 (ref 5–15)
BUN: 41 mg/dL — ABNORMAL HIGH (ref 8–23)
CO2: 19 mmol/L — ABNORMAL LOW (ref 22–32)
Calcium: 9.1 mg/dL (ref 8.9–10.3)
Chloride: 112 mmol/L — ABNORMAL HIGH (ref 98–111)
Creatinine, Ser: 2.63 mg/dL — ABNORMAL HIGH (ref 0.61–1.24)
GFR, Estimated: 25 mL/min — ABNORMAL LOW (ref >60)
Glucose, Bld: 133 mg/dL — ABNORMAL HIGH (ref 70–99)
Potassium: 4.9 mmol/L (ref 3.5–5.1)
Sodium: 140 mmol/L (ref 135–145)
Total Bilirubin: 1 mg/dL (ref 0.3–1.2)
Total Protein: 7.8 g/dL (ref 6.5–8.1)

## 2021-02-18 LAB — BASIC METABOLIC PANEL
Anion gap: 5 (ref 5–15)
BUN: 38 mg/dL — ABNORMAL HIGH (ref 8–23)
CO2: 21 mmol/L — ABNORMAL LOW (ref 22–32)
Calcium: 8.5 mg/dL — ABNORMAL LOW (ref 8.9–10.3)
Chloride: 117 mmol/L — ABNORMAL HIGH (ref 98–111)
Creatinine, Ser: 2.6 mg/dL — ABNORMAL HIGH (ref 0.61–1.24)
GFR, Estimated: 25 mL/min — ABNORMAL LOW (ref >60)
Glucose, Bld: 87 mg/dL (ref 70–99)
Potassium: 4.9 mmol/L (ref 3.5–5.1)
Sodium: 143 mmol/L (ref 135–145)

## 2021-02-18 LAB — IRON AND TIBC
Iron: 27 ug/dL — ABNORMAL LOW (ref 45–182)
Saturation Ratios: 11 % — ABNORMAL LOW (ref 17.9–39.5)
TIBC: 242 ug/dL — ABNORMAL LOW (ref 250–450)
UIBC: 215 ug/dL

## 2021-02-18 LAB — CBC
HCT: 23.9 % — ABNORMAL LOW (ref 39.0–52.0)
HCT: 28 % — ABNORMAL LOW (ref 39.0–52.0)
Hemoglobin: 7.1 g/dL — ABNORMAL LOW (ref 13.0–17.0)
Hemoglobin: 8.6 g/dL — ABNORMAL LOW (ref 13.0–17.0)
MCH: 29.3 pg (ref 26.0–34.0)
MCH: 30.3 pg (ref 26.0–34.0)
MCHC: 29.7 g/dL — ABNORMAL LOW (ref 30.0–36.0)
MCHC: 30.7 g/dL (ref 30.0–36.0)
MCV: 98.8 fL (ref 80.0–100.0)
MCV: 98.6 fL (ref 80.0–100.0)
Platelets: 226 10*3/uL (ref 150–400)
Platelets: 283 10*3/uL (ref 150–400)
RBC: 2.42 MIL/uL — ABNORMAL LOW (ref 4.22–5.81)
RBC: 2.84 MIL/uL — ABNORMAL LOW (ref 4.22–5.81)
RDW: 15.7 % — ABNORMAL HIGH (ref 11.5–15.5)
RDW: 15.9 % — ABNORMAL HIGH (ref 11.5–15.5)
WBC: 9.6 10*3/uL (ref 4.0–10.5)
WBC: 20.4 10*3/uL — ABNORMAL HIGH (ref 4.0–10.5)
nRBC: 0 % (ref 0.0–0.2)
nRBC: 0 % (ref 0.0–0.2)

## 2021-02-18 LAB — TYPE AND SCREEN
ABO/RH(D): A POS
Antibody Screen: NEGATIVE
Unit division: 0

## 2021-02-18 LAB — PHOSPHORUS: Phosphorus: 3.6 mg/dL (ref 2.5–4.6)

## 2021-02-18 LAB — LACTIC ACID, PLASMA: Lactic Acid, Venous: 1.1 mmol/L (ref 0.5–1.9)

## 2021-02-18 LAB — VITAMIN B12: Vitamin B-12: 396 pg/mL (ref 180–914)

## 2021-02-18 LAB — FOLATE: Folate: 9.6 ng/mL (ref >5.9)

## 2021-02-18 LAB — MAGNESIUM: Magnesium: 1.9 mg/dL (ref 1.7–2.4)

## 2021-02-18 LAB — BRAIN NATRIURETIC PEPTIDE: B Natriuretic Peptide: 920.2 pg/mL — ABNORMAL HIGH (ref 0.0–100.0)

## 2021-02-18 LAB — PREPARE RBC (CROSSMATCH)

## 2021-02-18 LAB — D-DIMER, QUANTITATIVE: D-Dimer, Quant: 13 ug/mL-FEU — ABNORMAL HIGH (ref 0.00–0.50)

## 2021-02-18 LAB — FERRITIN: Ferritin: 94 ng/mL (ref 24–336)

## 2021-02-18 MED ORDER — ALUM & MAG HYDROXIDE-SIMETH 200-200-20 MG/5ML PO SUSP: 30.0000 mL | Freq: Once | ORAL | Status: DC

## 2021-02-18 MED ORDER — PANTOPRAZOLE SODIUM 40 MG IV SOLR
40.0000 mg | Freq: Two times a day (BID) | INTRAVENOUS | Status: DC
  Administered 2021-02-18: 40 mg via INTRAVENOUS
  Filled 2021-02-18: qty 40

## 2021-02-18 MED ORDER — LACTULOSE ENEMA
300.0000 mL | Freq: Once | ORAL | Status: DC
  Filled 2021-02-18: qty 300

## 2021-02-18 MED ORDER — LACTULOSE 10 GM/15ML PO SOLN: 10.0000 g | Freq: Every day | ORAL | Status: DC

## 2021-02-18 MED ORDER — ONDANSETRON HCL 4 MG/2ML IJ SOLN
INTRAMUSCULAR | Status: AC
  Administered 2021-02-18: 4 mg via INTRAVENOUS
  Filled 2021-02-18: qty 2

## 2021-02-18 MED ORDER — ONDANSETRON HCL 4 MG/2ML IJ SOLN
4.0000 mg | Freq: Four times a day (QID) | INTRAMUSCULAR | Status: DC | PRN
  Administered 2021-02-18: 4 mg via INTRAVENOUS
  Filled 2021-02-18: qty 2

## 2021-02-18 MED ORDER — HYDROMORPHONE HCL 1 MG/ML IJ SOLN
0.5000 mg | INTRAMUSCULAR | Status: DC | PRN
  Administered 2021-02-18: 0.5 mg via INTRAVENOUS
  Filled 2021-02-18: qty 1

## 2021-02-18 MED ORDER — PIPERACILLIN-TAZOBACTAM 3.375 G IVPB
3.3750 g | Freq: Three times a day (TID) | INTRAVENOUS | Status: DC
  Administered 2021-02-18: 3.375 g via INTRAVENOUS
  Filled 2021-02-18: qty 50

## 2021-02-18 MED ORDER — PROMETHAZINE HCL 25 MG/ML IJ SOLN
12.5000 mg | Freq: Four times a day (QID) | INTRAMUSCULAR | Status: DC | PRN
  Administered 2021-02-18: 12.5 mg via INTRAVENOUS
  Filled 2021-02-18: qty 1

## 2021-02-18 MED ORDER — LOPERAMIDE HCL 2 MG PO CAPS: 2.0000 mg | ORAL_CAPSULE | ORAL | Status: DC | PRN

## 2021-02-18 MED ORDER — IOHEXOL 9 MG/ML PO SOLN
500.0000 mL | ORAL | Status: AC
  Administered 2021-02-18 (×2): 500 mL via ORAL

## 2021-02-18 MED ORDER — PANTOPRAZOLE SODIUM 40 MG PO TBEC: 40.0000 mg | DELAYED_RELEASE_TABLET | Freq: Every day | ORAL | Status: DC

## 2021-02-18 MED ORDER — SODIUM CHLORIDE 0.9 % IV SOLN
510.0000 mg | Freq: Once | INTRAVENOUS | Status: AC
  Administered 2021-02-18: 510 mg via INTRAVENOUS
  Filled 2021-02-18: qty 17

## 2021-02-18 MED ORDER — LIDOCAINE VISCOUS HCL 2 % MT SOLN: 15.0000 mL | Freq: Once | OROMUCOSAL | Status: DC

## 2021-02-19 NOTE — Progress Notes (Signed)
Called [facility].Spoke to [employer].Donates was denied. Ref #[id number].Cleaned and sent to [facility].

## 2021-02-21 NOTE — Consult Note (Signed)
[facility] for Electrolyte Monitoring and Replacement   Recent Labs: Potassium (mmol/L)  Date Value  03-10-21 4.9   Magnesium (mg/dL)  Date Value  03-10-2021 1.9   Calcium (mg/dL)  Date Value  2021/03/10 8.5 (L)   Albumin (g/dL)  Date Value  02/11/2021 2.1 (L)  06/29/2020 4.3   Phosphorus (mg/dL)  Date Value  March 10, 2021 3.6   Sodium (mmol/L)  Date Value  Mar 10, 2021 143  06/29/2020 143   Corrected Ca: 9.6 mg/dL  Assessment: Pt is a 75 yo presented to the ED on 2/15 due to a syncopal event with loss of consciousness. Suspected sepsis with septic shock due to suspected UTI in the setting of chronic cystitis. His SCr is stabilizinng  Pharmacy has been consulted to monitor and replenish electrolytes.   On Na bicarb 650 mg TID  Goal of Therapy:  Electrolytes WNL  Plan:   no electrolyte replacement warranted for today  Will follow with am labs  Oswald Hillock, PharmD, BCPS Clinical Pharmacist 03-10-21 7:43 AM

## 2021-02-21 NOTE — Progress Notes (Signed)
OT Cancellation Note  Patient Details Name: William Holland MRN: [id number] DOB: 23-Oct-1946   Cancelled Treatment:    Reason Eval/Treat Not Completed: Other (comment)  OT consult received and chart reviewed. Pt nauseated and having bouts of vomiting at this time. RN reports she has already given him medication, but he is still actively throwing up at this time. Will f/u for OT evaluation at later date/time as able. Thank you.  Gerrianne Scale, [place], OTR/L ascom [phone] Feb 27, 2021, 4:01 PM

## 2021-02-21 NOTE — Progress Notes (Signed)
[employer] Recent findings of patient with distal colonic obstruction now tachycardic, tachypnic and new oxygen requirement of 5L/[place] VS T97.9; BP 113/83 (89); pulse 117; RR 25 BBS diminish bases, faint crackles.  Abdomen - firm, distended, high pitched bowel sounds all quadrant Patient endorses abdominal pain and nausea Prior KUB approx 1730 FINDINGS: Worsening colonic dilation of up to 13 cm. Formed stool throughout the colon and rectum. Paucity of small bowel gas. Findings consistent with distal colonic obstruction.  IMPRESSION: Worsening colonic dilation with paucity of small bowel gas, consistent with distal colonic obstruction.  A lactulose enema previously ordered not yet administered by nurse secondary to his respiratory distress  CXR - stat FINDINGS: The cardiomediastinal silhouette is unchanged in contour. No pleural effusion. No pneumothorax. Increased bibasilar predominately platelike consolidative opacities. Multilevel degenerative changes of the thoracic spine. There is marked gaseous dilation of colon beneath the diaphragms.  IMPRESSION: 1. Increased bibasilar consolidative opacities most consistent with atelectasis. Superimposed infection could present similarly. 2. Marked gaseous dilation of colon beneath the diaphragms.   WBC 2. 4, HGB without significant change D Dimer 13 - Creatinine 2.63 No room for IV contrast to r/o PE but elevation may be related to inflammatory response He is with criteria for sepsis with likely abdominal source given the appearance of acute abdomen.   NGT placed and zosyn started Lactic only 1.1  Remain tachy below 120 and stable blood pressure.   KUB for NGT placement FINDINGS: Nasogastric tube tip is seen in expected position of distal stomach. There is again noted colonic dilatation with extensive stool burden. No definite small bowel dilatation is noted.  IMPRESSION: Nasogastric tube tip seen in expected position of  distal stomach. Stable colonic dilatation with extensive stool burden. No definite small bowel dilatation is noted.  CT abdomen/pelvis with oral contrast attempted to further evaluate obstruction  Nursing reported patient had vomiting and had acute desaturation with transfer to CT table and went to PEA Patient DNR.  Exam in CT scan, patient without puls or cardiac sounds. No neuro response, few agonal respirations noted during exam.  Time of death pronounced at 2319 on 2021-03-15

## 2021-02-21 NOTE — Death Summary Note (Signed)
Death Summary  William Holland GGE:[id number] DOB: [birth year] DOA: Feb 24, 2021  PCP: Patient, No Pcp Per  Admit date: 02-24-21 Date of Death: Mar 08, 2021 Time of Death: 23:19 Notification: Patient, No Pcp Per notified of death of 2021-03-08   History of present illness:  William Holland is a 75 y.o. male with a history of CAD, nonischemic cardiomyopathy, diverticulosis, combined systolic and diastolic heart failure, chronic diarrhea and chronic colonic stricture. William Holland presented after a syncopal event with loss of consciousness.  after a syncopal event with loss of consciousness.  Admitted for septic shock secondary to UTI, required Levophed for many days, now blood pressure maintained on midodrine.  Low-dose Levophed was discontinued on 02/17/2021.  Later blood pressure was maintained with midodrine.  CT abdomen with bilateral hydronephrosis, chronic cystitis and multiple bladder calculi, along with dilated colonic wall with a transition point.  Multiple specialities were consulted and they recommended conservative management.  He was not a candidate for any surgical intervention. He completed the course of antibiotics.  Clinically seemed improving and transferred out of ICU on the day of death.  He developed worsening abdominal pain later in the day, KUB with worsening colonic dilatation, conservative management was decided, NG was placed.  At night due to worsening abdominal pain decided to repeat CT abdomen, patient coded while going for CT, took couple of agonal breath and died.  He was DNR. Patient also had hemoglobin of 6.5.  Offered blood transfusion which he declined due to being Jehovah's Witness.  Final Diagnoses:  1.   Septic shock   The results of significant diagnostics from this hospitalization (including imaging, microbiology, ancillary and laboratory) are listed below for reference.    Significant Diagnostic Studies: CT ABDOMEN PELVIS WO CONTRAST  Result Date: February 24, 2021 CLINICAL  DATA:  Abdominal pain, fever EXAM: CT ABDOMEN AND PELVIS WITHOUT CONTRAST TECHNIQUE: Multidetector CT imaging of the abdomen and pelvis was performed following the standard protocol without IV contrast. COMPARISON:  CT 06/14/2020, 01/09/2019 FINDINGS: Lower chest: Included lung bases are clear.  Heart size is normal. Hepatobiliary: Significant mass effect upon the liver from markedly distended colon. No focal liver abnormality is seen. No gallstones, gallbladder wall thickening, or biliary dilatation. Pancreas: Unremarkable. No pancreatic ductal dilatation or surrounding inflammatory changes. Spleen: Normal in size without focal abnormality. Adrenals/Urinary Tract: Adrenal glands within normal limits. Multiple large stones within the bladder are again noted. Multiple bladder diverticula. There is a small amount of air within the bladder lumen, most pronounced anteriorly. Chronic urinary bladder wall thickening. Interval development of moderate bilateral hydronephrosis. There are a few small foci of air within the left renal pelvis. Stable exophytic left renal cyst. Stomach/Bowel: Severe diffuse colonic dilatation with transition point again noted within the mid sigmoid colon (series 2, image 69). Circumferentially thickened 6 cm segment of mid sigmoid colon at the level of transition containing numerous diverticula. Similar degree of mild adjacent fat stranding. Loss of fat plane between the bladder dome and thickened sigmoid colon raises the possibility of a colovesical fistula, although no well-defined fistulous track is evident. No dilated loops of small bowel. Stomach within normal limits. Vascular/Lymphatic: Scattered aortoiliac atherosclerotic calcifications without aneurysm. No abdominopelvic lymphadenopathy. Reproductive: Similar degree of mild prostatomegaly. Other: No ascites.  No pneumoperitoneum. Musculoskeletal: No acute or significant osseous findings. IMPRESSION: 1. Findings of chronic cystitis with  interval development of moderate bilateral hydronephrosis. There are a few small foci of air within the left renal pelvis. Findings raise suspicion for ascending urinary tract infection. Correlate  with urinalysis. 2. Persistent severe diffuse colonic dilatation with transition point again noted within the mid sigmoid colon. Circumferentially thickened 6 cm segment of mid sigmoid colon containing numerous diverticula. Similar degree of mild adjacent fat stranding. Findings may represent stricture related to chronic diverticulitis. Underlying colonic neoplasm not excluded. 3. Loss of fat plane between the bladder dome and thickened sigmoid colon raises the possibility of a colovesical fistula. Aortic Atherosclerosis (ICD10-I70.0). Electronically Signed   By: Davina Poke D.O.   On: 01/29/2021 11:12   DG Abd 1 View  Result Date: 2021/02/22 CLINICAL DATA:  Nasogastric tube placement. EXAM: ABDOMEN - 1 VIEW COMPARISON:  Same day. FINDINGS: Nasogastric tube tip is seen in expected position of distal stomach. There is again noted colonic dilatation with extensive stool burden. No definite small bowel dilatation is noted. IMPRESSION: Nasogastric tube tip seen in expected position of distal stomach. Stable colonic dilatation with extensive stool burden. No definite small bowel dilatation is noted. Electronically Signed   By: Marijo Conception M.D.   On: Feb 22, 2021 21:38   DG Abd 1 View  Result Date: 02-22-21 CLINICAL DATA:  Abdominal distension and vomiting. EXAM: ABDOMEN - 1 VIEW COMPARISON:  February 13, 2021 FINDINGS: Worsening colonic dilation of up to 13 cm. Formed stool throughout the colon and rectum. Paucity of small bowel gas. Findings consistent with distal colonic obstruction. IMPRESSION: Worsening colonic dilation with paucity of small bowel gas, consistent with distal colonic obstruction. Electronically Signed   By: Fidela Salisbury M.D.   On: 2021/02/22 17:38   DG Abd 1 View  Result Date:  02/13/2021 CLINICAL DATA:  Abdominal distension. EXAM: ABDOMEN - 1 VIEW COMPARISON:  July 18, 2018 FINDINGS: Distended air-filled loops of large bowel are again seen. This is unchanged in appearance when compared to the prior study. Multiple stable calcifications are seen overlying the expected region of the urinary bladder. IMPRESSION: 1. Persistently distended air-filled large bowel, unchanged in appearance when compared to the prior study. While this may be, in part, chronic in nature, sequelae associated with a distal colonic obstruction cannot be excluded. 2. Urinary bladder calculi. Electronically Signed   By: Virgina Norfolk M.D.   On: 02/13/2021 20:04   CT HEAD WO CONTRAST  Result Date: 02/03/2021 CLINICAL DATA:  Mental status change, unknown cause. EXAM: CT HEAD WITHOUT CONTRAST TECHNIQUE: Contiguous axial images were obtained from the base of the skull through the vertex without intravenous contrast. COMPARISON:  Head CT January 05, 2019 FINDINGS: Brain: No evidence of acute infarction, hemorrhage, hydrocephalus, extra-axial collection or mass lesion/mass effect. Vascular: No hyperdense vessel. Atherosclerotic calcifications of the internal carotid and vertebral arteries. Skull: Normal. Negative for fracture or focal lesion. Sinuses/Orbits: Scattered with mucosal thickening of the posterior ethmoid air cells. Other: Similar appearance of the partially visualized 10 mm subcutaneous nodule in the posterior upper neck just off midline, likely a sebaceous cyst but incompletely evaluated. IMPRESSION: 1. No acute intracranial abnormality. Electronically Signed   By: Dahlia Bailiff MD   On: 02/11/2021 09:57   US RENAL  Result Date: 02/17/2021 CLINICAL DATA:  Acute renal failure, history CHF, non ischemic cardiomyopathy, coronary artery disease EXAM: RENAL / URINARY TRACT ULTRASOUND COMPLETE COMPARISON:  02/08/2021 FINDINGS: Right Kidney: Renal measurements: 8.5 x 4.0 x 4.6 cm = volume: 81 mL. Poorly  visualized due to body habitus. Cortical thinning. No gross mass or hydronephrosis identified on limited assessment. Left Kidney: Renal measurements: 11.8 x 4.7 x 4.8 cm = volume: 138 mL. Poorly visualized due  to body habitus. Grossly normal appearance without mass or hydronephrosis Bladder: Not visualized, decompressed by Foley catheter Other: N/A IMPRESSION: Limited exam secondary to body habitus. No gross renal sonographic abnormality seen on limited assessment. Electronically Signed   By: Lavonia Dana M.D.   On: 02/17/2021 11:56   US RENAL  Result Date: 02/08/2021 CLINICAL DATA:  Chronic kidney disease stage 4. EXAM: RENAL / URINARY TRACT ULTRASOUND COMPLETE COMPARISON:  May 26, 2020.  February 07, 2021. FINDINGS: Right Kidney: Renal measurements: 10.3 x 6.1 x 4.4 cm = volume: 146 mL. Echogenicity within normal limits. No mass visualized. Moderate hydronephrosis is noted. 8 mm cyst is seen in midpole. Left Kidney: Renal measurements: 12.0 x 5.3 x 4.1 cm = volume: 137 mL. Echogenicity within normal limits. No mass visualized. Moderate hydronephrosis is noted. 4 cm simple cyst is seen in midpole. Bladder: Multiple large bladder calculi are again noted. Calculated prevoid volume of 10 mL is noted. Patient was unable to void. Other: None. IMPRESSION: Moderate bilateral hydronephrosis is noted. Continued presence of multiple large bladder calculi. Electronically Signed   By: Marijo Conception M.D.   On: 02/08/2021 15:36   US Venous Img Lower Bilateral (DVT)  Result Date: 02/17/2021 CLINICAL DATA:  Bilateral lower extremity edema. EXAM: BILATERAL LOWER EXTREMITY VENOUS DOPPLER ULTRASOUND TECHNIQUE: Gray-scale sonography with graded compression, as well as color Doppler and duplex ultrasound were performed to evaluate the lower extremity deep venous systems from the level of the common femoral vein and including the common femoral, femoral, profunda femoral, popliteal and calf veins including the posterior tibial,  peroneal and gastrocnemius veins when visible. The superficial great saphenous vein was also interrogated. Spectral Doppler was utilized to evaluate flow at rest and with distal augmentation maneuvers in the common femoral, femoral and popliteal veins. COMPARISON:  None. FINDINGS: RIGHT LOWER EXTREMITY Common Femoral Vein: No evidence of thrombus. Normal compressibility, respiratory phasicity and response to augmentation. Saphenofemoral Junction: No evidence of thrombus. Normal compressibility and flow on color Doppler imaging. Profunda Femoral Vein: No evidence of thrombus. Normal compressibility and flow on color Doppler imaging. Femoral Vein: No evidence of thrombus. Normal compressibility, respiratory phasicity and response to augmentation. Popliteal Vein: No evidence of thrombus. Normal compressibility, respiratory phasicity and response to augmentation. Calf Veins: No evidence of thrombus. Normal compressibility and flow on color Doppler imaging. Superficial Great Saphenous Vein: No evidence of thrombus. Normal compressibility. Venous Reflux:  None. Other Findings: No evidence of superficial thrombophlebitis or abnormal fluid collection. LEFT LOWER EXTREMITY Common Femoral Vein: No evidence of thrombus. Normal compressibility, respiratory phasicity and response to augmentation. Saphenofemoral Junction: No evidence of thrombus. Normal compressibility and flow on color Doppler imaging. Profunda Femoral Vein: No evidence of thrombus. Normal compressibility and flow on color Doppler imaging. Femoral Vein: No evidence of thrombus. Normal compressibility, respiratory phasicity and response to augmentation. Popliteal Vein: No evidence of thrombus. Normal compressibility, respiratory phasicity and response to augmentation. Calf Veins: No evidence of thrombus. Normal compressibility and flow on color Doppler imaging. Superficial Great Saphenous Vein: No evidence of thrombus. Normal compressibility. Venous Reflux:   None. Other Findings: No evidence of superficial thrombophlebitis or abnormal fluid collection. IMPRESSION: No evidence of deep venous thrombosis in either lower extremity. Electronically Signed   By: Aletta Edouard M.D.   On: 02/17/2021 09:32   DG CHEST PORT 1 VIEW  Result Date: 03-03-21 CLINICAL DATA:  Tachypnea EXAM: PORTABLE CHEST 1 VIEW COMPARISON:  February 09, 2021, same day abdominal radiograph FINDINGS: The cardiomediastinal silhouette  is unchanged in contour. No pleural effusion. No pneumothorax. Increased bibasilar predominately platelike consolidative opacities. Multilevel degenerative changes of the thoracic spine. There is marked gaseous dilation of colon beneath the diaphragms. IMPRESSION: 1. Increased bibasilar consolidative opacities most consistent with atelectasis. Superimposed infection could present similarly. 2. Marked gaseous dilation of colon beneath the diaphragms. Electronically Signed   By: Valentino Saxon MD   On: 2021/03/06 20:09   DG Chest Port 1 View  Result Date: 02/09/2021 CLINICAL DATA:  Abnormal x-ray. EXAM: PORTABLE CHEST 1 VIEW COMPARISON:  Two days ago FINDINGS: Very gas dilated colon in the upper abdomen. Low volume lungs. There is no edema, consolidation, effusion, or pneumothorax. Normal heart size and mediastinal contours. IMPRESSION: 1. Negative low volume chest. 2. Persistent gas distended colon as evaluated by recent abdominal CT. Electronically Signed   By: Monte Fantasia M.D.   On: 02/09/2021 11:48   DG Chest Portable 1 View  Result Date: 02/20/2021 CLINICAL DATA:  Sepsis. EXAM: PORTABLE CHEST 1 VIEW COMPARISON:  01/05/2019 FINDINGS: 1025 hours. Low volume film. Interstitial markings are diffusely coarsened with chronic features. Insert upper no The lungs are clear without focal pneumonia, edema, pneumothorax or pleural effusion. Marked gaseous distention of colon identified under both hemidiaphragms, similar to prior. IMPRESSION: 1. Chronic  interstitial coarsening without acute cardiopulmonary findings. 2. Marked gaseous distention of the colon under both hemidiaphragms. Electronically Signed   By: Misty Stanley M.D.   On: 02/12/2021 10:46    Microbiology: Recent Results (from the past 240 hour(s))  Gastrointestinal Panel by PCR , Stool     Status: None   Collection Time: 02/10/21 12:36 PM   Specimen: Stool  Result Value Ref Range Status   Campylobacter species NOT DETECTED NOT DETECTED Final   Plesimonas shigelloides NOT DETECTED NOT DETECTED Final   Salmonella species NOT DETECTED NOT DETECTED Final   Yersinia enterocolitica NOT DETECTED NOT DETECTED Final   Vibrio species NOT DETECTED NOT DETECTED Final   Vibrio cholerae NOT DETECTED NOT DETECTED Final   Enteroaggregative E coli (EAEC) NOT DETECTED NOT DETECTED Final   Enteropathogenic E coli (EPEC) NOT DETECTED NOT DETECTED Final   Enterotoxigenic E coli (ETEC) NOT DETECTED NOT DETECTED Final   Shiga like toxin producing E coli (STEC) NOT DETECTED NOT DETECTED Final   Shigella/Enteroinvasive E coli (EIEC) NOT DETECTED NOT DETECTED Final   Cryptosporidium NOT DETECTED NOT DETECTED Final   Cyclospora cayetanensis NOT DETECTED NOT DETECTED Final   Entamoeba histolytica NOT DETECTED NOT DETECTED Final   Giardia lamblia NOT DETECTED NOT DETECTED Final   Adenovirus F40/41 NOT DETECTED NOT DETECTED Final   Astrovirus NOT DETECTED NOT DETECTED Final   Norovirus GI/GII NOT DETECTED NOT DETECTED Final   Rotavirus A NOT DETECTED NOT DETECTED Final   Sapovirus (I, II, IV, and V) NOT DETECTED NOT DETECTED Final    Comment: Performed at [facility], [address] [place], [place] [address]  C Difficile Quick Screen (NO PCR Reflex)     Status: None   Collection Time: 02/10/21 12:36 PM   Specimen: STOOL  Result Value Ref Range Status   C Diff antigen NEGATIVE NEGATIVE Final   C Diff toxin NEGATIVE NEGATIVE Final   C Diff interpretation No C. difficile detected.   Final    Comment: Performed at [facility], [address] [place],  [address]     Labs: Basic Metabolic Panel: Recent Labs  Lab 02/14/21 [id number] 02/15/21 4097 02/16/21 3532 02/17/21 9924 03/06/2021 0413  02/21/21 1958  NA 140 140 138 140 143 140  K 4.4 4.9 4.8 5.0 4.9 4.9  CL 116* 116* 115* 116* 117* 112*  CO2 18* 19* 18* 19* 21* 19*  GLUCOSE 89 89 91 98 87 133*  BUN 46* 45* 43* 42* 38* 41*  CREATININE 2.88* 2.96* 2.98* 2.87* 2.60* 2.63*  CALCIUM 8.2* 8.2* 8.1* 8.1* 8.5* 9.1  MG 1.6* 2.5* 2.0 2.0 1.9  --   PHOS 3.2 3.4 3.7 3.7 3.6  --    Liver Function Tests: Recent Labs  Lab 02/21/21 1958  AST 15  ALT 13  ALKPHOS 60  BILITOT 1.0  PROT 7.8  ALBUMIN 3.5   No results for input(s): LIPASE, AMYLASE in the last 168 hours. No results for input(s): AMMONIA in the last 168 hours. CBC: Recent Labs  Lab 02/15/21 0651 02/16/21 0513 02/17/21 0412 21-Feb-2021 0854 02/21/2021 1958  WBC 8.9 8.2 9.2 9.6 20.4*  HGB 7.1* 7.0* 6.5* 7.1* 8.6*  HCT 23.5* 22.7* 22.0* 23.9* 28.0*  MCV 97.5 97.0 99.5 98.8 98.6  PLT 203 205 204 226 283   Cardiac Enzymes: No results for input(s): CKTOTAL, CKMB, CKMBINDEX, TROPONINI in the last 168 hours. D-Dimer Recent Labs    02/21/21 1958  DDIMER 13.00*   BNP: Invalid input(s): POCBNP CBG: No results for input(s): GLUCAP in the last 168 hours. Anemia work up Recent Labs    2021-02-21 1016  VITAMINB12 396  FOLATE 9.6  FERRITIN 94  TIBC 242*  IRON 27*  RETICCTPCT 4.8*   Urinalysis    Component Value Date/Time   COLORURINE YELLOW (A) 02/14/2021 1616   APPEARANCEUR TURBID (A) 02/06/2021 1616   LABSPEC 1.010 02/13/2021 1616   PHURINE 7.0 02/15/2021 1616   GLUCOSEU NEGATIVE 02/09/2021 1616   HGBUR MODERATE (A) 01/29/2021 1616   BILIRUBINUR NEGATIVE 02/02/2021 1616   KETONESUR NEGATIVE 02/09/2021 1616   PROTEINUR 100 (A) 01/29/2021 1616   NITRITE NEGATIVE 02/11/2021 1616   LEUKOCYTESUR LARGE (A) 02/05/2021 1616    Sepsis Labs Invalid input(s): PROCALCITONIN,  WBC,  LACTICIDVEN     SIGNED:  Lorella Nimrod, MD  [facility] 02/14/2021, 7:51 AM Pager   If 7PM-7AM, please contact night-coverage [email or web address] Password [email or web address]  This record has been created using Systems analyst. Errors have been sought and corrected,but may not always be located. Such creation errors do not reflect on the standard of care.

## 2021-02-21 NOTE — Progress Notes (Signed)
Pharmacy Antibiotic Note  William Holland is a 75 y.o. male admitted on 01/29/2021 with sepsis.  Pharmacy has been consulted for Zosyn dosing.  Plan: Zosyn 3.375g IV q8h (4 hour infusion).  Height: 5' 6"  (167.6 cm) Weight: 83.5 kg (184 lb 1.4 oz) IBW/kg (Calculated) : 63.8  Temp (24hrs), Avg:97.9 F (36.6 C), Min:97.7 F (36.5 C), Max:98.1 F (36.7 C)  Recent Labs  Lab 02/15/21 0651 02/16/21 0513 02/17/21 0412 03/18/2021 0413 March 18, 2021 0854 Mar 18, 2021 1958  WBC 8.9 8.2 9.2  --  9.6 20.4*  CREATININE 2.96* 2.98* 2.87* 2.60*  --  2.63*    Estimated Creatinine Clearance: 25 mL/min (A) (by C-G formula based on SCr of 2.63 mg/dL (H)).    No Known Allergies  Antimicrobials this admission: Zosyn 2/26 >>   Dose adjustments this admission:  Microbiology results:  Thank you for allowing pharmacy to be a part of this patients care.  Paulina Fusi, PharmD, BCPS 2021-03-18 9:01 PM

## 2021-02-21 NOTE — Progress Notes (Signed)
Patient transported to CT scan, on transfer to CT table patient dropped oxygen saturation to 77 then 45% and became unresponsive.  Patient vomiting and then agoanal. Sharion Settler NP notified and at bedside in CT scan. Patient pronounced at 03/19/2318

## 2021-02-21 NOTE — Progress Notes (Addendum)
PROGRESS NOTE ICU transfer.   William Holland  JJH:[id number] DOB: 17-Dec-1946 DOA: 01/29/2021 PCP: Patient, No Pcp Per   Brief Narrative: Taken from prior notes. 75 yo male presented to the ED after a syncopal event with loss of consciousness. The patient's brother reported the patient was sitting, getting his hair cut when he fell over and became unresponsive for about 15 minutes. The patient denied experiencing any symptoms prior to falling over, no dizziness/lightheadness/prodromal symptoms. Only other complaint is mild pain to light palpation of abdomen which is chronically distended. Patient denies fever/chills/cough, does admit to diarrhea but states this is chronic. Patient denies that anything like this has ever happened to him before.  Admitted for septic shock secondary to UTI, required Levophed for many days, now blood pressure maintained on midodrine.  Low-dose Levophed was discontinued on 02/17/2021. Blood cultures remain negative.  Urine cultures more than 100,000 colonies of multiple species, no repeat.  CT abdomen with bilateral hydronephrosis, chronic cystitis and multiple bladder calculi, urology was consulted and they are not recommending any aggressive measures which includes nephrostomy tubes, stents.  Patient initially received cefepime, vancomycin and Flagyl and later transition to Zosyn and completed the course on 2/424/22  Also noted signs of chronic diverticulitis on CT abdomen with persistent severe diffuse colonic dilatation and transition point.  Also noted to have AKI with CKD stage IV secondary to sepsis, nephrology was consulted, now creatinine stable and close to his baseline.  Subjective: Patient was resting comfortably when seen today.  Able to eat his breakfast.  Denies any nausea or vomiting.  Continues to have loose watery bowel movements and rectal tube.  Slurred speech, unknown baseline. Later called by nursing staff that he is having some abdominal discomfort and  nausea.  Assessment & Plan:   Principal Problem:   Severe sepsis with septic shock (HCC) Active Problems:   Chronic combined systolic and diastolic CHF, NYHA class 2 (HCC)   CAD (coronary artery disease)   Anemia in chronic kidney disease   Acute renal failure with tubular necrosis (HCC)   Syncope   Hydronephrosis, bilateral   Hyperkalemia   Elevated troponin   UTI (urinary tract infection)   Cystitis  Severe sepsis with septic shock secondary to UTI.  Blood cultures remain negative, urine cultures with more than 100,000 colonies of multiple bacteria, no repeat cultures done.  Patient received broad-spectrum antibiotics and completed the course with Zosyn.  There was also some concern of chronic diverticulitis.  Currently having loose watery bowel movements, C. difficile colitis and GI pathogen testing were negative.  Apparently this is his chronic issue.  Blood pressure within acceptable range with midodrine, Levophed was discontinued yesterday. -Continue with supportive care -PT is recommending SNF placement. -Patient can be transferred out of ICU to [place].  AKI with CKD stage IV.  Creatinine appears to be at baseline.  Initial imaging with bilateral hydronephrosis and labs with metabolic acidosis which has been resolved.  Repeat renal ultrasound done yesterday was negative for any gross hydronephrosis but it was limited secondary to body habitus. Nephrology is following-appreciate their recommendations. Urology was also consulted but they were not recommending any intervention. -Monitor renal function -Avoid nephrotoxins  Anemia.  Hemoglobin at 7.1 today.  Anemia panel consistent with anemia of chronic disease with some iron deficiency, most likely secondary to his renal disease and worsened with acute illness with septic shock. Patient is Jehovah witness-do not want any blood transfusion. -Give him 1 dose of Feraheme -Continue to monitor -Might  get benefit with EPO  Diarrhea.   GI was consulted during his ICU stay.  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 They were recommending managed conservatively with electrolyte correction and avoiding Imodium. C. difficile and GI pathogen labs were negative. -Continue to monitor and replace electrolytes aggressively as needed.  Chronic HFrEF.  EF of 30 to 35% according to echo done in April 2021.  Patient was having lower extremity edema, chest was clear.  Blood pressure borderline for any active diuresis, -We will restart home dose of torsemide from tomorrow if blood pressure allows.  Hypothyroidism. -Continue home dose of Synthroid.  Objective: Vitals:   02/17/21 2000 February 22, 2021 0500 February 22, 2021 0700 February 22, 2021 0759  BP:   (!) 103/48   Pulse:   (!) 56 79  Resp:   10 18  Temp: 97.6 F (36.4 C)   98.1 F (36.7 C)  TempSrc: Axillary   Axillary  SpO2:   98% 98%  Weight:  83.5 kg    Height:        Intake/Output Summary (Last 24 hours) at 22-Feb-2021 0821 Last data filed at 02/17/2021 1803 Gross per 24 hour  Intake 2519.93 ml  Output 800 ml  Net 1719.93 ml   Filed Weights   02/16/21 0500 02/17/21 0429 02/22/21 0500  Weight: 83.5 kg 83.5 kg 83.5 kg    Examination:  General exam: Appears calm and comfortable  Respiratory system: Clear to auscultation. Respiratory effort normal. Cardiovascular system: S1 & S2 heard, RRR.  Gastrointestinal system: Soft, nontender, nondistended, bowel sounds positive. Central nervous system: Alert and oriented. No focal neurological deficits. Extremities: 1+ LE edema, no cyanosis, pulses intact and symmetrical. Psychiatry: Judgement and insight appear normal.     DVT prophylaxis: Subcu heparin Code Status: DNR Family Communication: Called brother with no response. Disposition Plan:  Status is: Inpatient  Remains inpatient appropriate because:Inpatient level of care appropriate due to severity of illness   Dispo: The  patient is from: Home              Anticipated d/c is to: SNF              Patient currently is not medically stable to d/c.   Difficult to place patient No              Level of care: Progressive Cardiac  All the records are reviewed and case discussed with Care Management/Social Worker. Management plans discussed with the patient, nursing and they are in agreement.  Consultants:   Nephrology  Urology  GI  Palliative care  Procedures:  Antimicrobials:   Data Reviewed: I have personally reviewed following labs and imaging studies  CBC: Recent Labs  Lab 02/13/21 0319 02/14/21 0419 02/15/21 0651 02/16/21 0513 02/17/21 0412  WBC 11.0* 11.6* 8.9 8.2 9.2  HGB 7.3* 7.2* 7.1* 7.0* 6.5*  HCT 23.8* 22.9* 23.5* 22.7* 22.0*  MCV 96.4 95.0 97.5 97.0 99.5  PLT 211 210 203 205 045   Basic Metabolic Panel: Recent Labs  Lab 02/14/21 0419 02/15/21 0651 02/16/21 0513 02/17/21 0412 02/22/21 0413  NA 140 140 138 140 143  K 4.4 4.9 4.8 5.0 4.9  CL 116* 116* 115* 116* 117*  CO2 18* 19* 18* 19* 21*  GLUCOSE 89 89 91 98 87  BUN 46* 45* 43* 42* 38*  CREATININE 2.88* 2.96* 2.98* 2.87* 2.60*  CALCIUM 8.2* 8.2* 8.1* 8.1* 8.5*  MG 1.6* 2.5* 2.0 2.0 1.9  PHOS 3.2 3.4 3.7 3.7  3.6   GFR: Estimated Creatinine Clearance: 25.3 mL/min (A) (by C-G formula based on SCr of 2.6 mg/dL (H)). Liver Function Tests: No results for input(s): AST, ALT, ALKPHOS, BILITOT, PROT, ALBUMIN in the last 168 hours. No results for input(s): LIPASE, AMYLASE in the last 168 hours. No results for input(s): AMMONIA in the last 168 hours. Coagulation Profile: No results for input(s): INR, PROTIME in the last 168 hours. Cardiac Enzymes: No results for input(s): CKTOTAL, CKMB, CKMBINDEX, TROPONINI in the last 168 hours. BNP (last 3 results) No results for input(s): PROBNP in the last 8760 hours. HbA1C: No results for input(s): HGBA1C in the last 72 hours. CBG: No results for input(s): GLUCAP in the last 168  hours. Lipid Profile: No results for input(s): CHOL, HDL, LDLCALC, TRIG, CHOLHDL, LDLDIRECT in the last 72 hours. Thyroid Function Tests: No results for input(s): TSH, T4TOTAL, FREET4, T3FREE, THYROIDAB in the last 72 hours. Anemia Panel: No results for input(s): VITAMINB12, FOLATE, FERRITIN, TIBC, IRON, RETICCTPCT in the last 72 hours. Sepsis Labs: No results for input(s): PROCALCITON, LATICACIDVEN in the last 168 hours.  Recent Results (from the past 240 hour(s))  Gastrointestinal Panel by PCR , Stool     Status: None   Collection Time: 02/10/21 12:36 PM   Specimen: Stool  Result Value Ref Range Status   Campylobacter species NOT DETECTED NOT DETECTED Final   Plesimonas shigelloides NOT DETECTED NOT DETECTED Final   Salmonella species NOT DETECTED NOT DETECTED Final   Yersinia enterocolitica NOT DETECTED NOT DETECTED Final   Vibrio species NOT DETECTED NOT DETECTED Final   Vibrio cholerae NOT DETECTED NOT DETECTED Final   Enteroaggregative E coli (EAEC) NOT DETECTED NOT DETECTED Final   Enteropathogenic E coli (EPEC) NOT DETECTED NOT DETECTED Final   Enterotoxigenic E coli (ETEC) NOT DETECTED NOT DETECTED Final   Shiga like toxin producing E coli (STEC) NOT DETECTED NOT DETECTED Final   Shigella/Enteroinvasive E coli (EIEC) NOT DETECTED NOT DETECTED Final   Cryptosporidium NOT DETECTED NOT DETECTED Final   Cyclospora cayetanensis NOT DETECTED NOT DETECTED Final   Entamoeba histolytica NOT DETECTED NOT DETECTED Final   Giardia lamblia NOT DETECTED NOT DETECTED Final   Adenovirus F40/41 NOT DETECTED NOT DETECTED Final   Astrovirus NOT DETECTED NOT DETECTED Final   Norovirus GI/GII NOT DETECTED NOT DETECTED Final   Rotavirus A NOT DETECTED NOT DETECTED Final   Sapovirus (I, II, IV, and V) NOT DETECTED NOT DETECTED Final    Comment: Performed at [facility], [address] [place], [place] [address]  C Difficile Quick Screen (NO PCR Reflex)     Status: None   Collection  Time: 02/10/21 12:36 PM   Specimen: STOOL  Result Value Ref Range Status   C Diff antigen NEGATIVE NEGATIVE Final   C Diff toxin NEGATIVE NEGATIVE Final   C Diff interpretation No C. difficile detected.  Final    Comment: Performed at [facility], [address] [place], [place] [address]     Radiology Studies: US RENAL  Result Date: 02/17/2021 CLINICAL DATA:  Acute renal failure, history CHF, non ischemic cardiomyopathy, coronary artery disease EXAM: RENAL / URINARY TRACT ULTRASOUND COMPLETE COMPARISON:  02/08/2021 FINDINGS: Right Kidney: Renal measurements: 8.5 x 4.0 x 4.6 cm = volume: 81 mL. Poorly visualized due to body habitus. Cortical thinning. No gross mass or hydronephrosis identified on limited assessment. Left Kidney: Renal measurements: 11.8 x 4.7 x 4.8 cm = volume: 138 mL. Poorly visualized due to body habitus.  Grossly normal appearance without mass or hydronephrosis Bladder: Not visualized, decompressed by Foley catheter Other: N/A IMPRESSION: Limited exam secondary to body habitus. No gross renal sonographic abnormality seen on limited assessment. Electronically Signed   By: Lavonia Dana M.D.   On: 02/17/2021 11:56   US Venous Img Lower Bilateral (DVT)  Result Date: 02/17/2021 CLINICAL DATA:  Bilateral lower extremity edema. EXAM: BILATERAL LOWER EXTREMITY VENOUS DOPPLER ULTRASOUND TECHNIQUE: Gray-scale sonography with graded compression, as well as color Doppler and duplex ultrasound were performed to evaluate the lower extremity deep venous systems from the level of the common femoral vein and including the common femoral, femoral, profunda femoral, popliteal and calf veins including the posterior tibial, peroneal and gastrocnemius veins when visible. The superficial great saphenous vein was also interrogated. Spectral Doppler was utilized to evaluate flow at rest and with distal augmentation maneuvers in the common femoral, femoral and popliteal veins. COMPARISON:  None.  FINDINGS: RIGHT LOWER EXTREMITY Common Femoral Vein: No evidence of thrombus. Normal compressibility, respiratory phasicity and response to augmentation. Saphenofemoral Junction: No evidence of thrombus. Normal compressibility and flow on color Doppler imaging. Profunda Femoral Vein: No evidence of thrombus. Normal compressibility and flow on color Doppler imaging. Femoral Vein: No evidence of thrombus. Normal compressibility, respiratory phasicity and response to augmentation. Popliteal Vein: No evidence of thrombus. Normal compressibility, respiratory phasicity and response to augmentation. Calf Veins: No evidence of thrombus. Normal compressibility and flow on color Doppler imaging. Superficial Great Saphenous Vein: No evidence of thrombus. Normal compressibility. Venous Reflux:  None. Other Findings: No evidence of superficial thrombophlebitis or abnormal fluid collection. LEFT LOWER EXTREMITY Common Femoral Vein: No evidence of thrombus. Normal compressibility, respiratory phasicity and response to augmentation. Saphenofemoral Junction: No evidence of thrombus. Normal compressibility and flow on color Doppler imaging. Profunda Femoral Vein: No evidence of thrombus. Normal compressibility and flow on color Doppler imaging. Femoral Vein: No evidence of thrombus. Normal compressibility, respiratory phasicity and response to augmentation. Popliteal Vein: No evidence of thrombus. Normal compressibility, respiratory phasicity and response to augmentation. Calf Veins: No evidence of thrombus. Normal compressibility and flow on color Doppler imaging. Superficial Great Saphenous Vein: No evidence of thrombus. Normal compressibility. Venous Reflux:  None. Other Findings: No evidence of superficial thrombophlebitis or abnormal fluid collection. IMPRESSION: No evidence of deep venous thrombosis in either lower extremity. Electronically Signed   By: Aletta Edouard M.D.   On: 02/17/2021 09:32    Scheduled Meds: . sodium  chloride   Intravenous Once  . Chlorhexidine Gluconate Cloth  6 each Topical Daily  . levothyroxine  12.5 mcg Intravenous Daily  . midodrine  10 mg Oral TID WC  . psyllium  1 packet Oral Daily  . sodium bicarbonate  650 mg Oral TID   Continuous Infusions: . sodium chloride Stopped (02/16/21 0524)  . sodium chloride       LOS: 11 days   Time spent: 45 minutes. More than 50% of the time was spent in counseling/coordination of care  Lorella Nimrod, MD [facility]  If 7PM-7AM, please contact night-coverage [email or web address]  Feb 21, 2021, 8:21 AM   This record has been created using Systems analyst. Errors have been sought and corrected,but may not always be located. Such creation errors do not reflect on the standard of care.

## 2021-02-21 NOTE — Progress Notes (Signed)
[facility]  ROUNDING NOTE   Subjective:   Hospital Course: admitted on 2/15 for near syncope  Patient was seen in ICU today Patient resting comfortably in the bed Patient voices no new specific concerns     Objective:  Vital signs in last 24 hours:  Temp:  [97.6 F (36.4 C)-98.1 F (36.7 C)] 97.7 F (36.5 C) (02/26 1655) Pulse Rate:  [56-105] 101 (02/26 1400) Resp:  [10-32] 32 (02/26 1700) BP: (103-139)/(48-114) 139/68 (02/26 1600) SpO2:  [91 %-98 %] 95 % (02/26 1500) Weight:  [83.5 kg] 83.5 kg (02/26 0500)  Weight change: 0 kg Filed Weights   02/16/21 0500 02/17/21 0429 Mar 20, 2021 0500  Weight: 83.5 kg 83.5 kg 83.5 kg    Intake/Output: I/O last 3 completed shifts: In: 2519.9 [I.V.:2519.9] Out: 1600 [Stool:1600]   Intake/Output this shift:  Total I/O In: 100 [IV Piggyback:100] Out: 7209 [Urine:475; Stool:900]  Physical Exam: General: Ill appearing  Head: Normocephalic, atraumatic. Moist oral mucosal membranes  Lungs:  Clear to auscultation  Heart: Regular rate and rhythm  Abdomen:  Soft, nontender, +distended, tympanic  Extremities:  + dependent peripheral edema. Scrotal edema  Neurologic: Nonfocal, moving all four extremities  Skin: Edema blisters  Access:   Rectal tube in situ    Basic Metabolic Panel: Recent Labs  Lab 02/14/21 0419 02/15/21 0651 02/16/21 0513 02/17/21 0412 2021/03/20 0413  NA 140 140 138 140 143  K 4.4 4.9 4.8 5.0 4.9  CL 116* 116* 115* 116* 117*  CO2 18* 19* 18* 19* 21*  GLUCOSE 89 89 91 98 87  BUN 46* 45* 43* 42* 38*  CREATININE 2.88* 2.96* 2.98* 2.87* 2.60*  CALCIUM 8.2* 8.2* 8.1* 8.1* 8.5*  MG 1.6* 2.5* 2.0 2.0 1.9  PHOS 3.2 3.4 3.7 3.7 3.6    Creat trend 2022 4.9==>2.6 2021 1.3--2.7 2020  0.9--1.2 2019  1.0--1.3 2017   1.2--1.4    CBC: Recent Labs  Lab 02/14/21 0419 02/15/21 0651 02/16/21 0513 02/17/21 0412 Mar 20, 2021 0854  WBC 11.6* 8.9 8.2 9.2 9.6  HGB 7.2* 7.1* 7.0* 6.5* 7.1*  HCT 22.9*  23.5* 22.7* 22.0* 23.9*  MCV 95.0 97.5 97.0 99.5 98.8  PLT 210 203 205 204 226    Cardiac Enzymes: No results for input(s): CKTOTAL, CKMB, CKMBINDEX, TROPONINI in the last 168 hours.  BNP: Invalid input(s): POCBNP  CBG: No results for input(s): GLUCAP in the last 168 hours.  Microbiology: Results for orders placed or performed during the hospital encounter of 01/26/2021  Culture, blood (routine x 2)     Status: None   Collection Time: 02/01/2021 10:29 AM   Specimen: BLOOD  Result Value Ref Range Status   Specimen Description BLOOD LEFT ANTECUBITAL  Final   Special Requests   Final    BOTTLES DRAWN AEROBIC AND ANAEROBIC Blood Culture results may not be optimal due to an excessive volume of blood received in culture bottles   Culture   Final    NO GROWTH 5 DAYS Performed at [facility], [address], [place], [place] [address]    Report Status 02/12/2021 FINAL  Final  Culture, blood (routine x 2)     Status: None   Collection Time: 02/06/2021 10:30 AM   Specimen: BLOOD  Result Value Ref Range Status   Specimen Description BLOOD BLOOD LEFT FOREARM  Final   Special Requests   Final    BOTTLES DRAWN AEROBIC ONLY Blood Culture adequate volume   Culture   Final    NO  GROWTH 5 DAYS Performed at [facility], [address] [place], [place] [address]    Report Status 02/12/2021 FINAL  Final  SARS CORONAVIRUS 2 (TAT 6-24 HRS) Nasopharyngeal Nasopharyngeal Swab     Status: None   Collection Time: 02/06/2021 12:09 PM   Specimen: Nasopharyngeal Swab  Result Value Ref Range Status   SARS Coronavirus 2 NEGATIVE NEGATIVE Final    Comment: (NOTE) SARS-CoV-2 target nucleic acids are NOT DETECTED.  The SARS-CoV-2 RNA is generally detectable in upper and lower respiratory specimens during the acute phase of infection. Negative results do not preclude SARS-CoV-2 infection, do not rule out co-infections with other pathogens, and should not be used as the sole basis for  treatment or other patient management decisions. Negative results must be combined with clinical observations, patient history, and epidemiological information. The expected result is Negative.  Fact Sheet for Patients: SugarRoll.be  Fact Sheet for Healthcare Providers: https://www.woods-mathews.com/  This test is not yet approved or cleared by the [place] FDA and  has been authorized for detection and/or diagnosis of SARS-CoV-2 by FDA under an Emergency Use Authorization (EUA). This EUA will remain  in effect (meaning this test can be used) for the duration of the COVID-19 declaration under Se ction 564(b)(1) of the Act, 21 U.S.C. section 360bbb-3(b)(1), unless the authorization is terminated or revoked sooner.  Performed at [facility], [place] [address] [place], [place] [address]   Urine Culture     Status: Abnormal   Collection Time: 02/14/2021  4:16 PM   Specimen: Urine, Random  Result Value Ref Range Status   Specimen Description   Final    URINE, RANDOM Performed at [facility], [address], [place], [place] [address]    Special Requests   Final    NONE Performed at [facility], [address] [place], [place] [address]    Culture (A)  Final    >=100,000 COLONIES/mL MULTIPLE SPECIES PRESENT, SUGGEST RECOLLECTION   Report Status 02/09/2021 FINAL  Final  MRSA PCR Screening     Status: None   Collection Time: 02/08/21  6:30 AM   Specimen: Nasopharyngeal  Result Value Ref Range Status   MRSA by PCR NEGATIVE NEGATIVE Final    Comment:        The GeneXpert MRSA Assay (FDA approved for NASAL specimens only), is one component of a comprehensive MRSA colonization surveillance program. It is not intended to diagnose MRSA infection nor to guide or monitor treatment for MRSA infections. Performed at [facility], [address] [place], [place] [address]   Gastrointestinal Panel by  PCR , Stool     Status: None   Collection Time: 02/10/21 12:36 PM   Specimen: Stool  Result Value Ref Range Status   Campylobacter species NOT DETECTED NOT DETECTED Final   Plesimonas shigelloides NOT DETECTED NOT DETECTED Final   Salmonella species NOT DETECTED NOT DETECTED Final   Yersinia enterocolitica NOT DETECTED NOT DETECTED Final   Vibrio species NOT DETECTED NOT DETECTED Final   Vibrio cholerae NOT DETECTED NOT DETECTED Final   Enteroaggregative E coli (EAEC) NOT DETECTED NOT DETECTED Final   Enteropathogenic E coli (EPEC) NOT DETECTED NOT DETECTED Final   Enterotoxigenic E coli (ETEC) NOT DETECTED NOT DETECTED Final   Shiga like toxin producing E coli (STEC) NOT DETECTED NOT DETECTED Final   Shigella/Enteroinvasive E coli (EIEC) NOT DETECTED NOT DETECTED Final   Cryptosporidium NOT DETECTED NOT DETECTED Final   Cyclospora cayetanensis  NOT DETECTED NOT DETECTED Final   Entamoeba histolytica NOT DETECTED NOT DETECTED Final   Giardia lamblia NOT DETECTED NOT DETECTED Final   Adenovirus F40/41 NOT DETECTED NOT DETECTED Final   Astrovirus NOT DETECTED NOT DETECTED Final   Norovirus GI/GII NOT DETECTED NOT DETECTED Final   Rotavirus A NOT DETECTED NOT DETECTED Final   Sapovirus (I, II, IV, and V) NOT DETECTED NOT DETECTED Final    Comment: Performed at [facility], [address], [place], [place] [address]  C Difficile Quick Screen (NO PCR Reflex)     Status: None   Collection Time: 02/10/21 12:36 PM   Specimen: STOOL  Result Value Ref Range Status   C Diff antigen NEGATIVE NEGATIVE Final   C Diff toxin NEGATIVE NEGATIVE Final   C Diff interpretation No C. difficile detected.  Final    Comment: Performed at [facility], [address] [place], [place] [address]    Coagulation Studies: No results for input(s): LABPROT, INR in the last 72 hours.  Urinalysis: No results for input(s): COLORURINE, LABSPEC, PHURINE, GLUCOSEU, HGBUR, BILIRUBINUR, KETONESUR,  PROTEINUR, UROBILINOGEN, NITRITE, LEUKOCYTESUR in the last 72 hours.  Invalid input(s): APPERANCEUR    Imaging: US RENAL  Result Date: 02/17/2021 CLINICAL DATA:  Acute renal failure, history CHF, non ischemic cardiomyopathy, coronary artery disease EXAM: RENAL / URINARY TRACT ULTRASOUND COMPLETE COMPARISON:  02/08/2021 FINDINGS: Right Kidney: Renal measurements: 8.5 x 4.0 x 4.6 cm = volume: 81 mL. Poorly visualized due to body habitus. Cortical thinning. No gross mass or hydronephrosis identified on limited assessment. Left Kidney: Renal measurements: 11.8 x 4.7 x 4.8 cm = volume: 138 mL. Poorly visualized due to body habitus. Grossly normal appearance without mass or hydronephrosis Bladder: Not visualized, decompressed by Foley catheter Other: N/A IMPRESSION: Limited exam secondary to body habitus. No gross renal sonographic abnormality seen on limited assessment. Electronically Signed   By: Lavonia Dana M.D.   On: 02/17/2021 11:56   US Venous Img Lower Bilateral (DVT)  Result Date: 02/17/2021 CLINICAL DATA:  Bilateral lower extremity edema. EXAM: BILATERAL LOWER EXTREMITY VENOUS DOPPLER ULTRASOUND TECHNIQUE: Gray-scale sonography with graded compression, as well as color Doppler and duplex ultrasound were performed to evaluate the lower extremity deep venous systems from the level of the common femoral vein and including the common femoral, femoral, profunda femoral, popliteal and calf veins including the posterior tibial, peroneal and gastrocnemius veins when visible. The superficial great saphenous vein was also interrogated. Spectral Doppler was utilized to evaluate flow at rest and with distal augmentation maneuvers in the common femoral, femoral and popliteal veins. COMPARISON:  None. FINDINGS: RIGHT LOWER EXTREMITY Common Femoral Vein: No evidence of thrombus. Normal compressibility, respiratory phasicity and response to augmentation. Saphenofemoral Junction: No evidence of thrombus. Normal  compressibility and flow on color Doppler imaging. Profunda Femoral Vein: No evidence of thrombus. Normal compressibility and flow on color Doppler imaging. Femoral Vein: No evidence of thrombus. Normal compressibility, respiratory phasicity and response to augmentation. Popliteal Vein: No evidence of thrombus. Normal compressibility, respiratory phasicity and response to augmentation. Calf Veins: No evidence of thrombus. Normal compressibility and flow on color Doppler imaging. Superficial Great Saphenous Vein: No evidence of thrombus. Normal compressibility. Venous Reflux:  None. Other Findings: No evidence of superficial thrombophlebitis or abnormal fluid collection. LEFT LOWER EXTREMITY Common Femoral Vein: No evidence of thrombus. Normal compressibility, respiratory phasicity and response to augmentation. Saphenofemoral Junction: No evidence of thrombus. Normal compressibility and flow on color Doppler imaging. Profunda Femoral Vein:  No evidence of thrombus. Normal compressibility and flow on color Doppler imaging. Femoral Vein: No evidence of thrombus. Normal compressibility, respiratory phasicity and response to augmentation. Popliteal Vein: No evidence of thrombus. Normal compressibility, respiratory phasicity and response to augmentation. Calf Veins: No evidence of thrombus. Normal compressibility and flow on color Doppler imaging. Superficial Great Saphenous Vein: No evidence of thrombus. Normal compressibility. Venous Reflux:  None. Other Findings: No evidence of superficial thrombophlebitis or abnormal fluid collection. IMPRESSION: No evidence of deep venous thrombosis in either lower extremity. Electronically Signed   By: Aletta Edouard M.D.   On: 02/17/2021 09:32     Medications:   . sodium chloride Stopped (02/16/21 0524)  . sodium chloride     . sodium chloride   Intravenous Once  . alum & mag hydroxide-simeth  30 mL Oral Once   And  . lidocaine  15 mL Oral Once  . Chlorhexidine  Gluconate Cloth  6 each Topical Daily  . levothyroxine  12.5 mcg Intravenous Daily  . midodrine  10 mg Oral TID WC  . pantoprazole  40 mg Oral Daily  . psyllium  1 packet Oral Daily  . sodium bicarbonate  650 mg Oral TID   acetaminophen, ondansetron (ZOFRAN) IV, oxyCODONE-acetaminophen, promethazine  Assessment/ Plan:  [name] is a 75 y.o. 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  This has been followed by urologist and general surgery from past 2 years.  Patient AKI is now better   2) hypotension  Blood pressure is stable    3)Anemia of chronic disease and anemia of iron deficiency  CBC Latest Ref Rng & Units March 07, 2021 02/17/2021 02/16/2021  WBC 4.0 - 10.5 K/uL 9.6 9.2 8.2  Hemoglobin 13.0 - 17.0 g/dL 7.1(L) 6.5(L) 7.0(L)  Hematocrit 39.0 - 52.0 % 23.9(L) 22.0(L) 22.7(L)  Platelets 150 - 400 K/uL 226 204 56      HGb is not at goal (9--11) Patient will benefit from IV iron replacement Hospitalist team is planning to give patient IV iron  4) Secondary hyperparathyroidism -CKD Mineral-Bone Disorder    Lab Results  Component Value Date   CALCIUM 8.5 (L) 03/07/21   PHOS 3.6 03-07-2021    Secondary Hyperparathyroidism absent Phosphorus at goal.   5) acute on chronic combined systolic and diastolic CHF  Left ventricle: The cavity size was at the upper limits of  normal. Wall thickness was normal. Systolic function was  moderately to severely reduced. The estimated ejection fraction  was in the range of 30% to 35%. Diffuse hypokinesis. Regional  wall motion abnormalities  cannot be excluded. Features are  consistent with a pseudonormal left ventricular filling pattern,  with concomitant abnormal relaxation and increased filling  pressure (grade 2 diastolic dysfunction). Doppler parameters are  consistent with high ventricular filling pressure.   Patient is currently not on any diuretics as patient was severely hypotensive earlier We will follow, when the patient blood pressure improved we will start patient on diuretics  6) Electrolytes   BMP Latest Ref Rng & Units 07-Mar-2021 02/17/2021 02/16/2021  Glucose 70 - 99 mg/dL 87 98 91  BUN 8 - 23  mg/dL 38(H) 42(H) 43(H)  Creatinine 0.61 - 1.24 mg/dL 2.60(H) 2.87(H) 2.98(H)  BUN/Creat Ratio 10 - 24 - - -  Sodium 135 - 145 mmol/L 143 140 138  Potassium 3.5 - 5.1 mmol/L 4.9 5.0 4.8  Chloride 98 - 111 mmol/L 117(H) 116(H) 115(H)  CO2 22 - 32 mmol/L 21(L) 19(L) 18(L)  Calcium 8.9 - 10.3 mg/dL 8.5(L) 8.1(L) 8.1(L)     Sodium Normonatremic   Potassium Normokalemic    7)Acid base  Patient had nonanion gap acidosis Co2 at goal    Plan  AKI is recovering We will continue current care for now Patient is to receive IV iron We will follow closely for the need of  diuresis    LOS: 11 William Holland s Lakishia Bourassa Mar 14, 20225:39 PM

## 2021-02-21 DEATH — deceased

## 2021-02-22 ENCOUNTER — Other Ambulatory Visit: Payer: Medicare Other | Admitting: Nurse Practitioner

## 2021-03-01 ENCOUNTER — Ambulatory Visit: Payer: Medicare Other | Admitting: Urology

## 2021-06-18 IMAGING — US US RENAL
1 series · 14 of 25 positions shown · non-contrast
Comparison: 02/08/2021

CLINICAL DATA: Acute renal failure, history CHF, non ischemic
cardiomyopathy, coronary artery disease

EXAM:
RENAL / URINARY TRACT ULTRASOUND COMPLETE

[Series 1: us renal · 0.32mm/px · 14 of 28 slices shown]
[im 1/28]
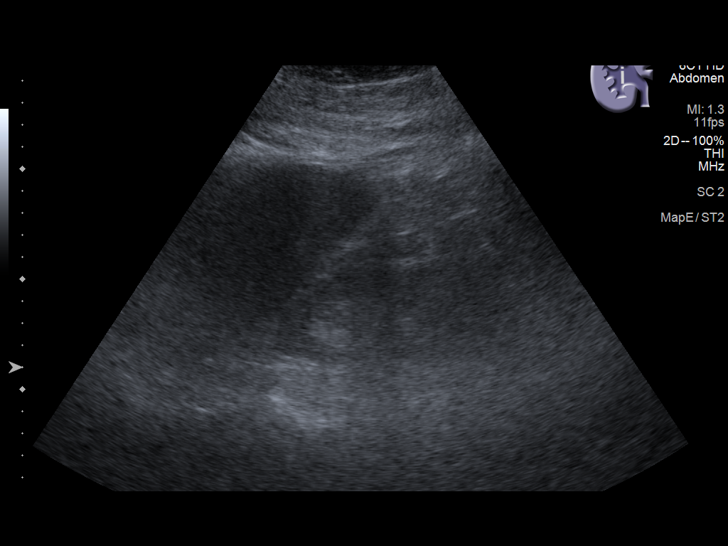
[im 3/28]
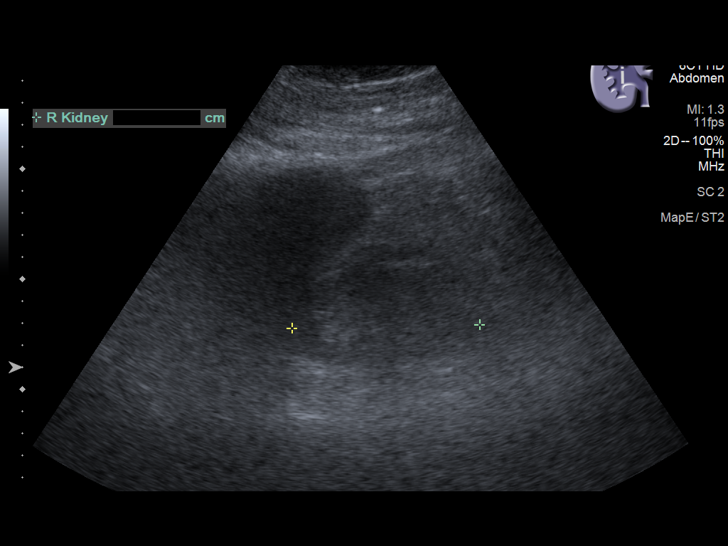
[im 5/28]
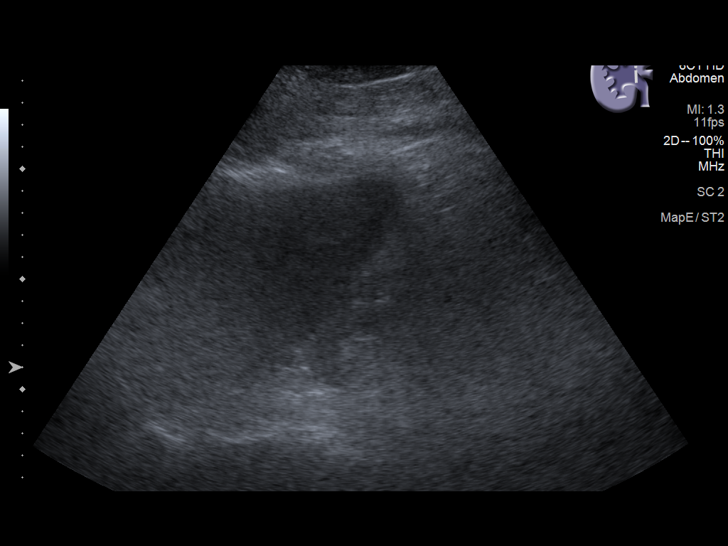
[im 7/28]
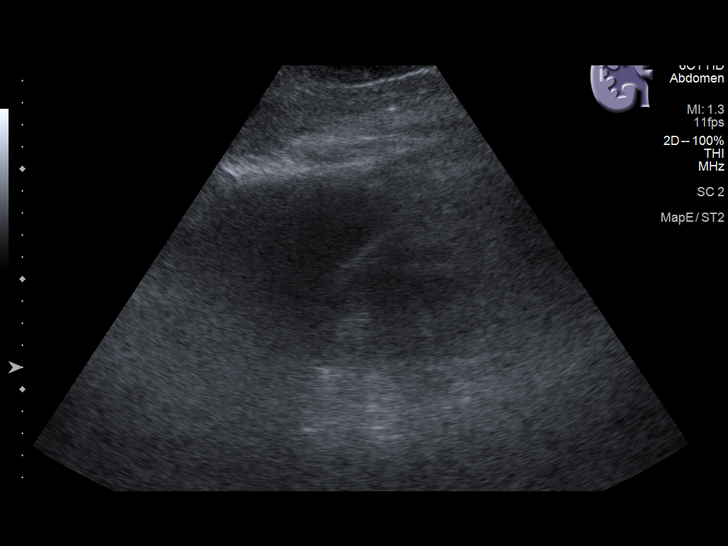
[im 10/28]
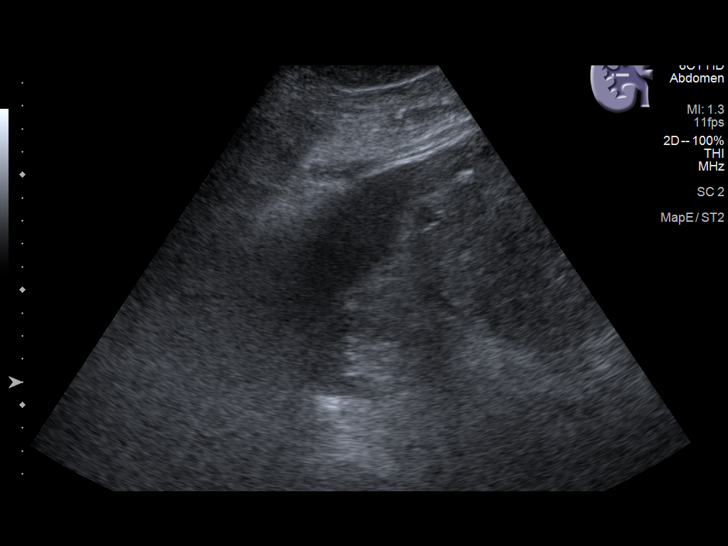
[im 11/28]
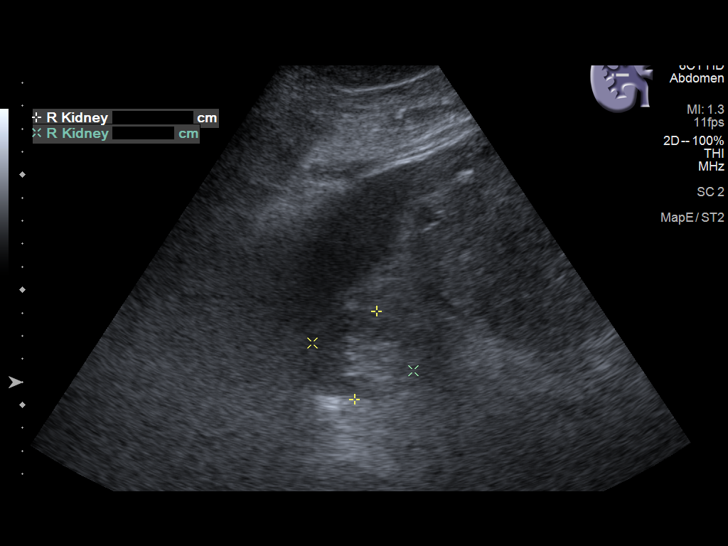
[im 13/28]
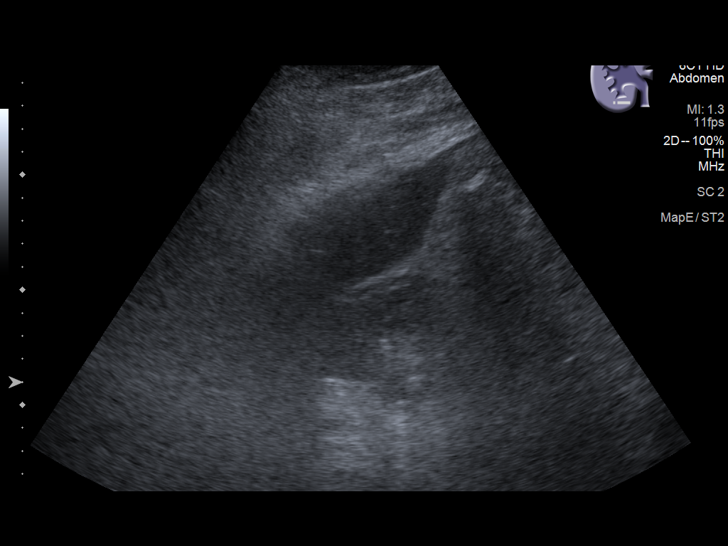
[im 15/28]
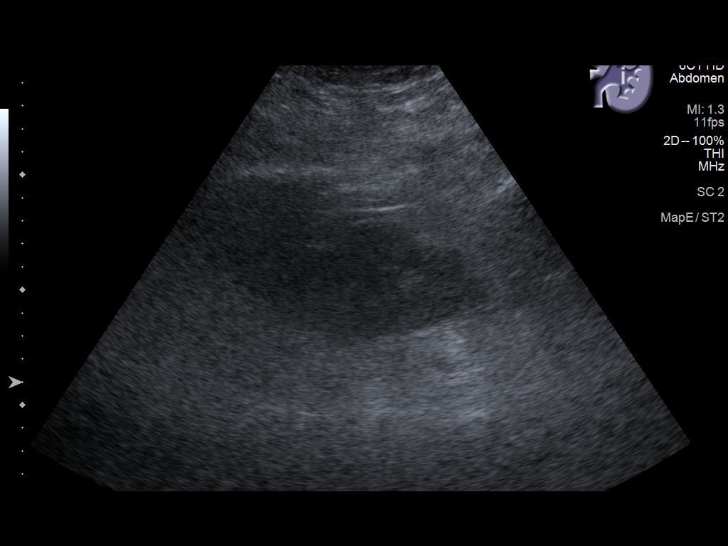
[im 17/28]
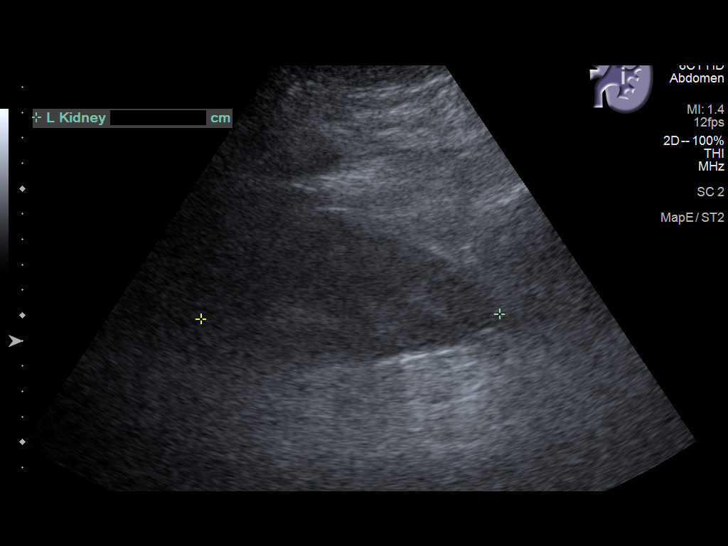
[im 19/28]
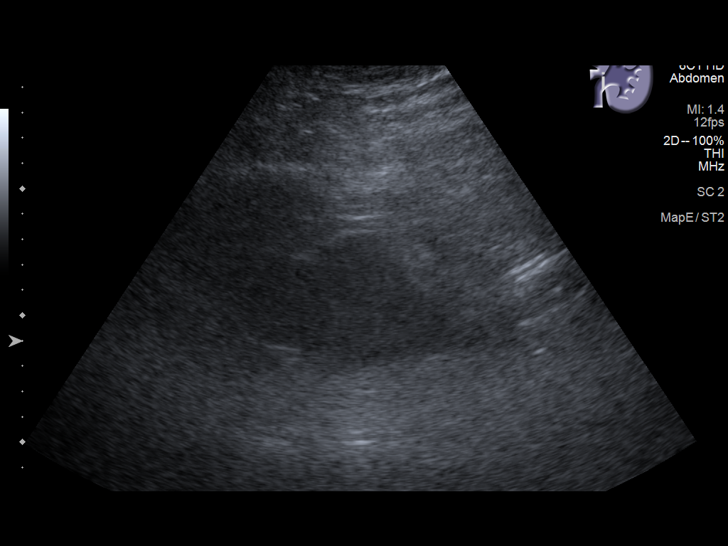
[im 21/28]
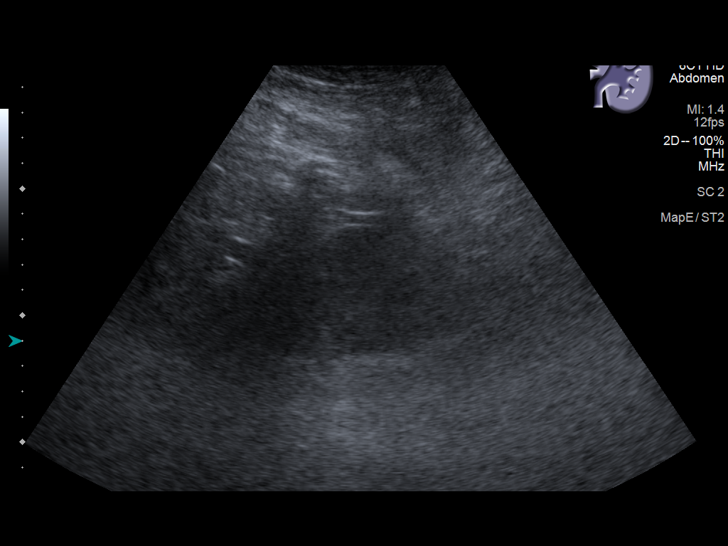
[im 23/28]
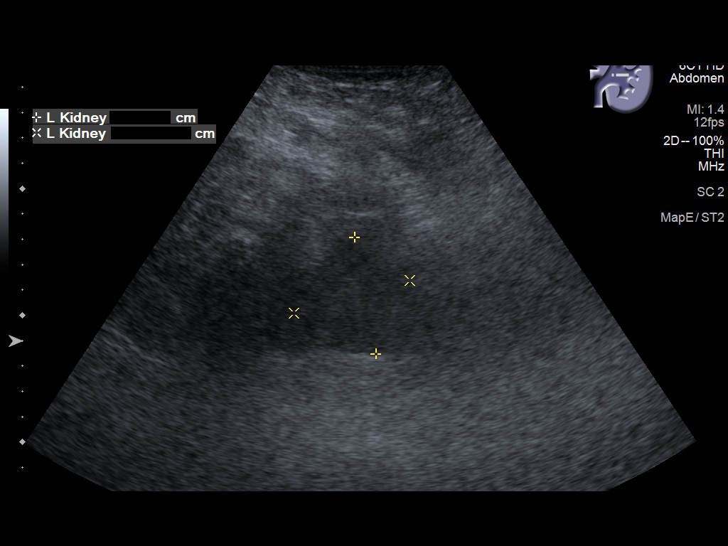
[im 25/28]
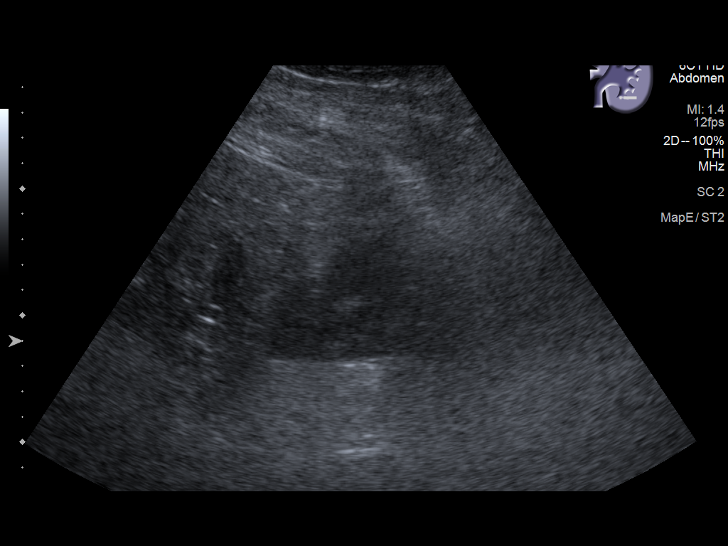
[im 28/28]
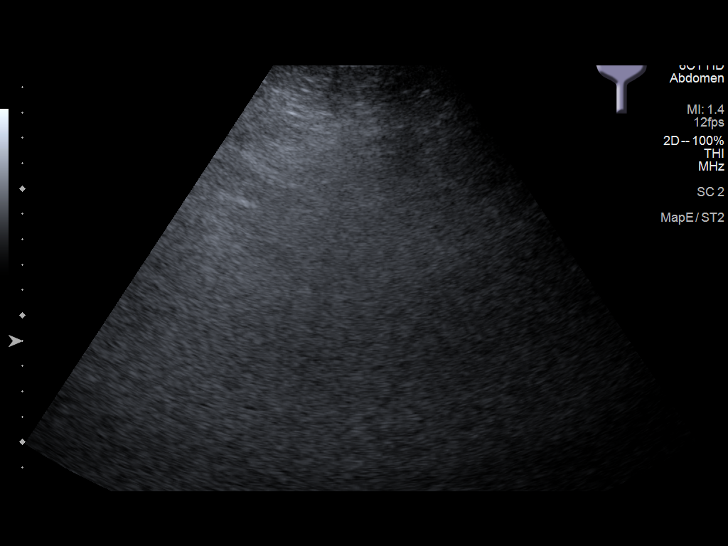

[14 of 25 positions shown; findings below may reference images not displayed]

FINDINGS: Right Kidney:

Renal measurements: 8.5 x 4.0 x 4.6 cm = volume: 81 mL. Poorly
visualized due to body habitus. Cortical thinning. No gross mass or
hydronephrosis identified on limited assessment.

Left Kidney:

Renal measurements: 11.8 x 4.7 x 4.8 cm = volume: 138 mL. Poorly
visualized due to body habitus. Grossly normal appearance without
mass or hydronephrosis

Bladder:

Not visualized, decompressed by Foley catheter

Other:

N/A
IMPRESSION: Limited exam secondary to body habitus.

No gross renal sonographic abnormality seen on limited assessment.

## 2021-06-19 IMAGING — DX DG CHEST 1V PORT
1 series · 1 of 1 positions shown · non-contrast
Comparison: February 09, 2021, same day abdominal radiograph

CLINICAL DATA: Tachypnea

EXAM:
PORTABLE CHEST 1 VIEW

[chest ap]
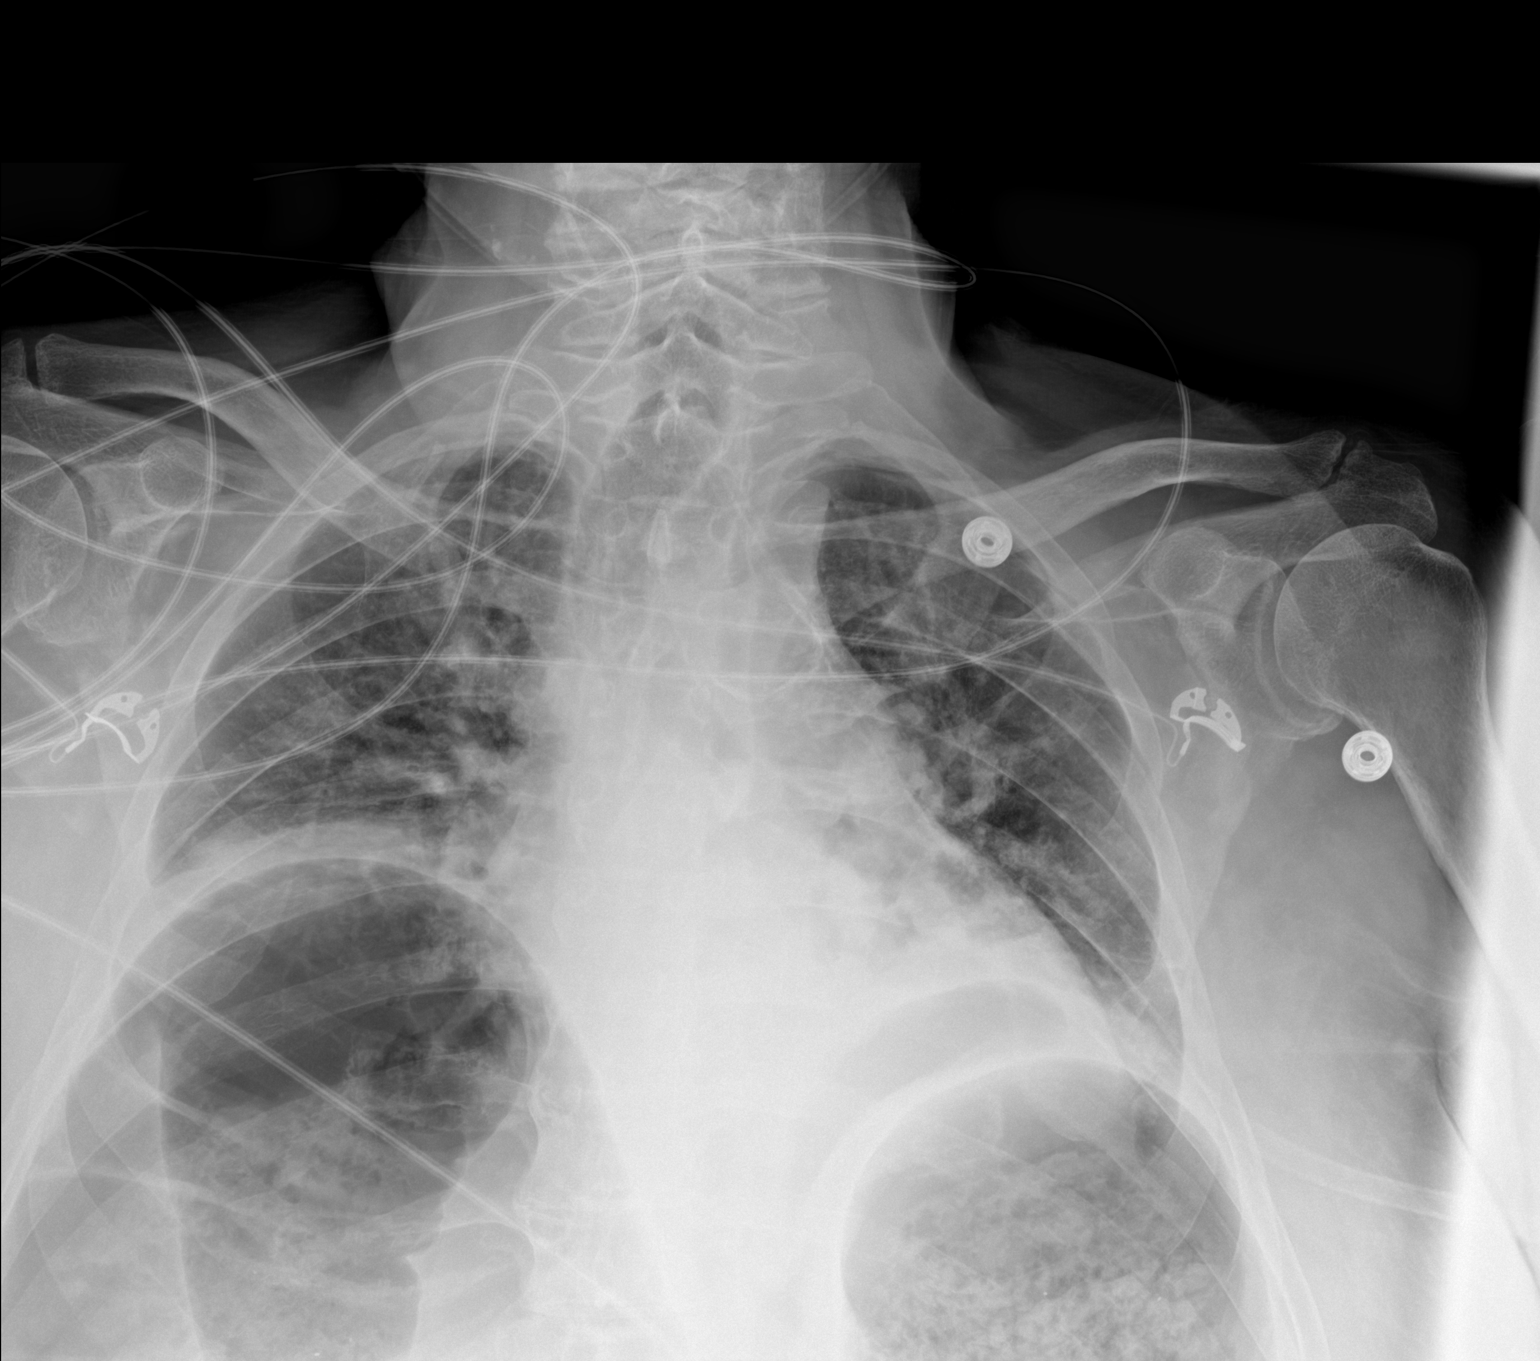

[1 of 1 positions shown; findings below may reference images not displayed]

FINDINGS: The cardiomediastinal silhouette is unchanged in contour. No pleural
effusion. No pneumothorax. Increased bibasilar predominately
platelike consolidative opacities. Multilevel degenerative changes
of the thoracic spine. There is marked gaseous dilation of colon
beneath the diaphragms.
IMPRESSION: 1. Increased bibasilar consolidative opacities most consistent with
atelectasis. Superimposed infection could present similarly.
2. Marked gaseous dilation of colon beneath the diaphragms.
